# Patient Record
Sex: Male | Born: 1965 | Race: White | Hispanic: No | Marital: Married | Smoking: Never smoker
Health system: Southern US, Community
[De-identification: ages and names within clinical notes are randomized; demographics above are authoritative.]

## PROBLEM LIST (undated history)

## (undated) DIAGNOSIS — E669 Obesity, unspecified: Secondary | ICD-10-CM

## (undated) DIAGNOSIS — I10 Essential (primary) hypertension: Secondary | ICD-10-CM

## (undated) DIAGNOSIS — I2510 Atherosclerotic heart disease of native coronary artery without angina pectoris: Secondary | ICD-10-CM

## (undated) DIAGNOSIS — I219 Acute myocardial infarction, unspecified: Secondary | ICD-10-CM

## (undated) DIAGNOSIS — E785 Hyperlipidemia, unspecified: Secondary | ICD-10-CM

## (undated) DIAGNOSIS — I639 Cerebral infarction, unspecified: Secondary | ICD-10-CM

## (undated) DIAGNOSIS — M1990 Unspecified osteoarthritis, unspecified site: Secondary | ICD-10-CM

## (undated) DIAGNOSIS — E119 Type 2 diabetes mellitus without complications: Secondary | ICD-10-CM

## (undated) DIAGNOSIS — I209 Angina pectoris, unspecified: Secondary | ICD-10-CM

## (undated) HISTORY — PX: SHOULDER SURGERY: SHX246

## (undated) HISTORY — PX: TONSILLECTOMY: SUR1361

## (undated) HISTORY — PX: EYE SURGERY: SHX253

---

## 2019-07-29 ENCOUNTER — Other Ambulatory Visit: Payer: Self-pay

## 2019-07-29 DIAGNOSIS — Z20822 Contact with and (suspected) exposure to covid-19: Secondary | ICD-10-CM

## 2019-07-31 LAB — NOVEL CORONAVIRUS, NAA: SARS-CoV-2, NAA: NOT DETECTED

## 2020-12-16 ENCOUNTER — Other Ambulatory Visit: Payer: Self-pay

## 2020-12-16 ENCOUNTER — Emergency Department (HOSPITAL_BASED_OUTPATIENT_CLINIC_OR_DEPARTMENT_OTHER): Payer: 59

## 2020-12-16 ENCOUNTER — Inpatient Hospital Stay (HOSPITAL_BASED_OUTPATIENT_CLINIC_OR_DEPARTMENT_OTHER)
Admission: EM | Admit: 2020-12-16 | Discharge: 2020-12-25 | DRG: 234 | Disposition: A | Discharge status: Home / Self Care | Payer: 59 | Attending: Surgery | Admitting: Surgery

## 2020-12-16 ENCOUNTER — Encounter (HOSPITAL_COMMUNITY)
Admission: EM | Disposition: A | Discharge status: Home / Self Care | Payer: Self-pay | Source: Home / Self Care | Attending: Surgery

## 2020-12-16 ENCOUNTER — Encounter (HOSPITAL_BASED_OUTPATIENT_CLINIC_OR_DEPARTMENT_OTHER): Payer: Self-pay

## 2020-12-16 DIAGNOSIS — I214 Non-ST elevation (NSTEMI) myocardial infarction: Secondary | ICD-10-CM | POA: Diagnosis not present

## 2020-12-16 DIAGNOSIS — Z20822 Contact with and (suspected) exposure to covid-19: Secondary | ICD-10-CM | POA: Diagnosis not present

## 2020-12-16 DIAGNOSIS — R7989 Other specified abnormal findings of blood chemistry: Secondary | ICD-10-CM

## 2020-12-16 DIAGNOSIS — E8881 Metabolic syndrome: Secondary | ICD-10-CM | POA: Diagnosis not present

## 2020-12-16 DIAGNOSIS — Z833 Family history of diabetes mellitus: Secondary | ICD-10-CM | POA: Diagnosis not present

## 2020-12-16 DIAGNOSIS — E785 Hyperlipidemia, unspecified: Secondary | ICD-10-CM | POA: Diagnosis not present

## 2020-12-16 DIAGNOSIS — D62 Acute posthemorrhagic anemia: Secondary | ICD-10-CM | POA: Diagnosis not present

## 2020-12-16 DIAGNOSIS — R079 Chest pain, unspecified: Secondary | ICD-10-CM

## 2020-12-16 DIAGNOSIS — Z7982 Long term (current) use of aspirin: Secondary | ICD-10-CM

## 2020-12-16 DIAGNOSIS — I2510 Atherosclerotic heart disease of native coronary artery without angina pectoris: Secondary | ICD-10-CM | POA: Diagnosis not present

## 2020-12-16 DIAGNOSIS — E782 Mixed hyperlipidemia: Secondary | ICD-10-CM

## 2020-12-16 DIAGNOSIS — R001 Bradycardia, unspecified: Secondary | ICD-10-CM | POA: Diagnosis not present

## 2020-12-16 DIAGNOSIS — E1065 Type 1 diabetes mellitus with hyperglycemia: Secondary | ICD-10-CM | POA: Diagnosis present

## 2020-12-16 DIAGNOSIS — I10 Essential (primary) hypertension: Secondary | ICD-10-CM

## 2020-12-16 DIAGNOSIS — Z888 Allergy status to other drugs, medicaments and biological substances status: Secondary | ICD-10-CM | POA: Diagnosis not present

## 2020-12-16 DIAGNOSIS — M25512 Pain in left shoulder: Secondary | ICD-10-CM | POA: Diagnosis not present

## 2020-12-16 DIAGNOSIS — I77810 Thoracic aortic ectasia: Secondary | ICD-10-CM | POA: Diagnosis present

## 2020-12-16 DIAGNOSIS — Z91013 Allergy to seafood: Secondary | ICD-10-CM | POA: Diagnosis not present

## 2020-12-16 DIAGNOSIS — E119 Type 2 diabetes mellitus without complications: Secondary | ICD-10-CM

## 2020-12-16 DIAGNOSIS — Z6841 Body Mass Index (BMI) 40.0 and over, adult: Secondary | ICD-10-CM | POA: Diagnosis not present

## 2020-12-16 DIAGNOSIS — Z951 Presence of aortocoronary bypass graft: Secondary | ICD-10-CM

## 2020-12-16 DIAGNOSIS — Z9641 Presence of insulin pump (external) (internal): Secondary | ICD-10-CM | POA: Diagnosis present

## 2020-12-16 DIAGNOSIS — Z79899 Other long term (current) drug therapy: Secondary | ICD-10-CM | POA: Diagnosis not present

## 2020-12-16 DIAGNOSIS — Z8249 Family history of ischemic heart disease and other diseases of the circulatory system: Secondary | ICD-10-CM

## 2020-12-16 DIAGNOSIS — Z7984 Long term (current) use of oral hypoglycemic drugs: Secondary | ICD-10-CM | POA: Diagnosis not present

## 2020-12-16 DIAGNOSIS — Z794 Long term (current) use of insulin: Secondary | ICD-10-CM

## 2020-12-16 DIAGNOSIS — I6523 Occlusion and stenosis of bilateral carotid arteries: Secondary | ICD-10-CM | POA: Diagnosis not present

## 2020-12-16 DIAGNOSIS — E1051 Type 1 diabetes mellitus with diabetic peripheral angiopathy without gangrene: Secondary | ICD-10-CM

## 2020-12-16 DIAGNOSIS — E1069 Type 1 diabetes mellitus with other specified complication: Secondary | ICD-10-CM | POA: Diagnosis not present

## 2020-12-16 DIAGNOSIS — R778 Other specified abnormalities of plasma proteins: Secondary | ICD-10-CM

## 2020-12-16 HISTORY — DX: Hyperlipidemia, unspecified: E78.5

## 2020-12-16 HISTORY — DX: Cerebral infarction, unspecified: I63.9

## 2020-12-16 HISTORY — DX: Type 2 diabetes mellitus without complications: E11.9

## 2020-12-16 HISTORY — DX: Essential (primary) hypertension: I10

## 2020-12-16 HISTORY — PX: LEFT HEART CATH AND CORONARY ANGIOGRAPHY: CATH118249

## 2020-12-16 HISTORY — DX: Obesity, unspecified: E66.9

## 2020-12-16 LAB — CBC
HCT: 44.8 % (ref 39.0–52.0)
Hemoglobin: 14.5 g/dL (ref 13.0–17.0)
MCH: 27.8 pg (ref 26.0–34.0)
MCHC: 32.4 g/dL (ref 30.0–36.0)
MCV: 85.8 fL (ref 80.0–100.0)
Platelets: 299 10*3/uL (ref 150–400)
RBC: 5.22 MIL/uL (ref 4.22–5.81)
RDW: 13.6 % (ref 11.5–15.5)
WBC: 11.3 10*3/uL — ABNORMAL HIGH (ref 4.0–10.5)
nRBC: 0 % (ref 0.0–0.2)

## 2020-12-16 LAB — BASIC METABOLIC PANEL
Anion gap: 13 (ref 5–15)
BUN: 28 mg/dL — ABNORMAL HIGH (ref 6–20)
CO2: 21 mmol/L — ABNORMAL LOW (ref 22–32)
Calcium: 9.9 mg/dL (ref 8.9–10.3)
Chloride: 104 mmol/L (ref 98–111)
Creatinine, Ser: 0.87 mg/dL (ref 0.61–1.24)
GFR, Estimated: >60 mL/min (ref >60)
Glucose, Bld: 249 mg/dL — ABNORMAL HIGH (ref 70–99)
Potassium: 3.9 mmol/L (ref 3.5–5.1)
Sodium: 138 mmol/L (ref 135–145)

## 2020-12-16 LAB — RESP PANEL BY RT-PCR (FLU A&B, COVID) ARPGX2
Influenza A by PCR: NEGATIVE
Influenza B by PCR: NEGATIVE
SARS Coronavirus 2 by RT PCR: NEGATIVE

## 2020-12-16 LAB — HIV ANTIBODY (ROUTINE TESTING W REFLEX): HIV Screen 4th Generation wRfx: NONREACTIVE

## 2020-12-16 LAB — CBG MONITORING, ED: Glucose-Capillary: 278 mg/dL — ABNORMAL HIGH (ref 70–99)

## 2020-12-16 LAB — HEMOGLOBIN A1C
Hgb A1c MFr Bld: 8.3 % — ABNORMAL HIGH (ref 4.8–5.6)
Mean Plasma Glucose: 191.51 mg/dL

## 2020-12-16 LAB — GLUCOSE, CAPILLARY
Glucose-Capillary: 270 mg/dL — ABNORMAL HIGH (ref 70–99)
Glucose-Capillary: 325 mg/dL — ABNORMAL HIGH (ref 70–99)

## 2020-12-16 LAB — TROPONIN I (HIGH SENSITIVITY)
Troponin I (High Sensitivity): 32 ng/L — ABNORMAL HIGH (ref <18)
Troponin I (High Sensitivity): 893 ng/L (ref <18)

## 2020-12-16 SURGERY — LEFT HEART CATH AND CORONARY ANGIOGRAPHY
Anesthesia: LOCAL

## 2020-12-16 MED ORDER — HEPARIN (PORCINE) 25000 UT/250ML-% IV SOLN
1300.0000 [IU]/h | INTRAVENOUS | Status: DC
  Administered 2020-12-16: 1300 [IU]/h via INTRAVENOUS
  Filled 2020-12-16: qty 250

## 2020-12-16 MED ORDER — HEPARIN BOLUS VIA INFUSION
4000.0000 [IU] | Freq: Once | INTRAVENOUS | Status: AC
  Administered 2020-12-16: 4000 [IU] via INTRAVENOUS

## 2020-12-16 MED ORDER — SODIUM CHLORIDE 0.9% FLUSH: 3.0000 mL | INTRAVENOUS | Status: DC | PRN

## 2020-12-16 MED ORDER — ATORVASTATIN CALCIUM 40 MG PO TABS: 40.0000 mg | ORAL_TABLET | Freq: Every day | ORAL | Status: DC

## 2020-12-16 MED ORDER — MIDAZOLAM HCL 2 MG/2ML IJ SOLN
INTRAMUSCULAR | Status: DC | PRN
  Administered 2020-12-16: 2 mg via INTRAVENOUS

## 2020-12-16 MED ORDER — ASPIRIN EC 325 MG PO TBEC
325.0000 mg | DELAYED_RELEASE_TABLET | Freq: Once | ORAL | Status: DC
  Filled 2020-12-16: qty 1

## 2020-12-16 MED ORDER — SODIUM CHLORIDE 0.9% FLUSH: 3.0000 mL | Freq: Two times a day (BID) | INTRAVENOUS | Status: DC

## 2020-12-16 MED ORDER — ASPIRIN EC 81 MG PO TBEC: 81.0000 mg | DELAYED_RELEASE_TABLET | Freq: Every day | ORAL | Status: DC

## 2020-12-16 MED ORDER — SODIUM CHLORIDE 0.9 % IV SOLN: 250.0000 mL | INTRAVENOUS | Status: DC | PRN

## 2020-12-16 MED ORDER — SODIUM CHLORIDE 0.9 % WEIGHT BASED INFUSION: 3.0000 mL/kg/h | INTRAVENOUS | Status: DC

## 2020-12-16 MED ORDER — ONDANSETRON HCL 4 MG/2ML IJ SOLN: 4.0000 mg | Freq: Four times a day (QID) | INTRAMUSCULAR | Status: DC | PRN

## 2020-12-16 MED ORDER — IOHEXOL 350 MG/ML SOLN
INTRAVENOUS | Status: DC | PRN
  Administered 2020-12-16: 55 mL

## 2020-12-16 MED ORDER — ACETAMINOPHEN 325 MG PO TABS: 650.0000 mg | ORAL_TABLET | ORAL | Status: DC | PRN

## 2020-12-16 MED ORDER — HEPARIN (PORCINE) IN NACL 1000-0.9 UT/500ML-% IV SOLN
INTRAVENOUS | Status: DC | PRN
  Administered 2020-12-16 (×2): 500 mL

## 2020-12-16 MED ORDER — VERAPAMIL HCL 2.5 MG/ML IV SOLN
INTRAVENOUS | Status: DC | PRN
  Administered 2020-12-16: 10 mL via INTRA_ARTERIAL

## 2020-12-16 MED ORDER — HEPARIN (PORCINE) IN NACL 1000-0.9 UT/500ML-% IV SOLN
INTRAVENOUS | Status: AC
  Filled 2020-12-16: qty 1000

## 2020-12-16 MED ORDER — ASPIRIN 81 MG PO CHEW
CHEWABLE_TABLET | ORAL | Status: AC
  Administered 2020-12-16: 243 mg via ORAL
  Filled 2020-12-16: qty 3

## 2020-12-16 MED ORDER — FENTANYL CITRATE (PF) 100 MCG/2ML IJ SOLN
INTRAMUSCULAR | Status: AC
  Filled 2020-12-16: qty 2

## 2020-12-16 MED ORDER — ASPIRIN 81 MG PO CHEW: 243.0000 mg | CHEWABLE_TABLET | Freq: Once | ORAL | Status: AC

## 2020-12-16 MED ORDER — INSULIN ASPART 100 UNIT/ML IJ SOLN: 0.0000 [IU] | Freq: Three times a day (TID) | INTRAMUSCULAR | Status: DC

## 2020-12-16 MED ORDER — MIDAZOLAM HCL 2 MG/2ML IJ SOLN
INTRAMUSCULAR | Status: AC
  Filled 2020-12-16: qty 2

## 2020-12-16 MED ORDER — LIDOCAINE HCL (PF) 1 % IJ SOLN
INTRAMUSCULAR | Status: AC
  Filled 2020-12-16: qty 30

## 2020-12-16 MED ORDER — INSULIN ASPART 100 UNIT/ML IJ SOLN
0.0000 [IU] | Freq: Three times a day (TID) | INTRAMUSCULAR | Status: DC
  Administered 2020-12-16: 8 [IU] via SUBCUTANEOUS

## 2020-12-16 MED ORDER — LIDOCAINE HCL (PF) 1 % IJ SOLN
INTRAMUSCULAR | Status: DC | PRN
Start: 2020-12-16 — End: 2020-12-16
  Administered 2020-12-16: 3 mL

## 2020-12-16 MED ORDER — INSULIN PUMP
Freq: Three times a day (TID) | SUBCUTANEOUS | Status: DC
  Filled 2020-12-16: qty 1

## 2020-12-16 MED ORDER — FENTANYL CITRATE (PF) 100 MCG/2ML IJ SOLN
INTRAMUSCULAR | Status: DC | PRN
  Administered 2020-12-16: 50 ug via INTRAVENOUS

## 2020-12-16 MED ORDER — HEPARIN (PORCINE) 25000 UT/250ML-% IV SOLN
2200.0000 [IU]/h | INTRAVENOUS | Status: DC
  Administered 2020-12-17: 1600 [IU]/h via INTRAVENOUS
  Administered 2020-12-17: 1300 [IU]/h via INTRAVENOUS
  Administered 2020-12-18 – 2020-12-19 (×4): 2200 [IU]/h via INTRAVENOUS
  Filled 2020-12-16 (×6): qty 250

## 2020-12-16 MED ORDER — NITROGLYCERIN 2 % TD OINT
0.5000 [in_us] | TOPICAL_OINTMENT | Freq: Once | TRANSDERMAL | Status: AC
  Administered 2020-12-16: 0.5 [in_us] via TOPICAL
  Filled 2020-12-16: qty 1

## 2020-12-16 MED ORDER — ASPIRIN EC 81 MG PO TBEC
81.0000 mg | DELAYED_RELEASE_TABLET | Freq: Every day | ORAL | Status: DC
  Administered 2020-12-17 – 2020-12-19 (×3): 81 mg via ORAL
  Filled 2020-12-16 (×3): qty 1

## 2020-12-16 MED ORDER — SODIUM CHLORIDE 0.9 % IV SOLN
INTRAVENOUS | Status: DC | PRN
  Administered 2020-12-16: 1000 mL via INTRAVENOUS

## 2020-12-16 MED ORDER — HEPARIN SODIUM (PORCINE) 1000 UNIT/ML IJ SOLN
INTRAMUSCULAR | Status: DC | PRN
  Administered 2020-12-16: 7000 [IU] via INTRAVENOUS

## 2020-12-16 MED ORDER — HYDRALAZINE HCL 20 MG/ML IJ SOLN: 10.0000 mg | INTRAMUSCULAR | Status: AC | PRN

## 2020-12-16 MED ORDER — ACETAMINOPHEN 325 MG PO TABS
650.0000 mg | ORAL_TABLET | ORAL | Status: DC | PRN
  Filled 2020-12-16: qty 2

## 2020-12-16 MED ORDER — ASPIRIN 81 MG PO CHEW: 81.0000 mg | CHEWABLE_TABLET | Freq: Every day | ORAL | Status: DC

## 2020-12-16 MED ORDER — HEPARIN SODIUM (PORCINE) 1000 UNIT/ML IJ SOLN
INTRAMUSCULAR | Status: AC
  Filled 2020-12-16: qty 1

## 2020-12-16 MED ORDER — HEPARIN (PORCINE) 25000 UT/250ML-% IV SOLN: 12.0000 [IU]/kg/h | INTRAVENOUS | Status: DC

## 2020-12-16 MED ORDER — ASPIRIN 81 MG PO TBEC: 81.0000 mg | DELAYED_RELEASE_TABLET | Freq: Every day | ORAL | Status: DC

## 2020-12-16 MED ORDER — ASPIRIN 81 MG PO CHEW: 81.0000 mg | CHEWABLE_TABLET | ORAL | Status: DC

## 2020-12-16 MED ORDER — SODIUM CHLORIDE 0.9 % WEIGHT BASED INFUSION: 1.0000 mL/kg/h | INTRAVENOUS | Status: DC

## 2020-12-16 MED ORDER — VERAPAMIL HCL 2.5 MG/ML IV SOLN
INTRAVENOUS | Status: AC
  Filled 2020-12-16: qty 2

## 2020-12-16 MED ORDER — LABETALOL HCL 5 MG/ML IV SOLN: 10.0000 mg | INTRAVENOUS | Status: AC | PRN

## 2020-12-16 MED ORDER — INSULIN ASPART 100 UNIT/ML IJ SOLN
0.0000 [IU] | Freq: Every day | INTRAMUSCULAR | Status: DC
  Administered 2020-12-16: 5 [IU] via SUBCUTANEOUS
  Administered 2020-12-17: 3 [IU] via SUBCUTANEOUS

## 2020-12-16 MED ORDER — NITROGLYCERIN 0.4 MG SL SUBL
0.4000 mg | SUBLINGUAL_TABLET | SUBLINGUAL | Status: DC | PRN
  Administered 2020-12-18 – 2020-12-19 (×3): 0.4 mg via SUBLINGUAL
  Filled 2020-12-16: qty 1

## 2020-12-16 MED ORDER — ASPIRIN EC 81 MG PO TBEC: 243.0000 mg | DELAYED_RELEASE_TABLET | Freq: Once | ORAL | Status: DC

## 2020-12-16 SURGICAL SUPPLY — 12 items
CATH 5FR JL3.5 JR4 ANG PIG MP (CATHETERS) ×2 IMPLANT
DEVICE RAD COMP TR BAND LRG (VASCULAR PRODUCTS) ×2 IMPLANT
GLIDESHEATH SLEND SS 6F .021 (SHEATH) ×2 IMPLANT
GUIDEWIRE INQWIRE 1.5J.035X260 (WIRE) ×1 IMPLANT
INQWIRE 1.5J .035X260CM (WIRE) ×2
KIT HEART LEFT (KITS) ×2 IMPLANT
MAT PREVALON FULL STRYKER (MISCELLANEOUS) ×2 IMPLANT
PACK CARDIAC CATHETERIZATION (CUSTOM PROCEDURE TRAY) ×2 IMPLANT
SHEATH PROBE COVER 6X72 (BAG) ×2 IMPLANT
SYR MEDRAD MARK 7 150ML (SYRINGE) ×2 IMPLANT
TRANSDUCER W/STOPCOCK (MISCELLANEOUS) ×2 IMPLANT
TUBING CIL FLEX 10 FLL-RA (TUBING) ×2 IMPLANT

## 2020-12-16 NOTE — ED Notes (Signed)
ED Provider at bedside. 

## 2020-12-16 NOTE — Progress Notes (Signed)
Messaged Dr. Rennis Golden concerning orders. He is going to review orders.

## 2020-12-16 NOTE — H&P (Addendum)
History & Physical    Patient ID: Timothy Golden MRN: [id number], DOB/AGE: March 17, 1966   Admit date: 12/16/2020 Primary Physician: Timothy Golden Timothy Golden Primary Cardiologist: Timothy Golden   Patient Profile    Timothy Golden is a 55yo M with a hx of DM1, HTN, HLD, and morbid obesity with a BMI > 40 who presented to [facility] 12/16/20 with chest pain.  Past Medical History   Past Medical History:  Diagnosis Date  . Diabetes mellitus without complication (Timothy Golden)   . HLD (hyperlipidemia)   . Hypertension   . Obesity   . Stroke [facility])     The histories are not reviewed yet. Please review them in the "History" navigator section and refresh this SmartLink.   Allergies  Allergies  Allergen Reactions  . Ace Inhibitors     Other reaction(s): Cough (ALLERGY/intolerance)  . Shellfish Allergy Rash    hives   History of Present Illness    Initial onset of chest pain noted to be approximately 2 hours prior to Golden arrival described as a heaviness with pressure and tightness that began after getting up this AM. He has had no exertional qualities and no radiation or associated symptoms such as nausea, vomiting or SOB. He has no prior personal or family hx of CAD. He denies tobacco or illicit drug use. Given his symptoms, he presented for further evaluation.   On Golden arrival, EKG with SB with HR 54bpm and no acute changes, with IVCD. HsT initially found to only be mildly elevated at 32 however on second draw found to be 893. He continues to have mild chest discomfort on cath lab holding arrival. Glucose elevated at 249 and WBC mildly elevated at 11.3. He denies recent s/s of acute infection such as fever or chills. COVID test was negative.   Given abrupt HsT elevation, he was transferred to Timothy Golden for Timothy Golden today.   Home Medications    Prior to Admission medications   Medication Sig Start Date End Date Taking? Authorizing Provider  Continuous Blood Gluc Sensor (DEXCOM G6 SENSOR) MISC Apply 1 sensor to  the skin every 10 days for continuous glucose monitoring. 09/28/20  Yes [provider]  diltiazem (TIAZAC) 240 MG 24 hr capsule Take by mouth. 12/13/20  Yes [provider]  aspirin 81 MG EC tablet Take by mouth.    [provider]  atenolol (TENORMIN) 100 MG tablet Take 100 mg by mouth daily. 11/13/20   [provider]  atorvastatin (LIPITOR) 40 MG tablet Take 1 tablet by mouth daily. 12/13/20   [provider]  hydrochlorothiazide (HYDRODIURIL) 25 MG tablet Take 1 tablet by mouth daily. 10/01/20   [provider]  losartan (COZAAR) 100 MG tablet Take 1 tablet by mouth daily. 10/21/20   [provider]  metFORMIN (GLUCOPHAGE-XR) 500 MG 24 hr tablet Take 1,000 mg by mouth 2 (two) times daily. 12/13/20   [provider]  NOVOLOG 100 UNIT/ML injection SMARTSIG:72 Unit(s) SUB-Q 3 Times Daily 06/22/20   [provider]  TRESIBA FLEXTOUCH 200 UNIT/ML FlexTouch Pen SMARTSIG:65 Unit(s) SUB-Q Morning-Night 08/17/20   [provider]   Family History    Family History  Problem Relation Age of Onset  . Hypertension Father     Social History    Social History   Socioeconomic History  . Marital status: Married    Spouse name: Not on file  . Number of children: Not on file  . Years of education: Not on  file  . Highest education level: Not on file  Occupational History  . Not on file  Tobacco Use  . Smoking status: Never Smoker  . Smokeless tobacco: Never Used  Substance and Sexual Activity  . Alcohol use: Never  . Drug use: Never  . Sexual activity: Not on file  Other Topics Concern  . Not on file  Social History Narrative  . Not on file   Social Determinants of Health   Financial Resource Strain: Not on file  Food Insecurity: Not on file  Transportation Needs: Not on file  Physical Activity: Not on file  Stress: Not on file  Social Connections: Not on file  Intimate Partner Violence: Not on file     Review of Systems   See HPI. All other systems reviewed and are otherwise negative except as noted above.  Physical Exam    Blood pressure (!) 160/69, pulse (!) 58, temperature 98.1 F (36.7 C), temperature source Oral, resp. rate 18, height 6' (1.829 m), weight 136.1 kg, SpO2 98 %.   General: Obese, NAD Neck: Negative for carotid bruits. No JVD Lungs:Clear to ausculation bilaterally. Breathing is unlabored. Cardiovascular: RRR with S1 S2. No murmurs Abdomen: Soft, non-tender, distended. No obvious abdominal masses. Extremities: No edema. Radial pulses 2+ bilaterally Neuro: Alert and oriented. No focal deficits. No facial asymmetry. MAE spontaneously. Psych: Responds to questions appropriately with normal affect.    Labs    Troponin (Point of Care Test) No results for input(s): TROPIPOC in the last 72 hours. No results for input(s): CKTOTAL, CKMB, TROPONINI in the last 72 hours. Lab Results  Component Value Date   WBC 11.3 (H) 12/16/2020   HGB 14.5 12/16/2020   HCT 44.8 12/16/2020   MCV 85.8 12/16/2020   PLT 299 12/16/2020    Recent Labs  Lab 12/16/20 0833  NA 138  K 3.9  CL 104  CO2 21*  BUN 28*  CREATININE 0.87  CALCIUM 9.9  GLUCOSE 249*   No results found for: CHOL, HDL, LDLCALC, TRIG No results found for: [facility]   Radiology Studies    DG Chest Port 1 View  Result Date: 12/16/2020 CLINICAL DATA:  Chest pain and pressure beginning today. EXAM: PORTABLE CHEST 1 VIEW COMPARISON:  None. FINDINGS: Artifact overlies the chest. Heart size is normal. Mediastinal shadows are normal. The lungs are clear. No edema or effusion. No focal bone finding. IMPRESSION: No active disease. Electronically Signed   By: Paulina Fusi M.D.   On: 12/16/2020 08:49   ECG & Cardiac Imaging    12/16/20: SB with HR 54bpm with IVCD and no acute changes   Assessment & Plan    1. NSTEMI: -Presented to [facility] 12/16/20 with chest pain found to have an elevated HsT from 32>>893. EKG stable  with no acute changes. Plan was for transfer and admission to Timothy Golden for Timothy Golden for further coronary evaluation.  -Home medications include ASA, atorvastatin, atenolol -Hold atenolol due to bradycardia -Continue ASA, statin post cath   2. DM1: -Follows closely with OP endocrinology  -On home metformin, insulin pump  -Will order home insulin pump order set for patient to self dose  -CBG Q4H -Obtain HbA1c  3. HTN: -Stable, 156/66>>145/62>>142/60 -Restart home medications losartan 100mg  QD in the post cath setting  -Hold home atenolol 100, diltiazem 240  4. HLD: -Unable to find most recent Lipid panel in care everywhere -Obtain updated Lipid panel with LFTs -Restart home atorvastatin   5. Morbid obesity: -BMI >40kg/m2  Severity of Illness: The appropriate patient status for this patient is OBSERVATION. Observation status is judged to be reasonable and necessary in order to provide the required intensity of service to ensure the patient's safety. The patient's presenting symptoms, physical exam findings, and initial radiographic and laboratory data in the context of their medical condition is felt to place them at decreased risk for further clinical deterioration. Furthermore, it is anticipated that the patient will be medically stable for discharge from the Golden within 2 midnights of admission. The following factors support the patient status of observation.   " The patient's presenting symptoms include chest pain. " The physical exam findings include chest pain. " The initial radiographic and laboratory data are elevated troponin.    Signed, Georgie Chard NP-C [facility] Pager: [phone] 12/16/2020, @NOW    I have examined the patient and reviewed assessment and plan and discussed with patient.  Agree with above as stated.    Chest pain.  Multiple RF for CAD.  Plan for cath.  All questions answered.  Further plans based on cath results. Aggressive RF modification needed as  well. Weight loss, DM control.  He will need high potency statin.  Whole fod, plant based diet to be recommended as well.   

## 2020-12-16 NOTE — Progress Notes (Addendum)
Paged cardiology. Waiting for return call.

## 2020-12-16 NOTE — Progress Notes (Signed)
ANTICOAGULATION CONSULT NOTE - Follow-Up Consult  Pharmacy Consult for heparin Indication: chest pain/ACS  Patient Measurements: Height: 6' (182.9 cm) Weight: 136.1 kg (300 lb) IBW/kg (Calculated) : 77.6 Heparin Dosing Weight: 108.7kg  Vital Signs: Temp: 98.1 F (36.7 C) (04/28 1755) Temp Source: Oral (04/28 1755) BP: 166/55 (04/28 1755) Pulse Rate: 58 (04/28 1620)  Labs: Recent Labs    12/16/20 0833 12/16/20 1054  HGB 14.5  --   HCT 44.8  --   PLT 299  --   CREATININE 0.87  --   TROPONINIHS 32* 893*    Estimated Creatinine Clearance: 137.1 mL/min (by C-G formula based on SCr of 0.87 mg/dL).   Medications:  Infusions:  . sodium chloride 10 mL/hr at 12/16/20 1732    Assessment: 12 YOM who presented with CP/NSTEMI and is now s/p cath which showed complex distal main disease - consulting CVTS. Pharmacy consulted to resume Heparin 8 hours after the sheath removal.   The patient's sheath was removed at 1703.   Goal of Therapy:  Heparin level 0.3-0.7 units/ml Monitor platelets by anticoagulation protocol: Yes   Plan:  - Restart Heparin at a rate of 1300 units/hr at 0100 on 4/29 AM - Will continue to monitor for any signs/symptoms of bleeding and will follow up with heparin level in 6 hours after start  Thank you for allowing pharmacy to be a part of this patient's care.  Georgina Pillion, PharmD, BCPS Clinical Pharmacist Clinical phone for 12/16/2020: [phone] 12/16/2020 7:44 PM   **Pharmacist phone directory can now be found on [email or web address] ([email or web address]).  Listed under [facility].

## 2020-12-16 NOTE — ED Triage Notes (Signed)
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 Pt states pain is sharp and central with no radiation.

## 2020-12-16 NOTE — ED Notes (Signed)
Report given to [facility]. 

## 2020-12-16 NOTE — Progress Notes (Addendum)
Inpatient Diabetes Program Recommendations  AACE/ADA: New Consensus Statement on Inpatient Glycemic Control (2015)  Target Ranges:  Prepandial:   less than 140 mg/dL      Peak postprandial:   less than 180 mg/dL (1-2 hours)      Critically ill patients:  140 - 180 mg/dL   Lab Results  Component Value Date   GLUCAP 278 (H) 12/16/2020    Review of Glycemic Control  Diabetes history: type 1 Outpatient Diabetes medications: Tresiba 65 units at HS, Novolog 72 units TID, Metformin 1000 mg BID Current orders for Inpatient glycemic control: Novolog RESISTANT correction scale TID  Inpatient Diabetes Program Recommendations:   Received diabetes coordinator consult. Noted the home insulin dosages.  Recommend starting:  Lantus 50 units at HS (80% of home dose of Tresiba) Novolog RESISTANT correction scale every 4 hours while NPO and then TID when eating. Add 0-5 units HS scale when eating. Novolog 10 units TID with meals when eating.   Smith Mince RN BSN CDE Diabetes Coordinator Pager: [phone]  8am-5pm

## 2020-12-16 NOTE — Interval H&P Note (Signed)
Cath Lab Visit (complete for each Cath Lab visit)  Clinical Evaluation Leading to the Procedure:   ACS: Yes.    Non-ACS:    Anginal Classification: CCS IV  Anti-ischemic medical therapy: Minimal Therapy (1 class of medications)  Non-Invasive Test Results: No non-invasive testing performed  Prior CABG: No previous CABG      History and Physical Interval Note:  12/16/2020 4:42 PM  Timothy Golden  has presented today for surgery, with the diagnosis of NSTEMI.  The various methods of treatment have been discussed with the patient and family. After consideration of risks, benefits and other options for treatment, the patient has consented to  Procedure(s): LEFT HEART CATH AND CORONARY ANGIOGRAPHY (N/A) as a surgical intervention.  The patient's history has been reviewed, patient examined, no change in status, stable for surgery.  I have reviewed the patient's chart and labs.  Questions were answered to the patient's satisfaction.     Lance Muss

## 2020-12-16 NOTE — ED Provider Notes (Signed)
[facility] EMERGENCY DEPT Provider Note   CSN: [id number] Arrival date & time: 12/16/20  3818     History Chief Complaint  Patient presents with  . Chest Pain    Timothy Golden is a 55 y.o. male.   Chest Pain Associated symptoms: no abdominal pain, no back pain, no cough, no fever, no palpitations, no shortness of breath and no vomiting     HPI: A 55 year old patient with a history of CVA, treated diabetes, hypertension, hypercholesterolemia and obesity presents for evaluation of chest pain. Initial onset of pain was approximately 1-3 hours ago. The patient's chest pain is described as heaviness/pressure/tightness and is not worse with exertion. The patient's chest pain is middle- or left-sided, is not well-localized, is not sharp and does not radiate to the arms/jaw/neck. The patient does not complain of nausea and denies diaphoresis. The patient has no history of peripheral artery disease, has not smoked in the past 90 days and has no relevant family history of coronary artery disease (first degree relative at less than age 64).   Past Medical History:  Diagnosis Date  . Diabetes mellitus without complication (HCC)   . Hypertension   . Stroke [facility])     There are no problems to display for this patient.   No family history on file.  Social History   Tobacco Use  . Smoking status: Never Smoker  . Smokeless tobacco: Never Used  Substance Use Topics  . Alcohol use: Never  . Drug use: Never    Home Medications Prior to Admission medications   Medication Sig Start Date End Date Taking? Authorizing Provider  Continuous Blood Gluc Sensor (DEXCOM G6 SENSOR) MISC Apply 1 sensor to the skin every 10 days for continuous glucose monitoring. 09/28/20  Yes [provider]  diltiazem (TIAZAC) 240 MG 24 hr capsule Take by mouth. 12/13/20  Yes [provider]  aspirin 81 MG EC tablet Take by mouth.    [provider]  atenolol (TENORMIN) 100 MG  tablet Take 100 mg by mouth daily. 11/13/20   [provider]  atorvastatin (LIPITOR) 40 MG tablet Take 1 tablet by mouth daily. 12/13/20   [provider]  hydrochlorothiazide (HYDRODIURIL) 25 MG tablet Take 1 tablet by mouth daily. 10/01/20   [provider]  losartan (COZAAR) 100 MG tablet Take 1 tablet by mouth daily. 10/21/20   [provider]  metFORMIN (GLUCOPHAGE-XR) 500 MG 24 hr tablet Take 1,000 mg by mouth 2 (two) times daily. 12/13/20   [provider]  NOVOLOG 100 UNIT/ML injection SMARTSIG:72 Unit(s) SUB-Q 3 Times Daily 06/22/20   [provider]  TRESIBA FLEXTOUCH 200 UNIT/ML FlexTouch Pen SMARTSIG:65 Unit(s) SUB-Q Morning-Night 08/17/20   [provider]    Allergies    Ace inhibitors and Shellfish allergy  Review of Systems   Review of Systems  Constitutional: Negative for chills and fever.  HENT: Negative for ear pain and sore throat.   Eyes: Negative for pain and visual disturbance.  Respiratory: Negative for cough and shortness of breath.   Cardiovascular: Positive for chest pain. Negative for palpitations.  Gastrointestinal: Negative for abdominal pain and vomiting.  Genitourinary: Negative for dysuria and hematuria.  Musculoskeletal: Negative for arthralgias and back pain.  Skin: Negative for color change and rash.  Neurological: Negative for seizures and syncope.  All other systems reviewed and are negative.   Physical Exam Updated Vital Signs BP (!) 173/67 (BP Location: Right Arm)   Pulse 63   Temp  98.1 F (36.7 C) (Oral)   Resp 16   Ht 6' (1.829 m)   Wt 136.1 kg   SpO2 95%   BMI 40.69 kg/m   Physical Exam Vitals and nursing note reviewed.  Constitutional:      Appearance: He is well-developed.  HENT:     Head: Normocephalic and atraumatic.  Eyes:     Conjunctiva/sclera: Conjunctivae normal.  Cardiovascular:     Rate and Rhythm: Normal rate and regular rhythm.     Heart sounds: No murmur  heard.   Pulmonary:     Effort: Pulmonary effort is normal. No respiratory distress.     Breath sounds: Normal breath sounds.  Abdominal:     Palpations: Abdomen is soft.     Tenderness: There is no abdominal tenderness.  Musculoskeletal:     Cervical back: Neck supple.  Skin:    General: Skin is warm and dry.  Neurological:     General: No focal deficit present.     Mental Status: He is alert.  Psychiatric:        Mood and Affect: Mood normal.     ED Results / Procedures / Treatments   Labs (all labs ordered are listed, but only abnormal results are displayed) Labs Reviewed  BASIC METABOLIC PANEL - Abnormal; Notable for the following components:      Result Value   CO2 21 (*)    Glucose, Bld 249 (*)    BUN 28 (*)    All other components within normal limits  CBC - Abnormal; Notable for the following components:   WBC 11.3 (*)    All other components within normal limits  CBG MONITORING, ED - Abnormal; Notable for the following components:   Glucose-Capillary 278 (*)    All other components within normal limits  TROPONIN I (HIGH SENSITIVITY) - Abnormal; Notable for the following components:   Troponin I (High Sensitivity) 32 (*)    All other components within normal limits  TROPONIN I (HIGH SENSITIVITY) - Abnormal; Notable for the following components:   Troponin I (High Sensitivity) 893 (*)    All other components within normal limits  RESP PANEL BY RT-PCR (FLU A&B, COVID) [id number]  HEPARIN LEVEL (UNFRACTIONATED)    EKG EKG Interpretation  Date/Time:  Thursday December 16 2020 08:17:45 EDT Ventricular Rate:  54 PR Interval:  217 QRS Duration: 116 QT Interval:  453 QTC Calculation: 430 R Axis:   -3 Text Interpretation: Sinus rhythm Prolonged PR interval Nonspecific intraventricular conduction delay axis normal no acute ischemia Confirmed by Pieter Partridge ([id number]) on 12/16/2020 8:35:53 AM   Radiology DG Chest Port 1 View  Result Date: 12/16/2020 CLINICAL DATA:   Chest pain and pressure beginning today. EXAM: PORTABLE CHEST 1 VIEW COMPARISON:  None. FINDINGS: Artifact overlies the chest. Heart size is normal. Mediastinal shadows are normal. The lungs are clear. No edema or effusion. No focal bone finding. IMPRESSION: No active disease. Electronically Signed   By: Paulina Fusi M.D.   On: 12/16/2020 08:49    Procedures .Critical Care Performed by: Koleen Distance, MD Authorized by: Koleen Distance, MD   Critical care provider statement:    Critical care time (minutes):  45   Critical care time was exclusive of:  Separately billable procedures and treating other patients and teaching time   Critical care was necessary to treat or prevent imminent or life-threatening deterioration of the following conditions:  Cardiac failure   Critical care was time spent personally by me  on the following activities:  Discussions with consultants, evaluation of patient's response to treatment, examination of patient, ordering and performing treatments and interventions, ordering and review of laboratory studies, ordering and review of radiographic studies, pulse oximetry, re-evaluation of patient's condition, obtaining history from patient or surrogate, review of old charts and development of treatment plan with patient or surrogate   I assumed direction of critical care for this patient from another provider in my specialty: no     Care discussed with: admitting provider       Medications Ordered in ED Medications  heparin ADULT infusion 100 units/mL (25000 units/255mL) (1,300 Units/hr Intravenous New Bag/Given 12/16/20 1238)  0.9 %  sodium chloride infusion (1,000 mLs Intravenous New Bag/Given 12/16/20 1234)  insulin pump (has no administration in time range)  aspirin chewable tablet 243 mg (243 mg Oral Given 12/16/20 0845)  nitroGLYCERIN (NITROGLYN) 2 % ointment 0.5 inch (0.5 inches Topical Given 12/16/20 1240)  heparin bolus via infusion 4,000 Units (4,000 Units  Intravenous Bolus from Bag 12/16/20 1238)    ED Course  I have reviewed the triage vital signs and the nursing notes.  Pertinent labs & imaging results that were available during my care of the patient were reviewed by me and considered in my medical decision making (see chart for details).  Clinical Course as of 12/16/20 1218  Thu Dec 16, 2020  1216 I spoke with cardiology. Recommend NPO for possible cath after transfer [AW]    Clinical Course User Index [AW] Koleen Distance, MD   MDM Rules/Calculators/A&P HEAR Score: 4                        Timothy Golden resented to the ED with chest pain.  He has multiple cardiac risk factors.  He was evaluated for possible ACS.  Initial troponin was very mildly elevated, and repeat troponin was certainly diagnostic for an NSTEMI.  Nitropaste was applied, and he was given heparin. Final Clinical Impression(s) / ED Diagnoses Final diagnoses:  NSTEMI (non-ST elevated myocardial infarction) (HCC)  Type 2 diabetes mellitus without complication, with long-term current use of insulin (HCC)  Class 3 severe obesity without serious comorbidity with body mass index (BMI) of 40.0 to 44.9 in adult, unspecified obesity type (HCC)  Primary hypertension  Hyperlipidemia, unspecified hyperlipidemia type    Rx / DC Orders ED Discharge Orders    None       Koleen Distance, MD 12/16/20 1541

## 2020-12-16 NOTE — Progress Notes (Signed)
ANTICOAGULATION CONSULT NOTE - Initial Consult  Pharmacy Consult for heparin Indication: chest pain/ACS  Allergies  Allergen Reactions  . Ace Inhibitors     Other reaction(s): Cough (ALLERGY/intolerance)  . Shellfish Allergy Rash    hives    Patient Measurements: Height: 6' (182.9 cm) Weight: 136.1 kg (300 lb) IBW/kg (Calculated) : 77.6 Heparin Dosing Weight: 108.7kg  Vital Signs: Temp: 98.1 F (36.7 C) (04/28 0820) Temp Source: Oral (04/28 0820) BP: 133/55 (04/28 1145) Pulse Rate: 48 (04/28 1145)  Labs: Recent Labs    12/16/20 0833 12/16/20 1054  HGB 14.5  --   HCT 44.8  --   PLT 299  --   CREATININE 0.87  --   TROPONINIHS 32* 893*    Estimated Creatinine Clearance: 137.1 mL/min (by C-G formula based on SCr of 0.87 mg/dL).   Medical History: Past Medical History:  Diagnosis Date  . Diabetes mellitus without complication (HCC)   . Hypertension   . Stroke [facility])     Medications:  Infusions:  . sodium chloride 1,000 mL (12/16/20 1234)  . heparin 1,300 Units/hr (12/16/20 1238)    Assessment: 55 yom presented to the ED with CP. Troponin elevated and now starting IV heparin for NSTEMI. Baseline CBC is WNL and he not on anticoagulation PTA.   Goal of Therapy:  Heparin level 0.3-0.7 units/ml Monitor platelets by anticoagulation protocol: Yes   Plan:  Heparin bolus 4000 units IV x 1 Heparin gtt 1300 units/hr Check a 6 hr heparin level Daily heparin level and CBC  Timothy Golden, Drake Leach 12/16/2020,12:12 PM

## 2020-12-16 NOTE — Progress Notes (Signed)
Received from [facility] to cath lab holding area. Alert and oriented, 1/10 dull chest pain. To cath lab

## 2020-12-16 NOTE — ED Notes (Signed)
XR at bedside

## 2020-12-17 ENCOUNTER — Observation Stay (HOSPITAL_BASED_OUTPATIENT_CLINIC_OR_DEPARTMENT_OTHER): Payer: 59

## 2020-12-17 ENCOUNTER — Encounter (HOSPITAL_COMMUNITY): Payer: Self-pay | Admitting: Internal Medicine

## 2020-12-17 DIAGNOSIS — I2510 Atherosclerotic heart disease of native coronary artery without angina pectoris: Secondary | ICD-10-CM | POA: Diagnosis present

## 2020-12-17 DIAGNOSIS — Z79899 Other long term (current) drug therapy: Secondary | ICD-10-CM | POA: Diagnosis not present

## 2020-12-17 DIAGNOSIS — Z7982 Long term (current) use of aspirin: Secondary | ICD-10-CM | POA: Diagnosis not present

## 2020-12-17 DIAGNOSIS — Z9641 Presence of insulin pump (external) (internal): Secondary | ICD-10-CM | POA: Diagnosis present

## 2020-12-17 DIAGNOSIS — I214 Non-ST elevation (NSTEMI) myocardial infarction: Secondary | ICD-10-CM

## 2020-12-17 DIAGNOSIS — I10 Essential (primary) hypertension: Secondary | ICD-10-CM | POA: Diagnosis present

## 2020-12-17 DIAGNOSIS — Z7984 Long term (current) use of oral hypoglycemic drugs: Secondary | ICD-10-CM | POA: Diagnosis not present

## 2020-12-17 DIAGNOSIS — E8881 Metabolic syndrome: Secondary | ICD-10-CM | POA: Diagnosis present

## 2020-12-17 DIAGNOSIS — Z6841 Body Mass Index (BMI) 40.0 and over, adult: Secondary | ICD-10-CM | POA: Diagnosis not present

## 2020-12-17 DIAGNOSIS — Z888 Allergy status to other drugs, medicaments and biological substances status: Secondary | ICD-10-CM | POA: Diagnosis not present

## 2020-12-17 DIAGNOSIS — M25512 Pain in left shoulder: Secondary | ICD-10-CM | POA: Diagnosis not present

## 2020-12-17 DIAGNOSIS — I77810 Thoracic aortic ectasia: Secondary | ICD-10-CM | POA: Diagnosis present

## 2020-12-17 DIAGNOSIS — Z951 Presence of aortocoronary bypass graft: Secondary | ICD-10-CM | POA: Diagnosis not present

## 2020-12-17 DIAGNOSIS — Z833 Family history of diabetes mellitus: Secondary | ICD-10-CM | POA: Diagnosis not present

## 2020-12-17 DIAGNOSIS — E785 Hyperlipidemia, unspecified: Secondary | ICD-10-CM | POA: Diagnosis present

## 2020-12-17 DIAGNOSIS — R001 Bradycardia, unspecified: Secondary | ICD-10-CM | POA: Diagnosis present

## 2020-12-17 DIAGNOSIS — D62 Acute posthemorrhagic anemia: Secondary | ICD-10-CM | POA: Diagnosis not present

## 2020-12-17 DIAGNOSIS — R778 Other specified abnormalities of plasma proteins: Secondary | ICD-10-CM | POA: Diagnosis not present

## 2020-12-17 DIAGNOSIS — Z8249 Family history of ischemic heart disease and other diseases of the circulatory system: Secondary | ICD-10-CM | POA: Diagnosis not present

## 2020-12-17 DIAGNOSIS — I25110 Atherosclerotic heart disease of native coronary artery with unstable angina pectoris: Secondary | ICD-10-CM

## 2020-12-17 DIAGNOSIS — Z91013 Allergy to seafood: Secondary | ICD-10-CM | POA: Diagnosis not present

## 2020-12-17 DIAGNOSIS — E1065 Type 1 diabetes mellitus with hyperglycemia: Secondary | ICD-10-CM | POA: Diagnosis present

## 2020-12-17 DIAGNOSIS — Z01810 Encounter for preprocedural cardiovascular examination: Secondary | ICD-10-CM | POA: Diagnosis not present

## 2020-12-17 DIAGNOSIS — E1069 Type 1 diabetes mellitus with other specified complication: Secondary | ICD-10-CM | POA: Diagnosis present

## 2020-12-17 DIAGNOSIS — Z20822 Contact with and (suspected) exposure to covid-19: Secondary | ICD-10-CM | POA: Diagnosis present

## 2020-12-17 DIAGNOSIS — I6523 Occlusion and stenosis of bilateral carotid arteries: Secondary | ICD-10-CM | POA: Diagnosis present

## 2020-12-17 LAB — GLUCOSE, CAPILLARY
Glucose-Capillary: 214 mg/dL — ABNORMAL HIGH (ref 70–99)
Glucose-Capillary: 248 mg/dL — ABNORMAL HIGH (ref 70–99)
Glucose-Capillary: 257 mg/dL — ABNORMAL HIGH (ref 70–99)
Glucose-Capillary: 311 mg/dL — ABNORMAL HIGH (ref 70–99)
Glucose-Capillary: 340 mg/dL — ABNORMAL HIGH (ref 70–99)

## 2020-12-17 LAB — BASIC METABOLIC PANEL
Anion gap: 9 (ref 5–15)
BUN: 17 mg/dL (ref 6–20)
CO2: 25 mmol/L (ref 22–32)
Calcium: 9.1 mg/dL (ref 8.9–10.3)
Chloride: 102 mmol/L (ref 98–111)
Creatinine, Ser: 0.89 mg/dL (ref 0.61–1.24)
GFR, Estimated: >60 mL/min (ref >60)
Glucose, Bld: 267 mg/dL — ABNORMAL HIGH (ref 70–99)
Potassium: 4 mmol/L (ref 3.5–5.1)
Sodium: 136 mmol/L (ref 135–145)

## 2020-12-17 LAB — ECHOCARDIOGRAM COMPLETE
Area-P 1/2: 2.08 cm2
Height: 72 in
S' Lateral: 3.2 cm
Weight: 4796.8 oz

## 2020-12-17 LAB — CBC
HCT: 44 % (ref 39.0–52.0)
Hemoglobin: 14.5 g/dL (ref 13.0–17.0)
MCH: 28 pg (ref 26.0–34.0)
MCHC: 33 g/dL (ref 30.0–36.0)
MCV: 85.1 fL (ref 80.0–100.0)
Platelets: 281 10*3/uL (ref 150–400)
RBC: 5.17 MIL/uL (ref 4.22–5.81)
RDW: 13.5 % (ref 11.5–15.5)
WBC: 9.5 10*3/uL (ref 4.0–10.5)
nRBC: 0 % (ref 0.0–0.2)

## 2020-12-17 LAB — HEMOGLOBIN A1C
Hgb A1c MFr Bld: 8.2 % — ABNORMAL HIGH (ref 4.8–5.6)
Mean Plasma Glucose: 188.64 mg/dL

## 2020-12-17 LAB — HEPARIN LEVEL (UNFRACTIONATED)
Heparin Unfractionated: <0.1 IU/mL — ABNORMAL LOW (ref 0.30–0.70)
Heparin Unfractionated: 0.14 IU/mL — ABNORMAL LOW (ref 0.30–0.70)

## 2020-12-17 LAB — LIPID PANEL
Cholesterol: 156 mg/dL (ref 0–200)
HDL: 34 mg/dL — ABNORMAL LOW (ref >40)
LDL Cholesterol: 103 mg/dL — ABNORMAL HIGH (ref 0–99)
Total CHOL/HDL Ratio: 4.6 RATIO
Triglycerides: 94 mg/dL (ref <150)
VLDL: 19 mg/dL (ref 0–40)

## 2020-12-17 MED ORDER — MAGNESIUM SULFATE 50 % IJ SOLN
40.0000 meq | INTRAMUSCULAR | Status: DC
  Filled 2020-12-17: qty 9.85

## 2020-12-17 MED ORDER — ATORVASTATIN CALCIUM 80 MG PO TABS
80.0000 mg | ORAL_TABLET | Freq: Every day | ORAL | Status: DC
  Administered 2020-12-17 – 2020-12-25 (×8): 80 mg via ORAL
  Filled 2020-12-17 (×8): qty 1

## 2020-12-17 MED ORDER — SODIUM CHLORIDE 0.9 % IV SOLN
INTRAVENOUS | Status: DC
  Filled 2020-12-17: qty 30

## 2020-12-17 MED ORDER — NITROGLYCERIN IN D5W 200-5 MCG/ML-% IV SOLN
2.0000 ug/min | INTRAVENOUS | Status: DC
  Filled 2020-12-17: qty 250

## 2020-12-17 MED ORDER — NOREPINEPHRINE 4 MG/250ML-% IV SOLN
0.0000 ug/min | INTRAVENOUS | Status: DC
  Filled 2020-12-17: qty 250

## 2020-12-17 MED ORDER — INSULIN ASPART 100 UNIT/ML IJ SOLN: 25.0000 [IU] | Freq: Once | INTRAMUSCULAR | Status: DC

## 2020-12-17 MED ORDER — PLASMA-LYTE 148 IV SOLN
INTRAVENOUS | Status: DC
  Filled 2020-12-17: qty 2.5

## 2020-12-17 MED ORDER — DEXMEDETOMIDINE HCL IN NACL 400 MCG/100ML IV SOLN
0.1000 ug/kg/h | INTRAVENOUS | Status: AC
  Administered 2020-12-20: .7 ug/kg/h via INTRAVENOUS
  Administered 2020-12-20: .5 ug/kg/h via INTRAVENOUS
  Filled 2020-12-17: qty 100

## 2020-12-17 MED ORDER — SODIUM CHLORIDE 0.9 % IV SOLN
750.0000 mg | INTRAVENOUS | Status: AC
  Administered 2020-12-20: 750 mg via INTRAVENOUS
  Filled 2020-12-17: qty 750

## 2020-12-17 MED ORDER — INSULIN GLARGINE 100 UNIT/ML ~~LOC~~ SOLN
50.0000 [IU] | Freq: Every day | SUBCUTANEOUS | Status: DC
  Administered 2020-12-17: 50 [IU] via SUBCUTANEOUS
  Filled 2020-12-17 (×2): qty 0.5

## 2020-12-17 MED ORDER — INSULIN ASPART 100 UNIT/ML IJ SOLN: 0.0000 [IU] | Freq: Every day | INTRAMUSCULAR | Status: DC

## 2020-12-17 MED ORDER — LOSARTAN POTASSIUM 50 MG PO TABS: 100.0000 mg | ORAL_TABLET | Freq: Every day | ORAL | Status: DC

## 2020-12-17 MED ORDER — INSULIN ASPART 100 UNIT/ML IJ SOLN
0.0000 [IU] | Freq: Three times a day (TID) | INTRAMUSCULAR | Status: AC
  Administered 2020-12-18: 11 [IU] via SUBCUTANEOUS
  Administered 2020-12-18 – 2020-12-19 (×2): 3 [IU] via SUBCUTANEOUS
  Administered 2020-12-19: 7 [IU] via SUBCUTANEOUS
  Administered 2020-12-19: 4 [IU] via SUBCUTANEOUS

## 2020-12-17 MED ORDER — INSULIN ASPART 100 UNIT/ML IJ SOLN
10.0000 [IU] | Freq: Three times a day (TID) | INTRAMUSCULAR | Status: DC
  Administered 2020-12-17 (×2): 10 [IU] via SUBCUTANEOUS

## 2020-12-17 MED ORDER — MILRINONE LACTATE IN DEXTROSE 20-5 MG/100ML-% IV SOLN
0.3000 ug/kg/min | INTRAVENOUS | Status: DC
  Filled 2020-12-17: qty 100

## 2020-12-17 MED ORDER — INSULIN ASPART 100 UNIT/ML IJ SOLN: 20.0000 [IU] | Freq: Three times a day (TID) | INTRAMUSCULAR | Status: DC

## 2020-12-17 MED ORDER — PHENYLEPHRINE HCL-NACL 20-0.9 MG/250ML-% IV SOLN
30.0000 ug/min | INTRAVENOUS | Status: AC
  Administered 2020-12-20: 30 ug/min via INTRAVENOUS
  Filled 2020-12-17: qty 250

## 2020-12-17 MED ORDER — LOSARTAN POTASSIUM 50 MG PO TABS
50.0000 mg | ORAL_TABLET | Freq: Every day | ORAL | Status: DC
  Administered 2020-12-17 – 2020-12-19 (×3): 50 mg via ORAL
  Filled 2020-12-17 (×3): qty 1

## 2020-12-17 MED ORDER — EPINEPHRINE HCL 5 MG/250ML IV SOLN IN NS
0.0000 ug/min | INTRAVENOUS | Status: DC
  Filled 2020-12-17: qty 250

## 2020-12-17 MED ORDER — INSULIN REGULAR(HUMAN) IN NACL 100-0.9 UT/100ML-% IV SOLN
INTRAVENOUS | Status: DC
  Filled 2020-12-17: qty 100

## 2020-12-17 MED ORDER — SODIUM CHLORIDE 0.9 % IV SOLN
1.5000 g | INTRAVENOUS | Status: AC
  Administered 2020-12-20: 1.5 g via INTRAVENOUS
  Filled 2020-12-17: qty 1.5

## 2020-12-17 MED ORDER — TRANEXAMIC ACID (OHS) BOLUS VIA INFUSION
15.0000 mg/kg | INTRAVENOUS | Status: AC
  Administered 2020-12-20: 2040 mg via INTRAVENOUS
  Filled 2020-12-17: qty 2040

## 2020-12-17 MED ORDER — TRANEXAMIC ACID (OHS) PUMP PRIME SOLUTION
2.0000 mg/kg | INTRAVENOUS | Status: DC
  Filled 2020-12-17: qty 2.72

## 2020-12-17 MED ORDER — TRANEXAMIC ACID 1000 MG/10ML IV SOLN
1.5000 mg/kg/h | INTRAVENOUS | Status: AC
  Administered 2020-12-20: 1.5 mg/kg/h via INTRAVENOUS
  Filled 2020-12-17: qty 25

## 2020-12-17 MED ORDER — VANCOMYCIN HCL 1500 MG/300ML IV SOLN
1500.0000 mg | INTRAVENOUS | Status: AC
  Administered 2020-12-20: 1500 mg via INTRAVENOUS
  Filled 2020-12-17: qty 300

## 2020-12-17 MED ORDER — INSULIN ASPART 100 UNIT/ML IJ SOLN
25.0000 [IU] | Freq: Once | INTRAMUSCULAR | Status: AC
  Administered 2020-12-17: 25 [IU] via SUBCUTANEOUS

## 2020-12-17 MED ORDER — INSULIN ASPART 100 UNIT/ML IJ SOLN
0.0000 [IU] | Freq: Three times a day (TID) | INTRAMUSCULAR | Status: DC
  Administered 2020-12-17: 15 [IU] via SUBCUTANEOUS
  Administered 2020-12-17: 7 [IU] via SUBCUTANEOUS

## 2020-12-17 MED ORDER — TRANEXAMIC ACID 1000 MG/10ML IV SOLN
1.5000 mg/kg/h | INTRAVENOUS | Status: DC
  Filled 2020-12-17: qty 25

## 2020-12-17 MED ORDER — INSULIN ASPART 100 UNIT/ML IJ SOLN
0.0000 [IU] | Freq: Every day | INTRAMUSCULAR | Status: DC
  Administered 2020-12-17: 7 [IU] via SUBCUTANEOUS
  Administered 2020-12-18: 15 [IU] via SUBCUTANEOUS

## 2020-12-17 MED ORDER — POTASSIUM CHLORIDE 2 MEQ/ML IV SOLN
80.0000 meq | INTRAVENOUS | Status: DC
  Filled 2020-12-17: qty 40

## 2020-12-17 NOTE — Progress Notes (Signed)
Patient verbalized concern regarding Blood sugars and amount of insulin being given during the shift. Spoke to Diabetes Coordinator and was told to give patient Lantus 50 units early tonight and then start Lantus 50 in am. Patient was given resistant s/s insulin and 10 units with meals today. Would not eat tonight as he was concerned about his sugar levels. Spoke to [employer] PA regarding situation. Patient given 25 units at around 1630 for CBG 340. At 1930 CBG was 272 patient given Basal insulin 50 units of lantus insulin. Given 25 units of novolog for CBG 272 and patient agreed to eat after given insulin. Contacted by Dr. Lyda Perone and reviewed above information. Dr. Julian Reil into see patient at 2040. Discussed with pharmacist as well and she was notified that Dr. Julian Reil is monitoring patients sugars and ordering insulin for tonight.

## 2020-12-17 NOTE — Progress Notes (Signed)
ANTICOAGULATION CONSULT NOTE - Follow-Up Consult  Pharmacy Consult for heparin Indication: chest pain/ACS  Patient Measurements: Height: 6' (182.9 cm) Weight: 136 kg (299 lb 12.8 oz) IBW/kg (Calculated) : 77.6 Heparin Dosing Weight: 108.7kg  Vital Signs: Temp: 97.8 F (36.6 C) (04/29 0737) Temp Source: Oral (04/29 0737) BP: 134/52 (04/29 0500) Pulse Rate: 52 (04/29 0500)  Labs: Recent Labs    12/16/20 0833 12/16/20 1054 12/17/20 0739  HGB 14.5  --  14.5  HCT 44.8  --  44.0  PLT 299  --  281  HEPARINUNFRC  --   --  <0.10*  CREATININE 0.87  --  0.89  TROPONINIHS 32* 893*  --     Estimated Creatinine Clearance: 134 mL/min (by C-G formula based on SCr of 0.89 mg/dL).   Medications:  Infusions:  . sodium chloride 10 mL/hr at 12/17/20 0525  . heparin 1,300 Units/hr (12/17/20 0525)    Assessment: 76 YOM who presented with CP/NSTEMI and is now s/p cath which showed complex distal main disease - consulting CVTS.   Heparin level came back subtherapeutic (<0.1), on heparin 1300 units/hr. Hgb 14.5, plt 218. No s/sx of bleeding or infusion issues.    Goal of Therapy:  Heparin level 0.3-0.7 units/ml Monitor platelets by anticoagulation protocol: Yes   Plan:  -Increase heparin infusion to 1600 units/hr -Order heparin 6 hr HL  -Monitor daily HL, CBC, and for s/sx of bleeding   Thank you for allowing pharmacy to be a part of this patient's care.  Sherron Monday, PharmD, BCCCP Clinical Pharmacist  Phone: [phone] 12/17/2020 9:12 AM  Please check AMION for all [facility] Pharmacy phone numbers After 10:00 PM, call Main Pharmacy [phone]

## 2020-12-17 NOTE — Progress Notes (Signed)
Discussed IS (2500 mL), sternal precautions, mobility post op and d/c planning. Pt receptive. He has family who will be with him at d/c. Did not ambulate due to extent of disease but encouraged being up in room. Has Transport planner. [phone] Ethelda Chick CES, ACSM 2:55 PM] 12/17/2020

## 2020-12-17 NOTE — Progress Notes (Signed)
  Echocardiogram 2D Echocardiogram has been performed.  Timothy Golden 12/17/2020, 9:25 AM

## 2020-12-17 NOTE — H&P (View-Only) (Signed)
    [address]       [place] [address]             [phone]        Timothy Golden [facility] Medical Record #[id number] Date of Birth: 02/28/1966  Referring: No ref. provider found Primary Care: [facility] Primary Cardiologist:No primary care provider on file.  Chief Complaint:    Chief Complaint  Patient presents with  . Chest Pain    History of Present Illness:    The patient is a 55-year-old male we are asked to see in cardiac surgery consultation for consideration of CABG.  He has known significant comorbidities and cardiac risk factors including diabetes mellitus type 1, hypertension, hyperlipidemia, CVA  and morbid obesity with a BMI greater than 40.  He presented to the emergency department on 12/16/2020 with chest pain.  Approximately 2 hours prior to arrival in the emergency department he had an episode of heaviness in his chest after getting up.  It was not felt to be exertional in nature and there was no radiation or associated symptoms.  He denied shortness of breath.  He denied nausea or vomiting.  He has no history of tobacco use.  He has no significant family history of coronary artery disease.  Initial EKG showed sinus bradycardia with a heart rate of 54 with no acute ST, T wave abnormalities but there was a noted intraventricular conduction delay.  Initial high sensitive troponin was mildly elevated at 32 but significantly increased on second measurement to 893.  He was felt to require urgent cardiac catheterization and was taken to the Cath Lab where he was found to have significant coronary artery disease.  Please see the full report listed below.  He has significant left main, LAD, ramus intermedius and circumflex disease.  LV function by visual estimate was 35 to 45%.  Echocardiogram has been done and results are currently pending.  His wife is here with him today.  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  He has a 9-year-old  son.  Past Medical History:  Diagnosis Date  . Diabetes mellitus without complication (HCC)   . HLD (hyperlipidemia)   . Hypertension   . Obesity   . Stroke (HCC)     Past Surgical History:  Procedure Laterality Date  . EYE SURGERY    . LEFT HEART CATH AND CORONARY ANGIOGRAPHY N/A 12/16/2020   Procedure: LEFT HEART CATH AND CORONARY ANGIOGRAPHY;  Surgeon: Varanasi, Jayadeep S, MD;  Location: [facility] INVASIVE CV LAB;  Service: Cardiovascular;  Laterality: N/A;  . SHOULDER SURGERY      Social History   Tobacco Use  Smoking Status Never Smoker  Smokeless Tobacco Never Used    Social History   Substance and Sexual Activity  Alcohol Use Never     Allergies  Allergen Reactions  . Ace Inhibitors     Other reaction(s): Cough (ALLERGY/intolerance)  . Shellfish Allergy Rash    hives    Current Facility-Administered Medications  Medication Dose Route Frequency Provider Last Rate Last Admin  . 0.9 %  sodium chloride infusion   Intravenous PRN Varanasi, Jayadeep S, MD 10 mL/hr at 12/17/20 0525 Infusion Verify at 12/17/20 0525  . acetaminophen (TYLENOL) tablet 650 mg  650 mg Oral Q4H PRN Varanasi, Jayadeep S, MD      . aspirin EC tablet 81 mg  81 mg Oral Daily Duke, Angela Nicole, PA      .   atorvastatin (LIPITOR) tablet 80 mg  80 mg Oral Daily Georgie Chard D, NP      . heparin ADULT infusion 100 units/mL (25000 units/238mL)  1,300 Units/hr Intravenous Continuous Ann Held, [facility] 13 mL/hr at 12/17/20 0525 1,300 Units/hr at 12/17/20 0525  . insulin aspart (novoLOG) injection 0-20 Units  0-20 Units Subcutaneous TID WC Georgie Chard D, NP   7 Units at 12/17/20 [phone]  . insulin aspart (novoLOG) injection 0-5 Units  0-5 Units Subcutaneous QHS Georgie Chard D, NP      . insulin aspart (novoLOG) injection 10 Units  10 Units Subcutaneous TID WC Georgie Chard D, NP   10 Units at 12/17/20 [phone]  . insulin glargine (LANTUS) injection 50 Units  50 Units Subcutaneous QHS Georgie Chard D,  NP      . losartan (COZAAR) tablet 50 mg  50 mg Oral Daily Georgie Chard D, NP      . nitroGLYCERIN (NITROSTAT) SL tablet 0.4 mg  0.4 mg Sublingual Q5 Min x 3 PRN Lance Muss S, MD      . ondansetron [facility]) injection 4 mg  4 mg Intravenous Q6H PRN Corky Crafts, MD        Medications Prior to Admission  Medication Sig Dispense Refill Last Dose  . aspirin 81 MG EC tablet Take 81 mg by mouth daily.   12/16/2020 at Unknown time  . atenolol (TENORMIN) 100 MG tablet Take 100 mg by mouth daily.   12/16/2020 at 0630  . atorvastatin (LIPITOR) 40 MG tablet Take 1 tablet by mouth daily.   12/16/2020 at Unknown time  . Continuous Blood Gluc Sensor (DEXCOM G6 SENSOR) MISC Apply 1 sensor to the skin every 10 days for continuous glucose monitoring.     . diltiazem (CARDIZEM CD) 240 MG 24 hr capsule Take 480 mg by mouth daily.   12/16/2020 at Unknown time  . hydrochlorothiazide (HYDRODIURIL) 25 MG tablet Take 1 tablet by mouth daily.   12/16/2020 at Unknown time  . losartan (COZAAR) 100 MG tablet Take 1 tablet by mouth daily.   12/16/2020 at Unknown time  . metFORMIN (GLUCOPHAGE-XR) 500 MG 24 hr tablet Take 1,000 mg by mouth 2 (two) times daily.   12/16/2020 at Unknown time  . NOVOLOG 100 UNIT/ML injection Inject 10-30 Units into the skin 3 (three) times daily with meals. Sliding Scale   12/15/2020 at Unknown time  . TRESIBA FLEXTOUCH 200 UNIT/ML FlexTouch Pen Inject 50 Units into the skin in the morning.   12/16/2020 at Unknown time  . HUMALOG 100 UNIT/ML injection Inject into the skin 3 (three) times daily with meals. Sliding Scale     . TOUJEO MAX SOLOSTAR 300 UNIT/ML Solostar Pen Inject into the skin.       Family History  Problem Relation Age of Onset  . Hypertension Father      Review of Systems:   Review of Systems  Constitutional: Positive for malaise/fatigue. Negative for chills and fever.  HENT: Negative.   Eyes: Negative.   Respiratory: Negative for shortness of breath.    Cardiovascular: Positive for chest pain. Negative for orthopnea and leg swelling.  Gastrointestinal: Negative.   Genitourinary: Negative.   Musculoskeletal: Negative.   Skin: Negative.   Neurological: Negative for dizziness, focal weakness and seizures.  Endo/Heme/Allergies: Negative.   Psychiatric/Behavioral: Negative.       Physical Exam: BP (!) 134/52 (BP Location: Left Arm)   Pulse (!) 52   Temp 97.8 F (36.6 C) (Oral)  Resp 18   Ht 6' (1.829 m)   Wt 136 kg   SpO2 96%   BMI 40.66 kg/m    Physical Exam  Constitutional: No distress.  HENT:  Mouth/Throat: Oropharynx is clear.  Eyes: Pupils are equal, round, and reactive to light. Conjunctivae are normal.  Neck: No JVD present. No neck adenopathy.  Cardiovascular: Normal rate, regular rhythm, normal heart sounds, intact distal pulses and normal pulses.  No murmur heard. Pulmonary/Chest: Effort normal and breath sounds normal.  Abdominal: Soft. He exhibits no distension. There is no abdominal tenderness.  obese  Musculoskeletal:        General: No edema. Normal range of motion.     Cervical back: Normal range of motion and neck supple.  Neurological: He is alert and oriented to person, place, and time. He has normal motor skills and intact cranial nerves.  Skin: Skin is warm and dry.   Diagnostic Studies & Laboratory data:     Recent Radiology Findings:    LEFT HEART CATH AND CORONARY ANGIOGRAPHY    Conclusion    Ost LAD to Prox LAD lesion is 100% stenosed. Right to left collaterals are present.  Dist LM lesion is 60% stenosed.  Ramus lesion is 75% stenosed.  Ost Cx lesion is 60% stenosed.  The left ventricular ejection fraction is 35-45% by visual estimate.  There is mild to moderate left ventricular systolic dysfunction.  LV end diastolic pressure is mildly elevated.  There is no aortic valve stenosis.   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  Patient is hemodynamically  stable without chest pain.  Restart IV heparin in 8 hours.    Can add IV NTG if he has any discomfort or hypertension.     I discussed the findings with the patient and his wife.    Recommendations  Discharge Date Anticipated discharge date to be determined.   Indications  Non-ST elevation (NSTEMI) myocardial infarction (HCC) [I21.4 (ICD-10-CM)]   Procedural Details  Technical Details The risks, benefits, and details of the procedure were explained to the patient.  The patient verbalized understanding and wanted to proceed.  Informed written consent was obtained.  PROCEDURE TECHNIQUE:  After Xylocaine anesthesia a 42F slender sheath was placed in the right radial artery with a single anterior needle wall stick using ultrasound guidance.   IV Heparin was given.  Left ventriculography was done using a JR4 catheter.  Right coronary angiography was done using a Judkins R4 guide catheter.  Left coronary angiography was done using a Judkins L3.5 guide catheter.    A TR band was used for hemostasis.  Contrast: 55 cc  Estimated blood loss <50 mL.   During this procedure medications were administered to achieve and maintain moderate conscious sedation while the patient's heart rate, blood pressure, and oxygen saturation were continuously monitored and I was present face-to-face 100% of this time.   Medications (Filter: Administrations occurring from 1622 to 1710 on 12/16/20) (important) Continuous medications are totaled by the amount administered until 12/16/20 1710.    midazolam (VERSED) injection (mg) Total dose:  2 mg  Date/Time Rate/Dose/Volume Action   12/16/20 1640 2 mg Given    fentaNYL (SUBLIMAZE) injection (mcg) Total dose:  50 mcg  Date/Time Rate/Dose/Volume Action   12/16/20 1640 50 mcg Given    lidocaine (PF) (XYLOCAINE) 1 % injection (mL) Total volume:  3 mL  Date/Time Rate/Dose/Volume Action   12/16/20 1646 3 mL Given    Heparin (Porcine) in NaCl  1000-0.9 UT/500ML-% SOLN (mL) Total volume:  1,000 mL  Date/Time Rate/Dose/Volume Action   12/16/20 1646 500 mL Given   1646 500 mL Given    Radial Cocktail/Verapamil only (mL) Total volume:  10 mL  Date/Time Rate/Dose/Volume Action   12/16/20 1648 10 mL Given    heparin sodium (porcine) injection (Units) Total dose:  7,000 Units  Date/Time Rate/Dose/Volume Action   12/16/20 1649 7,000 Units Given    iohexol (OMNIPAQUE) 350 MG/ML injection (mL) Total volume:  55 mL  Date/Time Rate/Dose/Volume Action   12/16/20 1704 55 mL Given    insulin pump Total dose:  Cannot be calculated* Dosing weight:  136.1  *Administration dose not documented Date/Time Rate/Dose/Volume Action   12/16/20 1624 *Not included in total MAR Unhold    Sedation Time  Sedation Time Physician-1: 18 minutes 8 seconds   Contrast  Medication Name Total Dose  iohexol (OMNIPAQUE) 350 MG/ML injection 55 mL    Radiation/Fluoro  Fluoro time: 2.4 (min) DAP: 31.6 (Gycm2) Cumulative Air Kerma: 741.6 (mGy)   Complications   Complications documented before study signed (12/16/2020 5:39 PM)    No complications were associated with this study.  Documented by Corky Crafts, MD - 12/16/2020 5:34 PM     Coronary Findings   Diagnostic Dominance: Co-dominant  Left Main  Dist LM lesion is 60% stenosed.  Left Anterior Descending  Collaterals  Dist LAD filled by collaterals from RPDA.    Ost LAD to Prox LAD lesion is 100% stenosed.  Ramus Intermedius  Ramus lesion is 75% stenosed.  Left Circumflex  Ost Cx lesion is 60% stenosed.  Right Coronary Artery  The vessel exhibits minimal luminal irregularities.   Intervention   No interventions have been documented.  Wall Motion  Resting                Left Heart  Left Ventricle The left ventricular size is in the upper limits of normal. There is mild to moderate left ventricular systolic dysfunction. LV end diastolic pressure is  mildly elevated. The left ventricular ejection fraction is 35-45% by visual estimate.  Aortic Valve There is no aortic valve stenosis.   Coronary Diagrams   Diagnostic Dominance: Co-dominant          I have independently reviewed the above radiologic studies and discussed with the patient   Recent Lab Findings: Lab Results  Component Value Date   WBC 9.5 12/17/2020   HGB 14.5 12/17/2020   HCT 44.0 12/17/2020   PLT 281 12/17/2020   GLUCOSE 267 (H) 12/17/2020   CHOL 156 12/17/2020   TRIG 94 12/17/2020   HDL 34 (L) 12/17/2020   LDLCALC 103 (H) 12/17/2020   NA 136 12/17/2020   K 4.0 12/17/2020   CL 102 12/17/2020   CREATININE 0.89 12/17/2020   BUN 17 12/17/2020   CO2 25 12/17/2020   HGBA1C 8.2 (H) 12/17/2020   CARDIAC CATHETERIZATION  Result Date: 12/16/2020  Ost LAD to Prox LAD lesion is 100% stenosed. Right to left collaterals are present.  Dist LM lesion is 60% stenosed.  Ramus lesion is 75% stenosed.  Ost Cx lesion is 60% stenosed.  The left ventricular ejection fraction is 35-45% by visual estimate.  There is mild to moderate left ventricular systolic dysfunction.  LV end diastolic pressure is mildly elevated.  There is no aortic valve stenosis.  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  Patient is hemodynamically stable without chest pain. Restart IV heparin in 8 hours.  Can add IV NTG if he has any discomfort or hypertension.   I discussed the findings with the patient and his wife.   DG Chest Port 1 View  Result Date: 12/16/2020 CLINICAL DATA:  Chest pain and pressure beginning today. EXAM: PORTABLE CHEST 1 VIEW COMPARISON:  None. FINDINGS: Artifact overlies the chest. Heart size is normal. Mediastinal shadows are normal. The lungs are clear. No edema or effusion. No focal bone finding. IMPRESSION: No active disease. Electronically Signed   By: Paulina Fusi M.D.   On: 12/16/2020 08:49   Echocardiogram - results pending  Assessment /  Plan:      This 55 year old gentleman with type 1 diabetes and dyslipidemia presents with a non-ST segment elevation MI.  Cardiac catheterization shows the LAD to be occluded at the ostium with faint right to left collaterals.  The distal left main has about 60% stenosis and divides into a large ramus branch that has 75% ostial stenosis and a left circumflex that has 60% ostial stenosis before a large marginal branch.  Left ventricular ejection fraction is visually 35 to 45% with mildly elevated LVEDP of 18.  I agree that coronary artery bypass graft surgery is the best treatment for this patient. I discussed the operative procedure with the patient and his wife including alternatives, benefits and risks; including but not limited to bleeding, blood transfusion, infection, stroke, myocardial infarction, graft failure, heart block requiring a permanent pacemaker, organ dysfunction, and death.  Timothy Golden understands and agrees to proceed.  We will schedule surgery for Monday am.   I spent 60 minutes performing this consultation and > 50% of this time was spent face to face counseling and coordinating the care of this patient's severe multivessel coronary disease.   Alleen Borne, MD 12/17/2020

## 2020-12-17 NOTE — Consult Note (Signed)
Medical Consultation   Timothy Golden  ZOX:[id number]  DOB: 1966/06/12  DOA: 12/16/2020  PCP: Raynelle Bring [facility]   Requesting physician: Marjie Skiff PA-C  Reason for consultation: DM management   History of Present Illness: Timothy Golden is an 55 y.o. male with H/O DM1 since age 23.  He is admitted to hospital currently for NSTEMI and planned to undergo CABG on Monday.  His hospital stay has been mildly complicated by difficult to control BGLs despite diabetes coordinator consult.  This evening: BGL was 340 -> 25u novolog -> improved to 272 x3h later -> given 50u lantus + 25u novolog and ate dinner -> 295 as of 830.  Hospitalist has been consulted.  After starting metformin his insulin requirements as outpt were recently halved.  As of Feb pt normally takes [place] 50u QHS (actually pt says 65).  Novolog 20-30u TID with correction of 25u for 100 mg/dl > 811.  QHS correction of ~ 20u per /dl > 914   Review of Systems:  ROS As per HPI otherwise 10 point review of systems negative.     Past Medical History: Past Medical History:  Diagnosis Date  . Diabetes mellitus without complication (HCC)   . HLD (hyperlipidemia)   . Hypertension   . Obesity   . Stroke [facility])     Past Surgical History: Past Surgical History:  Procedure Laterality Date  . EYE SURGERY    . LEFT HEART CATH AND CORONARY ANGIOGRAPHY N/A 12/16/2020   Procedure: LEFT HEART CATH AND CORONARY ANGIOGRAPHY;  Surgeon: Corky Crafts, MD;  Location: [facility] INVASIVE CV LAB;  Service: Cardiovascular;  Laterality: N/A;  . SHOULDER SURGERY       Allergies:   Allergies  Allergen Reactions  . Ace Inhibitors     Other reaction(s): Cough (ALLERGY/intolerance)  . Shellfish Allergy Rash    hives     Social History:  reports that he has never smoked. He has never used smokeless tobacco. He reports that he does not drink alcohol and does not use drugs.   Family  History: Family History  Problem Relation Age of Onset  . Hypertension Father   . Diabetes Mellitus I Father     Physical Exam: Vitals:   12/17/20 0500 12/17/20 0737 12/17/20 1206 12/17/20 1628  BP: (!) 134/52  (!) 140/53 (!) 143/54  Pulse: (!) 52  (!) 57 62  Resp: Temp: 98.2 F (36.8 C) 97.8 F (36.6 C) 98.1 F (36.7 C) 97.9 F (36.6 C)  TempSrc: Oral Oral Oral Oral  SpO2: 96%  94% 93%  Weight: 136 kg     Height:        Constitutional: Alert and awake, oriented x3, not in any acute distress. Eyes: PERLA, EOMI, irises appear normal, anicteric sclera,  ENMT: external ears and nose appear normal            Lips appears normal, oropharynx mucosa, tongue, posterior pharynx appear normal  Neck: neck appears normal, no masses, normal ROM, no thyromegaly, no JVD  CVS: S1-S2 clear, no murmur rubs or gallops, no LE edema, normal pedal pulses  Respiratory:  clear to auscultation bilaterally, no wheezing, rales or rhonchi. Respiratory effort normal. No accessory muscle use.  Abdomen: soft nontender, nondistended, normal bowel sounds, no hepatosplenomegaly, no hernias  Musculoskeletal: : no cyanosis, clubbing or edema noted bilaterally  Neuro: Cranial nerves II-XII intact, strength,  sensation, reflexes Psych: judgement and insight appear normal, stable mood and affect, mental status Skin: no rashes or lesions or ulcers, no induration or nodules    Data reviewed:  I have personally reviewed following labs and imaging studies Labs:  CBC: Recent Labs  Lab 12/16/20 0833 12/17/20 0739  WBC 11.3* 9.5  HGB 14.5 14.5  HCT 44.8 44.0  MCV 85.8 85.1  PLT 299 281    Basic Metabolic Panel: Recent Labs  Lab 12/16/20 0833 12/17/20 0739  NA 138 136  K 3.9 4.0  CL 104 102  CO2 21* 25  GLUCOSE 249* 267*  BUN 28* 17  CREATININE 0.87 0.89  CALCIUM 9.9 9.1   GFR Estimated Creatinine Clearance: 134 mL/min (by C-G formula based on SCr of 0.89 mg/dL). Liver Function  Tests: No results for input(s): AST, ALT, ALKPHOS, BILITOT, PROT, ALBUMIN in the last 168 hours. No results for input(s): LIPASE, AMYLASE in the last 168 hours. No results for input(s): AMMONIA in the last 168 hours. Coagulation profile No results for input(s): INR, PROTIME in the last 168 hours.  Cardiac Enzymes: No results for input(s): CKTOTAL, CKMB, CKMBINDEX, TROPONINI in the last 168 hours. BNP: Invalid input(s): POCBNP CBG: Recent Labs  Lab 12/16/20 2134 12/17/20 0024 12/17/20 0759 12/17/20 1204 12/17/20 1625  GLUCAP 325* 257* 248* 311* 340*   D-Dimer No results for input(s): DDIMER in the last 72 hours. Hgb A1c Recent Labs    12/16/20 1807 12/17/20 0739  HGBA1C 8.3* 8.2*   Lipid Profile Recent Labs    12/17/20 0739  CHOL 156  HDL 34*  LDLCALC 103*  TRIG 94  CHOLHDL 4.6   Thyroid function studies No results for input(s): TSH, T4TOTAL, T3FREE, THYROIDAB in the last 72 hours.  Invalid input(s): FREET3 Anemia work up No results for input(s): VITAMINB12, FOLATE, FERRITIN, TIBC, IRON, RETICCTPCT in the last 72 hours. Urinalysis No results found for: COLORURINE, APPEARANCEUR, LABSPEC, PHURINE, GLUCOSEU, HGBUR, BILIRUBINUR, KETONESUR, PROTEINUR, UROBILINOGEN, NITRITE, LEUKOCYTESUR   Sepsis Labs Invalid input(s): PROCALCITONIN,  WBC,  LACTICIDVEN Microbiology Recent Results (from the past 240 hour(s))  Resp Panel by RT-PCR (Flu A&B, Covid) Nasopharyngeal Swab     Status: None   Collection Time: 12/16/20 12:44 PM   Specimen: Nasopharyngeal Swab; Nasopharyngeal(NP) swabs in vial transport medium  Result Value Ref Range Status   SARS Coronavirus 2 by RT PCR NEGATIVE NEGATIVE Final    Comment: (NOTE) SARS-CoV-2 target nucleic acids are NOT DETECTED.  The SARS-CoV-2 RNA is generally detectable in upper respiratory specimens during the acute phase of infection. The lowest concentration of SARS-CoV-2 viral copies this assay can detect is 138 copies/mL. A  negative result does not preclude SARS-Cov-2 infection and should not be used as the sole basis for treatment or other patient management decisions. A negative result may occur with  improper specimen collection/handling, submission of specimen other than nasopharyngeal swab, presence of viral mutation(s) within the areas targeted by this assay, and inadequate number of viral copies(<138 copies/mL). A negative result must be combined with clinical observations, patient history, and epidemiological information. The expected result is Negative.  Fact Sheet for Patients:  BloggerCourse.com  Fact Sheet for Healthcare Providers:  SeriousBroker.it  This test is no t yet approved or cleared by the [place] FDA and  has been authorized for detection and/or diagnosis of SARS-CoV-2 by FDA under an Emergency Use Authorization (EUA). This EUA will remain  in effect (meaning this test can be used) for the duration of the COVID-19  declaration under Section 564(b)(1) of the Act, 21 U.S.C.section 360bbb-3(b)(1), unless the authorization is terminated  or revoked sooner.       Influenza A by PCR NEGATIVE NEGATIVE Final   Influenza B by PCR NEGATIVE NEGATIVE Final    Comment: (NOTE) The Xpert Xpress SARS-CoV-2/FLU/RSV plus assay is intended as an aid in the diagnosis of influenza from Nasopharyngeal swab specimens and should not be used as a sole basis for treatment. Nasal washings and aspirates are unacceptable for Xpert Xpress SARS-CoV-2/FLU/RSV testing.  Fact Sheet for Patients: BloggerCourse.com  Fact Sheet for Healthcare Providers: SeriousBroker.it  This test is not yet approved or cleared by the [place] FDA and has been authorized for detection and/or diagnosis of SARS-CoV-2 by FDA under an Emergency Use Authorization (EUA). This EUA will remain in effect (meaning this test can  be used) for the duration of the COVID-19 declaration under Section 564(b)(1) of the Act, 21 U.S.C. section 360bbb-3(b)(1), unless the authorization is terminated or revoked.  Performed at [facility], [address], [place], [place] [address]        Inpatient Medications:   Scheduled Meds: . aspirin EC  81 mg Oral Daily  . atorvastatin  80 mg Oral Daily  . [START ON 12/20/2020] epinephrine  0-10 mcg/min Intravenous To OR  . [START ON 12/20/2020] heparin-papaverine-plasmalyte irrigation   Irrigation To OR  . insulin aspart  0-20 Units Subcutaneous TID WC  . insulin aspart  0-5 Units Subcutaneous QHS  . insulin aspart  10 Units Subcutaneous TID WC  . insulin glargine  50 Units Subcutaneous QHS  . [START ON 12/20/2020] insulin   Intravenous To OR  . losartan  50 mg Oral Daily  . [START ON 12/20/2020] magnesium sulfate  40 mEq Other To OR  . [START ON 12/20/2020] phenylephrine  30-200 mcg/min Intravenous To OR  . [START ON 12/20/2020] potassium chloride  80 mEq Other To OR  . [START ON 12/20/2020] tranexamic acid  15 mg/kg Intravenous To OR  . [START ON 12/20/2020] tranexamic acid  2 mg/kg Intracatheter To OR   Continuous Infusions: . sodium chloride 10 mL/hr at 12/17/20 0525  . [START ON 12/20/2020] cefUROXime (ZINACEF)  IV    . [START ON 12/20/2020] cefUROXime (ZINACEF)  IV    . [START ON 12/20/2020] dexmedetomidine    . [START ON 12/20/2020] heparin 30,000 units/NS 1000 mL solution for CELLSAVER    . heparin 1,950 Units/hr (12/17/20 2006)  . [START ON 12/20/2020] milrinone    . [START ON 12/20/2020] nitroGLYCERIN    . [START ON 12/20/2020] norepinephrine    . [START ON 12/20/2020] tranexamic acid (CYKLOKAPRON) infusion ([facility])    . [START ON 12/20/2020] tranexamic acid (CYKLOKAPRON) infusion ([facility])    . [START ON 12/20/2020] vancomycin       Radiological Exams on Admission: CARDIAC CATHETERIZATION  Result Date: 12/16/2020  Ost LAD to Prox LAD lesion is 100% stenosed. Right to left  collaterals are present.  Dist LM lesion is 60% stenosed.  Ramus lesion is 75% stenosed.  Ost Cx lesion is 60% stenosed.  The left ventricular ejection fraction is 35-45% by visual estimate.  There is mild to moderate left ventricular systolic dysfunction.  LV end diastolic pressure is mildly elevated.  There is no aortic valve stenosis.  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  Patient is hemodynamically stable without chest pain. Restart IV heparin in 8 hours.  Can add IV NTG if he has any discomfort or hypertension.  I discussed the findings with the patient and his wife.   DG Chest Port 1 View  Result Date: 12/16/2020 CLINICAL DATA:  Chest pain and pressure beginning today. EXAM: PORTABLE CHEST 1 VIEW COMPARISON:  None. FINDINGS: Artifact overlies the chest. Heart size is normal. Mediastinal shadows are normal. The lungs are clear. No edema or effusion. No focal bone finding. IMPRESSION: No active disease. Electronically Signed   By: Paulina FusiMark  Shogry M.D.   On: 12/16/2020 08:49   ECHOCARDIOGRAM COMPLETE  Result Date: 12/17/2020    ECHOCARDIOGRAM REPORT   Patient Name:   Rudi RummageREVOR Golden Date of Exam: 12/17/2020 Medical Rec #:  [id number]    Height:       72.0 in Accession #:    [id number]   Weight:       299.8 lb Date of Birth:  [birth year]    BSA:          2.531 m Patient Age:    55 years     BP:           134/52 mmHg Patient Gender: M            HR:           49 bpm. Exam Location:  Inpatient Procedure: 2D Echo, Color Doppler and Cardiac Doppler Indications:    NSTEMI I21.4  History:        Patient has no prior history of Echocardiogram examinations.                 Stroke; Risk Factors:Hypertension, Diabetes and Dyslipidemia.  Sonographer:    Eulah PontSarah Pirrotta RDCS Referring Phys: [phone] JILL D MCDANIEL IMPRESSIONS  1. Left ventricular ejection fraction, by estimation, is 60 to 65%. The left ventricle has normal function. The left ventricle has no regional wall motion abnormalities.  Left ventricular diastolic parameters are consistent with Grade II diastolic dysfunction (pseudonormalization). Elevated left ventricular end-diastolic pressure.  2. Right ventricular systolic function is normal. The right ventricular size is normal. There is normal pulmonary artery systolic pressure.  3. Left atrial size was moderately dilated.  4. The mitral valve is normal in structure. Trivial mitral valve regurgitation. No evidence of mitral stenosis.  5. The aortic valve is tricuspid. Aortic valve regurgitation is not visualized. No aortic stenosis is present.  6. Aortic dilatation noted. There is mild dilatation of the ascending aorta, measuring 37 mm.  7. The inferior vena cava is normal in size with greater than 50% respiratory variability, suggesting right atrial pressure of 3 mmHg. FINDINGS  Left Ventricle: Left ventricular ejection fraction, by estimation, is 60 to 65%. The left ventricle has normal function. The left ventricle has no regional wall motion abnormalities. The left ventricular internal cavity size was normal in size. There is  no left ventricular hypertrophy. Left ventricular diastolic parameters are consistent with Grade II diastolic dysfunction (pseudonormalization). Elevated left ventricular end-diastolic pressure. Right Ventricle: The right ventricular size is normal. No increase in right ventricular wall thickness. Right ventricular systolic function is normal. There is normal pulmonary artery systolic pressure. The tricuspid regurgitant velocity is 1.42 m/s, and  with an assumed right atrial pressure of 3 mmHg, the estimated right ventricular systolic pressure is 11.1 mmHg. Left Atrium: Left atrial size was moderately dilated. Right Atrium: Right atrial size was normal in size. Pericardium: There is no evidence of pericardial effusion. Mitral Valve: The mitral valve is normal in structure. Trivial mitral valve regurgitation. No evidence of mitral valve stenosis. Tricuspid Valve: The  tricuspid valve  is normal in structure. Tricuspid valve regurgitation is trivial. No evidence of tricuspid stenosis. Aortic Valve: The aortic valve is tricuspid. Aortic valve regurgitation is not visualized. No aortic stenosis is present. Pulmonic Valve: The pulmonic valve was normal in structure. Pulmonic valve regurgitation is not visualized. No evidence of pulmonic stenosis. Aorta: Aortic dilatation noted. There is mild dilatation of the ascending aorta, measuring 37 mm. Venous: The inferior vena cava is normal in size with greater than 50% respiratory variability, suggesting right atrial pressure of 3 mmHg. IAS/Shunts: No atrial level shunt detected by color flow Doppler.  LEFT VENTRICLE PLAX 2D LVIDd:         5.20 cm  Diastology LVIDs:         3.20 cm  LV e' medial:    4.87 cm/s LV PW:         0.90 cm  LV E/e' medial:  16.9 LV IVS:        0.80 cm  LV e' lateral:   11.90 cm/s LVOT diam:     2.10 cm  LV E/e' lateral: 6.9 LV SV:         81 LV SV Index:   32 LVOT Area:     3.46 cm  RIGHT VENTRICLE RV S prime:     10.60 cm/s TAPSE (M-mode): 2.0 cm LEFT ATRIUM             Index       RIGHT ATRIUM           Index LA diam:        3.60 cm 1.42 cm/m  RA Area:     12.20 cm LA Vol (A2C):   45.2 ml 17.86 ml/m RA Volume:   19.90 ml  7.86 ml/m LA Vol (A4C):   88.8 ml 35.09 ml/m LA Biplane Vol: 70.7 ml 27.94 ml/m  AORTIC VALVE LVOT Vmax:   90.60 cm/s LVOT Vmean:  59.800 cm/s LVOT VTI:    0.235 m  AORTA Ao Root diam: 3.40 cm Ao Asc diam:  3.70 cm MITRAL VALVE               TRICUSPID VALVE MV Area (PHT): 2.08 cm    TR Peak grad:   8.1 mmHg MV Decel Time: 364 msec    TR Vmax:        142.00 cm/s MV E velocity: 82.50 cm/s MV A velocity: 34.90 cm/s  SHUNTS MV E/A ratio:  2.36        Systemic VTI:  0.24 m                            Systemic Diam: 2.10 cm Chilton Si MD Electronically signed by Chilton Si MD Signature Date/Time: 12/17/2020/11:46:42 AM    Final     Impression/Recommendations Principal Problem:    NSTEMI (non-ST elevated myocardial infarction) [facility]) Active Problems:   Type 1 diabetes mellitus with other specified complication (HCC)  1. IDDM - (MODY, Type 1 DM with severe insulin resistance) 1. Cont lantus 50u QHS for now (just got dose about an hour ago or so) 2. Increasing mealtime coverage to 20u TID AC 3. Will have them give me a call after checking BGL between 9:30 and 10pm to give evening dose of novolog correction 4. BGL currently 295. 5. Probably needs a custom SSI entered into computer tomorrow for mealtime coverage -> 25u per 100 BGL > 140. 1. Want to make sure that tonights plan  works well first though before we put such high doses of insulin into computer system. 2. And we can avoid lows. 6. In the meantime will put in a custom SSI for HS coverage based on what patient tells me he would take QHS: 1. 0u for BGL of 200 2. 15u for BGL of 250 3. 20u for BGL of 300 4. Etc. 7. Recheck CBG at 0300 8. In general - trust pt to try and manage own diabetes which he has been managing (fairly successfully) for past 36 years.   Thank you for this consultation.  Our [facility] hospitalist team will follow the patient with you.    Nanami Whitelaw M. D.O. [facility] 12/17/2020, 9:16 PM

## 2020-12-17 NOTE — Progress Notes (Addendum)
   RN called me with concerns about patient's blood sugar. Patient is a type 1 diabetic. He takes [place] 65 units at night, Novolog 72 units TID, and Metformin 1,000mg  twice daily. Patient reports that his outpatient Insulin requirement was reduced by half when his Endocrinologist added the Metformin. He has been off Metformin this admission due to heart cath and his blood sugar has been hard to control.  He was started on Lantus 50 units at night today. Also on Novolog RESISTANT correction scale TID with additional 0-5 units at night as well as 10 units for meal coverage. He has not received Lantus dose yet. Blood sugars in the 270's to 320's range this evening. Diabetes coordinator was contacted earlier today and recommended going ahead and giving Lantus dose now. Patient is very concerned about his blood sugar and reports that he is just not going to eat tonight because he is worried his blood sugar will never come down if he does.   I had RN ask patient while I was on the phone with her how much insulin he would dose himself with tonight if he was at home. He reported he would take 50 units of Lantus as well as 25 units of Novolog now and then an additional 25 units in about 2 hours if he eats. I discussed with Dr. Bjorn Pippin and we are both comfortable with patient doing this tonight. He has an continuous blood glucose sensor so advised patient to watch blood sugars closely and notify us if he has any signs/symptoms of hypoglycemia.   Given we are going into the weekend, I will also go ahead and consult [facility] to help [place] with diabetes management. I have called them and am waiting for return call.  Corrin Parker, PA-C 12/17/2020 7:32 PM   Update:  Spoke with [facility] and they will see patient tonight.   Corrin Parker, PA-C 12/17/2020 7:45 PM

## 2020-12-17 NOTE — Consult Note (Signed)
[address]       [place] [address]             [phone]        [facility] Medical Record #[id number] Date of Birth: 08/12/1966  Referring: No ref. provider found Primary Care: [facility] Primary Cardiologist:No primary care provider on file.  Chief Complaint:    Chief Complaint  Patient presents with  . Chest Pain    History of Present Illness:    The patient is a 55 year old male we are asked to see in cardiac surgery consultation for consideration of CABG.  He has known significant comorbidities and cardiac risk factors including diabetes mellitus type 1, hypertension, hyperlipidemia, CVA  and morbid obesity with a BMI greater than 40.  He presented to the emergency department on 12/16/2020 with chest pain.  Approximately 2 hours prior to arrival in the emergency department he had an episode of heaviness in his chest after getting up.  It was not felt to be exertional in nature and there was no radiation or associated symptoms.  He denied shortness of breath.  He denied nausea or vomiting.  He has no history of tobacco use.  He has no significant family history of coronary artery disease.  Initial EKG showed sinus bradycardia with a heart rate of 54 with no acute ST, T wave abnormalities but there was a noted intraventricular conduction delay.  Initial high sensitive troponin was mildly elevated at 32 but significantly increased on second measurement to 893.  He was felt to require urgent cardiac catheterization and was taken to the Cath Lab where he was found to have significant coronary artery disease.  Please see the full report listed below.  He has significant left main, LAD, ramus intermedius and circumflex disease.  LV function by visual estimate was 35 to 45%.  Echocardiogram has been done and results are currently pending.  His wife is here with him today.  He works full-time as a Production designer, theatre/television/film for [employer].  He has a 42-year-old  son.  Past Medical History:  Diagnosis Date  . Diabetes mellitus without complication (HCC)   . HLD (hyperlipidemia)   . Hypertension   . Obesity   . Stroke [facility])     Past Surgical History:  Procedure Laterality Date  . EYE SURGERY    . LEFT HEART CATH AND CORONARY ANGIOGRAPHY N/A 12/16/2020   Procedure: LEFT HEART CATH AND CORONARY ANGIOGRAPHY;  Surgeon: Corky Crafts, MD;  Location: [facility] INVASIVE CV LAB;  Service: Cardiovascular;  Laterality: N/A;  . SHOULDER SURGERY      Social History   Tobacco Use  Smoking Status Never Smoker  Smokeless Tobacco Never Used    Social History   Substance and Sexual Activity  Alcohol Use Never     Allergies  Allergen Reactions  . Ace Inhibitors     Other reaction(s): Cough (ALLERGY/intolerance)  . Shellfish Allergy Rash    hives    Current Facility-Administered Medications  Medication Dose Route Frequency Provider Last Rate Last Admin  . 0.9 %  sodium chloride infusion   Intravenous PRN Corky Crafts, MD 10 mL/hr at 12/17/20 0525 Infusion Verify at 12/17/20 0525  . acetaminophen (TYLENOL) tablet 650 mg  650 mg Oral Q4H PRN Corky Crafts, MD      . aspirin EC tablet 81 mg  81 mg Oral Daily [facility], [place], [place]      .  atorvastatin (LIPITOR) tablet 80 mg  80 mg Oral Daily Georgie Chard D, NP      . heparin ADULT infusion 100 units/mL (25000 units/238mL)  1,300 Units/hr Intravenous Continuous Ann Held, [facility] 13 mL/hr at 12/17/20 0525 1,300 Units/hr at 12/17/20 0525  . insulin aspart (novoLOG) injection 0-20 Units  0-20 Units Subcutaneous TID WC Georgie Chard D, NP   7 Units at 12/17/20 [phone]  . insulin aspart (novoLOG) injection 0-5 Units  0-5 Units Subcutaneous QHS Georgie Chard D, NP      . insulin aspart (novoLOG) injection 10 Units  10 Units Subcutaneous TID WC Georgie Chard D, NP   10 Units at 12/17/20 [phone]  . insulin glargine (LANTUS) injection 50 Units  50 Units Subcutaneous QHS Georgie Chard D,  NP      . losartan (COZAAR) tablet 50 mg  50 mg Oral Daily Georgie Chard D, NP      . nitroGLYCERIN (NITROSTAT) SL tablet 0.4 mg  0.4 mg Sublingual Q5 Min x 3 PRN Lance Muss S, MD      . ondansetron [facility]) injection 4 mg  4 mg Intravenous Q6H PRN Corky Crafts, MD        Medications Prior to Admission  Medication Sig Dispense Refill Last Dose  . aspirin 81 MG EC tablet Take 81 mg by mouth daily.   12/16/2020 at Unknown time  . atenolol (TENORMIN) 100 MG tablet Take 100 mg by mouth daily.   12/16/2020 at 0630  . atorvastatin (LIPITOR) 40 MG tablet Take 1 tablet by mouth daily.   12/16/2020 at Unknown time  . Continuous Blood Gluc Sensor (DEXCOM G6 SENSOR) MISC Apply 1 sensor to the skin every 10 days for continuous glucose monitoring.     . diltiazem (CARDIZEM CD) 240 MG 24 hr capsule Take 480 mg by mouth daily.   12/16/2020 at Unknown time  . hydrochlorothiazide (HYDRODIURIL) 25 MG tablet Take 1 tablet by mouth daily.   12/16/2020 at Unknown time  . losartan (COZAAR) 100 MG tablet Take 1 tablet by mouth daily.   12/16/2020 at Unknown time  . metFORMIN (GLUCOPHAGE-XR) 500 MG 24 hr tablet Take 1,000 mg by mouth 2 (two) times daily.   12/16/2020 at Unknown time  . NOVOLOG 100 UNIT/ML injection Inject 10-30 Units into the skin 3 (three) times daily with meals. Sliding Scale   12/15/2020 at Unknown time  . TRESIBA FLEXTOUCH 200 UNIT/ML FlexTouch Pen Inject 50 Units into the skin in the morning.   12/16/2020 at Unknown time  . HUMALOG 100 UNIT/ML injection Inject into the skin 3 (three) times daily with meals. Sliding Scale     . TOUJEO MAX SOLOSTAR 300 UNIT/ML Solostar Pen Inject into the skin.       Family History  Problem Relation Age of Onset  . Hypertension Father      Review of Systems:   Review of Systems  Constitutional: Positive for malaise/fatigue. Negative for chills and fever.  HENT: Negative.   Eyes: Negative.   Respiratory: Negative for shortness of breath.    Cardiovascular: Positive for chest pain. Negative for orthopnea and leg swelling.  Gastrointestinal: Negative.   Genitourinary: Negative.   Musculoskeletal: Negative.   Skin: Negative.   Neurological: Negative for dizziness, focal weakness and seizures.  Endo/Heme/Allergies: Negative.   Psychiatric/Behavioral: Negative.       Physical Exam: BP (!) 134/52 (BP Location: Left Arm)   Pulse (!) 52   Temp 97.8 F (36.6 C) (Oral)  Resp 18   Ht 6' (1.829 m)   Wt 136 kg   SpO2 96%   BMI 40.66 kg/m    Physical Exam  Constitutional: No distress.  HENT:  Mouth/Throat: Oropharynx is clear.  Eyes: Pupils are equal, round, and reactive to light. Conjunctivae are normal.  Neck: No JVD present. No neck adenopathy.  Cardiovascular: Normal rate, regular rhythm, normal heart sounds, intact distal pulses and normal pulses.  No murmur heard. Pulmonary/Chest: Effort normal and breath sounds normal.  Abdominal: Soft. He exhibits no distension. There is no abdominal tenderness.  obese  Musculoskeletal:        General: No edema. Normal range of motion.     Cervical back: Normal range of motion and neck supple.  Neurological: He is alert and oriented to person, place, and time. He has normal motor skills and intact cranial nerves.  Skin: Skin is warm and dry.   Diagnostic Studies & Laboratory data:     Recent Radiology Findings:    LEFT HEART CATH AND CORONARY ANGIOGRAPHY    Conclusion    Ost LAD to Prox LAD lesion is 100% stenosed. Right to left collaterals are present.  Dist LM lesion is 60% stenosed.  Ramus lesion is 75% stenosed.  Ost Cx lesion is 60% stenosed.  The left ventricular ejection fraction is 35-45% by visual estimate.  There is mild to moderate left ventricular systolic dysfunction.  LV end diastolic pressure is mildly elevated.  There is no aortic valve stenosis.   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  Patient is hemodynamically  stable without chest pain.  Restart IV heparin in 8 hours.    Can add IV NTG if he has any discomfort or hypertension.     I discussed the findings with the patient and his wife.    Recommendations  Discharge Date Anticipated discharge date to be determined.   Indications  Non-ST elevation (NSTEMI) myocardial infarction (HCC) [I21.4 (ICD-10-CM)]   Procedural Details  Technical Details The risks, benefits, and details of the procedure were explained to the patient.  The patient verbalized understanding and wanted to proceed.  Informed written consent was obtained.  PROCEDURE TECHNIQUE:  After Xylocaine anesthesia a 42F slender sheath was placed in the right radial artery with a single anterior needle wall stick using ultrasound guidance.   IV Heparin was given.  Left ventriculography was done using a JR4 catheter.  Right coronary angiography was done using a Judkins R4 guide catheter.  Left coronary angiography was done using a Judkins L3.5 guide catheter.    A TR band was used for hemostasis.  Contrast: 55 cc  Estimated blood loss <50 mL.   During this procedure medications were administered to achieve and maintain moderate conscious sedation while the patient's heart rate, blood pressure, and oxygen saturation were continuously monitored and I was present face-to-face 100% of this time.   Medications (Filter: Administrations occurring from 1622 to 1710 on 12/16/20) (important) Continuous medications are totaled by the amount administered until 12/16/20 1710.    midazolam (VERSED) injection (mg) Total dose:  2 mg  Date/Time Rate/Dose/Volume Action   12/16/20 1640 2 mg Given    fentaNYL (SUBLIMAZE) injection (mcg) Total dose:  50 mcg  Date/Time Rate/Dose/Volume Action   12/16/20 1640 50 mcg Given    lidocaine (PF) (XYLOCAINE) 1 % injection (mL) Total volume:  3 mL  Date/Time Rate/Dose/Volume Action   12/16/20 1646 3 mL Given    Heparin (Porcine) in NaCl  1000-0.9 UT/500ML-% SOLN (mL) Total volume:  1,000 mL  Date/Time Rate/Dose/Volume Action   12/16/20 1646 500 mL Given   1646 500 mL Given    Radial Cocktail/Verapamil only (mL) Total volume:  10 mL  Date/Time Rate/Dose/Volume Action   12/16/20 1648 10 mL Given    heparin sodium (porcine) injection (Units) Total dose:  7,000 Units  Date/Time Rate/Dose/Volume Action   12/16/20 1649 7,000 Units Given    iohexol (OMNIPAQUE) 350 MG/ML injection (mL) Total volume:  55 mL  Date/Time Rate/Dose/Volume Action   12/16/20 1704 55 mL Given    insulin pump Total dose:  Cannot be calculated* Dosing weight:  136.1  *Administration dose not documented Date/Time Rate/Dose/Volume Action   12/16/20 1624 *Not included in total MAR Unhold    Sedation Time  Sedation Time Physician-1: 18 minutes 8 seconds   Contrast  Medication Name Total Dose  iohexol (OMNIPAQUE) 350 MG/ML injection 55 mL    Radiation/Fluoro  Fluoro time: 2.4 (min) DAP: 31.6 (Gycm2) Cumulative Air Kerma: 741.6 (mGy)   Complications   Complications documented before study signed (12/16/2020 5:39 PM)    No complications were associated with this study.  Documented by Corky Crafts, MD - 12/16/2020 5:34 PM     Coronary Findings   Diagnostic Dominance: Co-dominant  Left Main  Dist LM lesion is 60% stenosed.  Left Anterior Descending  Collaterals  Dist LAD filled by collaterals from RPDA.    Ost LAD to Prox LAD lesion is 100% stenosed.  Ramus Intermedius  Ramus lesion is 75% stenosed.  Left Circumflex  Ost Cx lesion is 60% stenosed.  Right Coronary Artery  The vessel exhibits minimal luminal irregularities.   Intervention   No interventions have been documented.  Wall Motion  Resting                Left Heart  Left Ventricle The left ventricular size is in the upper limits of normal. There is mild to moderate left ventricular systolic dysfunction. LV end diastolic pressure is  mildly elevated. The left ventricular ejection fraction is 35-45% by visual estimate.  Aortic Valve There is no aortic valve stenosis.   Coronary Diagrams   Diagnostic Dominance: Co-dominant          I have independently reviewed the above radiologic studies and discussed with the patient   Recent Lab Findings: Lab Results  Component Value Date   WBC 9.5 12/17/2020   HGB 14.5 12/17/2020   HCT 44.0 12/17/2020   PLT 281 12/17/2020   GLUCOSE 267 (H) 12/17/2020   CHOL 156 12/17/2020   TRIG 94 12/17/2020   HDL 34 (L) 12/17/2020   LDLCALC 103 (H) 12/17/2020   NA 136 12/17/2020   K 4.0 12/17/2020   CL 102 12/17/2020   CREATININE 0.89 12/17/2020   BUN 17 12/17/2020   CO2 25 12/17/2020   HGBA1C 8.2 (H) 12/17/2020   CARDIAC CATHETERIZATION  Result Date: 12/16/2020  Ost LAD to Prox LAD lesion is 100% stenosed. Right to left collaterals are present.  Dist LM lesion is 60% stenosed.  Ramus lesion is 75% stenosed.  Ost Cx lesion is 60% stenosed.  The left ventricular ejection fraction is 35-45% by visual estimate.  There is mild to moderate left ventricular systolic dysfunction.  LV end diastolic pressure is mildly elevated.  There is no aortic valve stenosis.  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  Patient is hemodynamically stable without chest pain. Restart IV heparin in 8 hours.  Can add IV NTG if he has any discomfort or hypertension.   I discussed the findings with the patient and his wife.   DG Chest Port 1 View  Result Date: 12/16/2020 CLINICAL DATA:  Chest pain and pressure beginning today. EXAM: PORTABLE CHEST 1 VIEW COMPARISON:  None. FINDINGS: Artifact overlies the chest. Heart size is normal. Mediastinal shadows are normal. The lungs are clear. No edema or effusion. No focal bone finding. IMPRESSION: No active disease. Electronically Signed   By: Paulina Fusi M.D.   On: 12/16/2020 08:49   Echocardiogram - results pending  Assessment /  Plan:      This 55 year old gentleman with type 1 diabetes and dyslipidemia presents with a non-ST segment elevation MI.  Cardiac catheterization shows the LAD to be occluded at the ostium with faint right to left collaterals.  The distal left main has about 60% stenosis and divides into a large ramus branch that has 75% ostial stenosis and a left circumflex that has 60% ostial stenosis before a large marginal branch.  Left ventricular ejection fraction is visually 35 to 45% with mildly elevated LVEDP of 18.  I agree that coronary artery bypass graft surgery is the best treatment for this patient. I discussed the operative procedure with the patient and his wife including alternatives, benefits and risks; including but not limited to bleeding, blood transfusion, infection, stroke, myocardial infarction, graft failure, heart block requiring a permanent pacemaker, organ dysfunction, and death.  Timothy Golden understands and agrees to proceed.  We will schedule surgery for Monday am.   I spent 60 minutes performing this consultation and > 50% of this time was spent face to face counseling and coordinating the care of this patient's severe multivessel coronary disease.   Alleen Borne, MD 12/17/2020

## 2020-12-17 NOTE — Progress Notes (Signed)
Removed TR band from right radial. Site is a level 0. Applied gauze and Tegaderm to site. Educated pt to leave in place for 24 hrs. Pt verbalized understanding. °

## 2020-12-17 NOTE — Progress Notes (Addendum)
Progress Note  Patient Name: Timothy Golden Date of Encounter: 12/17/2020  Primary Cardiologist: Dr. Eldridge Dace, MD   Subjective   Doing well today no chest pain, SOB. Wants to take a shower. Awaiting TCTS given LM disease   Inpatient Medications    Scheduled Meds: . aspirin EC  81 mg Oral Daily  . atorvastatin  40 mg Oral Daily  . insulin aspart  0-15 Units Subcutaneous TID WC  . insulin aspart  0-5 Units Subcutaneous QHS   Continuous Infusions: . sodium chloride 10 mL/hr at 12/17/20 0525  . heparin 1,300 Units/hr (12/17/20 0525)   PRN Meds: sodium chloride, acetaminophen, nitroGLYCERIN, ondansetron (ZOFRAN) IV   Vital Signs    Vitals:   12/16/20 1840 12/16/20 1855 12/17/20 0015 12/17/20 0500  BP: (!) 163/62 (!) 161/55 (!) 163/59 (!) 134/52  Pulse: (!) 48 (!) 49 (!) 51 (!) 52  Resp:   18 18  Temp:   98.9 F (37.2 C) 98.2 F (36.8 C)  TempSrc:   Oral Oral  SpO2: 96% 95%  96%  Weight:    136 kg  Height:        Intake/Output Summary (Last 24 hours) at 12/17/2020 0647 Last data filed at 12/17/2020 0525 Gross per 24 hour  Intake 243.97 ml  Output 700 ml  Net -456.03 ml   Filed Weights   12/16/20 0814 12/17/20 0500  Weight: 136.1 kg 136 kg    Physical Exam   General: Obese, NAD Neck: Negative for carotid bruits. No JVD Lungs:Clear to ausculation bilaterally. No wheezes, rales, or rhonchi. Breathing is unlabored. Cardiovascular: RRR with S1 S2. No murmurs Abdomen: Soft, non-tender, non-distended. No obvious abdominal masses. Extremities: No edema. Radial pulses 2+ bilaterally Neuro: Alert and oriented. No focal deficits. No facial asymmetry. MAE spontaneously. Psych: Responds to questions appropriately with normal affect.    Labs    Chemistry Recent Labs  Lab 12/16/20 0833  NA 138  K 3.9  CL 104  CO2 21*  GLUCOSE 249*  BUN 28*  CREATININE 0.87  CALCIUM 9.9  GFRNONAA >60  ANIONGAP 13     Hematology Recent Labs  Lab 12/16/20 0833  WBC  11.3*  RBC 5.22  HGB 14.5  HCT 44.8  MCV 85.8  MCH 27.8  MCHC 32.4  RDW 13.6  PLT 299    Cardiac EnzymesNo results for input(s): TROPONINI in the last 168 hours. No results for input(s): TROPIPOC in the last 168 hours.   BNPNo results for input(s): BNP, PROBNP in the last 168 hours.   DDimer No results for input(s): DDIMER in the last 168 hours.   Radiology    CARDIAC CATHETERIZATION  Result Date: 12/16/2020  Ost LAD to Prox LAD lesion is 100% stenosed. Right to left collaterals are present.  Dist LM lesion is 60% stenosed.  Ramus lesion is 75% stenosed.  Ost Cx lesion is 60% stenosed.  The left ventricular ejection fraction is 35-45% by visual estimate.  There is mild to moderate left ventricular systolic dysfunction.  LV end diastolic pressure is mildly elevated.  There is no aortic valve stenosis.  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  Patient is hemodynamically stable without chest pain. Restart IV heparin in 8 hours.  Can add IV NTG if he has any discomfort or hypertension.   I discussed the findings with the patient and his wife.   DG Chest Port 1 View  Result Date: 12/16/2020 CLINICAL DATA:  Chest pain and pressure  beginning today. EXAM: PORTABLE CHEST 1 VIEW COMPARISON:  None. FINDINGS: Artifact overlies the chest. Heart size is normal. Mediastinal shadows are normal. The lungs are clear. No edema or effusion. No focal bone finding. IMPRESSION: No active disease. Electronically Signed   By: Paulina Fusi M.D.   On: 12/16/2020 08:49   Telemetry    12/17/20 SB with HR in the 50's - Personally Reviewed  ECG    No new tracing as of 12/17/20- Personally Reviewed  Cardiac Studies   LHC 12/16/20:   Ost LAD to Prox LAD lesion is 100% stenosed. Right to left collaterals are present.  Dist LM lesion is 60% stenosed.  Ramus lesion is 75% stenosed.  Ost Cx lesion is 60% stenosed.  The left ventricular ejection fraction is 35-45% by visual  estimate.  There is mild to moderate left ventricular systolic dysfunction.  LV end diastolic pressure is mildly elevated.  There is no aortic valve stenosis.   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  Patient is hemodynamically stable without chest pain.  Restart IV heparin in 8 hours.    Can add IV NTG if he has any discomfort or hypertension.    Diagnostic Dominance: Co-dominant     Patient Profile     55 y.o. male with a hx of DM1, HTN, HLD, and morbid obesity with a BMI > 40 who presented to [facility] ED 12/16/20 with chest pain.  Assessment & Plan    1. NSTEMI: -Presented to [facility] ED 12/16/20 with chest pain found to have an elevated HsT from 32>>893. EKG stable with no acute changes. Plan was for transfer and admission to [facility] for LHC for further coronary evaluation.  -Cath showed complex distal left main disease with recommendations for TCTS consultation for possible revascularization  -Remains chest pain free at this time -Continue ASA, statin -Up-titrate atorvastatin to 80mg  QD>>lipid panel pending   -No beta blocker in the setting of bradycardia  -Continue Heparin post cath given LM disease -Check echo today  -Denies chest pain   2. DM1: -Follows closely with OP endocrinology  -Seen by DM coordinator>>appreciate their recommendations -Will start Lantus 50u QHS and resistant SSI with meal coverage at 0-5u with an additional 10u TID with meals.  -HbA1c pending   3. HTN: -Stable, 134/52>>163/59>>161/55 -Restart home medications losartan>>will start at 50mg  QD -Hold home atenolol 100, diltiazem 240 due to bradycardia  -May need additional antihypertensives>>follow   4. HLD: -Unable to find most recent Lipid panel in care everywhere -Obtain updated Lipid panel with LFTs -Restart home atorvastatin>>up titrate given significant CAD  5. Morbid obesity: -BMI >40kg/m2  Signed, NP-C [facility] Pager: [phone] 12/17/2020, 6:47  AM     For questions or updates, please contact   Please consult [email or web address] for contact info under Cardiology/STEMI.  Patient seen, examined. Available data reviewed. Agree with findings, assessment, and plan as outlined by [phone], NP-C.  The patient is independently interviewed and examined.  He is alert, oriented, in no distress.  Lungs are clear, heart is regular rate and rhythm with no murmur gallop, abdomen is soft, obese, nontender, extremities have no edema, skin is warm and dry with no rash.  Cardiac catheterization films are reviewed and demonstrate severe distal left main disease and total occlusion of the proximal LAD, collateralized by the distal branches of the RCA.  The patient has undergone cardiac surgical evaluation with plans for coronary bypass surgery by Dr. 12/19/2020 on Monday.  His consultation note is  reviewed.  We will continue the patient's current medical program as outlined above.  Discussed management in depth with the patient and his wife who is at the bedside today.  Tonny Bollman, M.D. 12/17/2020 1:28 PM

## 2020-12-17 NOTE — Progress Notes (Signed)
ANTICOAGULATION CONSULT NOTE - Follow-Up Consult  Pharmacy Consult for heparin Indication: chest pain/ACS  Patient Measurements: Height: 6' (182.9 cm) Weight: 136 kg (299 lb 12.8 oz) IBW/kg (Calculated) : 77.6 Heparin Dosing Weight: 108.7kg  Vital Signs: Temp: 97.9 F (36.6 C) (04/29 1628) Temp Source: Oral (04/29 1628) BP: 143/54 (04/29 1628) Pulse Rate: 62 (04/29 1628)  Labs: Recent Labs    12/16/20 0833 12/16/20 1054 12/17/20 0739 12/17/20 1610  HGB 14.5  --  14.5  --   HCT 44.8  --  44.0  --   PLT 299  --  281  --   HEPARINUNFRC  --   --  <0.10* 0.14*  CREATININE 0.87  --  0.89  --   TROPONINIHS 32* 893*  --   --     Estimated Creatinine Clearance: 134 mL/min (by C-G formula based on SCr of 0.89 mg/dL).   Medications:  Infusions:  . sodium chloride 10 mL/hr at 12/17/20 0525  . [START ON 12/20/2020] cefUROXime (ZINACEF)  IV    . [START ON 12/20/2020] cefUROXime (ZINACEF)  IV    . [START ON 12/20/2020] dexmedetomidine    . [START ON 12/20/2020] heparin 30,000 units/NS 1000 mL solution for CELLSAVER    . heparin 1,600 Units/hr (12/17/20 1213)  . [START ON 12/20/2020] milrinone    . [START ON 12/20/2020] nitroGLYCERIN    . [START ON 12/20/2020] norepinephrine    . [START ON 12/20/2020] tranexamic acid (CYKLOKAPRON) infusion ([facility])    . [START ON 12/20/2020] tranexamic acid (CYKLOKAPRON) infusion ([facility])    . [START ON 12/20/2020] vancomycin      Assessment: 15 YOM who presented with CP/NSTEMI and is now s/p cath which showed complex distal main disease - consulting CVTS. Now planned for surgery on Mon, 5/2.  Heparin level this evening remains SUBtherapeutic though trending up after a rate increase earlier today (HL 0.14, goal of 0.3-0.7). No bleeding or issues noted per discussion with RN.   Goal of Therapy:  Heparin level 0.3-0.7 units/ml Monitor platelets by anticoagulation protocol: Yes   Plan:  - Increase heparin infusion to 1950 units/hr - Will continue to monitor for any  signs/symptoms of bleeding and will follow up with heparin level in 6 hours   Thank you for allowing pharmacy to be a part of this patient's care.  Georgina Pillion, PharmD, BCPS Clinical Pharmacist Clinical phone for 12/17/2020: [phone] 12/17/2020 5:09 PM   **Pharmacist phone directory can now be found on [email or web address] ([email or web address]).  Listed under [facility].

## 2020-12-18 ENCOUNTER — Inpatient Hospital Stay (HOSPITAL_COMMUNITY): Payer: 59

## 2020-12-18 DIAGNOSIS — I214 Non-ST elevation (NSTEMI) myocardial infarction: Secondary | ICD-10-CM | POA: Diagnosis not present

## 2020-12-18 DIAGNOSIS — Z01810 Encounter for preprocedural cardiovascular examination: Secondary | ICD-10-CM | POA: Diagnosis not present

## 2020-12-18 DIAGNOSIS — I6523 Occlusion and stenosis of bilateral carotid arteries: Secondary | ICD-10-CM

## 2020-12-18 LAB — HEPARIN LEVEL (UNFRACTIONATED)
Heparin Unfractionated: 0.14 IU/mL — ABNORMAL LOW (ref 0.30–0.70)
Heparin Unfractionated: 0.39 IU/mL (ref 0.30–0.70)

## 2020-12-18 LAB — CBC
HCT: 41.4 % (ref 39.0–52.0)
Hemoglobin: 13.5 g/dL (ref 13.0–17.0)
MCH: 27.9 pg (ref 26.0–34.0)
MCHC: 32.6 g/dL (ref 30.0–36.0)
MCV: 85.5 fL (ref 80.0–100.0)
Platelets: 244 10*3/uL (ref 150–400)
RBC: 4.84 MIL/uL (ref 4.22–5.81)
RDW: 13.6 % (ref 11.5–15.5)
WBC: 9 10*3/uL (ref 4.0–10.5)
nRBC: 0 % (ref 0.0–0.2)

## 2020-12-18 LAB — GLUCOSE, CAPILLARY
Glucose-Capillary: 235 mg/dL — ABNORMAL HIGH (ref 70–99)
Glucose-Capillary: 107 mg/dL — ABNORMAL HIGH (ref 70–99)
Glucose-Capillary: 149 mg/dL — ABNORMAL HIGH (ref 70–99)
Glucose-Capillary: 261 mg/dL — ABNORMAL HIGH (ref 70–99)

## 2020-12-18 MED ORDER — INSULIN ASPART 100 UNIT/ML IJ SOLN: 10.0000 [IU] | Freq: Three times a day (TID) | INTRAMUSCULAR | Status: DC

## 2020-12-18 MED ORDER — INSULIN ASPART 100 UNIT/ML IJ SOLN
20.0000 [IU] | Freq: Three times a day (TID) | INTRAMUSCULAR | Status: DC
  Administered 2020-12-18: 20 [IU] via SUBCUTANEOUS

## 2020-12-18 MED ORDER — IOHEXOL 350 MG/ML SOLN
75.0000 mL | Freq: Once | INTRAVENOUS | Status: AC | PRN
  Administered 2020-12-18: 75 mL via INTRAVENOUS

## 2020-12-18 MED ORDER — INSULIN GLARGINE 100 UNIT/ML ~~LOC~~ SOLN
25.0000 [IU] | Freq: Two times a day (BID) | SUBCUTANEOUS | Status: DC
  Administered 2020-12-18 (×2): 25 [IU] via SUBCUTANEOUS
  Filled 2020-12-18 (×4): qty 0.25

## 2020-12-18 NOTE — Progress Notes (Signed)
PROGRESS NOTE                                                                                                                                                                                                             Patient Demographics:    Timothy Golden, is a 55 y.o. male, DOB - 10/13/1965, JKK:[id number]  Outpatient Primary MD for the patient is [facility], [facility]    LOS - 1  Admit date - 12/16/2020    Chief Complaint  Patient presents with  . Chest Pain       Brief Narrative (HPI from H&P) Timothy Golden is an 55 y.o. male with H/O DM1 since age 61.  He is admitted to hospital currently by Cardiology for NSTEMI and planned to undergo CABG on Monday, hospitalist team was consulted to manage diabetes.   Subjective:    Timothy Golden today has, No headache, No chest pain, No abdominal pain - No Nausea, No new weakness tingling or numbness, no SOB   Assessment  & Plan :     1. DM2  - now insulin-dependent, poor outpatient control A1c was 8.2, question dietary compliance as well.  Placed on Lantus twice daily along with sliding scale, will monitor closely, will decrease the dose prior to surgery as he will be n.p.o.  Lab Results  Component Value Date   HGBA1C 8.2 (H) 12/17/2020   CBG (last 3)  Recent Labs    12/17/20 2145 12/18/20 0805 12/18/20 1108  GLUCAP 214* 235* 107*      2. NSTEMI - per primary Cards team         Condition - Extremely Guarded  Family Communication  : per Primary team Cards  Code Status :  Full  Consults  : [facility] consulting    Procedures  :             DVT Prophylaxis  :  Per primary team    Lab Results  Component Value Date   PLT 244 12/18/2020    Diet :  Diet Order            Diet Carb Modified Fluid consistency: Thin; Room service appropriate? Yes  Diet effective now                  Inpatient Medications  Scheduled Meds: .  aspirin EC  81  mg Oral Daily  . atorvastatin  80 mg Oral Daily  . [START ON 12/20/2020] epinephrine  0-10 mcg/min Intravenous To OR  . [START ON 12/20/2020] heparin-papaverine-plasmalyte irrigation   Irrigation To OR  . insulin aspart  0-20 Units Subcutaneous TID WC  . insulin aspart  0-30 Units Subcutaneous QHS  . insulin glargine  25 Units Subcutaneous BID  . [START ON 12/20/2020] insulin   Intravenous To OR  . losartan  50 mg Oral Daily  . [START ON 12/20/2020] magnesium sulfate  40 mEq Other To OR  . [START ON 12/20/2020] phenylephrine  30-200 mcg/min Intravenous To OR  . [START ON 12/20/2020] potassium chloride  80 mEq Other To OR  . [START ON 12/20/2020] tranexamic acid  15 mg/kg Intravenous To OR  . [START ON 12/20/2020] tranexamic acid  2 mg/kg Intracatheter To OR   Continuous Infusions: . sodium chloride 10 mL/hr at 12/17/20 2224  . [START ON 12/20/2020] cefUROXime (ZINACEF)  IV    . [START ON 12/20/2020] cefUROXime (ZINACEF)  IV    . [START ON 12/20/2020] dexmedetomidine    . [START ON 12/20/2020] heparin 30,000 units/NS 1000 mL solution for CELLSAVER    . heparin 2,200 Units/hr (12/18/20 0340)  . [START ON 12/20/2020] milrinone    . [START ON 12/20/2020] nitroGLYCERIN    . [START ON 12/20/2020] norepinephrine    . [START ON 12/20/2020] tranexamic acid (CYKLOKAPRON) infusion ([facility])    . [START ON 12/20/2020] tranexamic acid (CYKLOKAPRON) infusion ([facility])    . [START ON 12/20/2020] vancomycin     PRN Meds:.sodium chloride, acetaminophen, nitroGLYCERIN, ondansetron (ZOFRAN) IV  Antibiotics  :    Anti-infectives (From admission, onward)   Start     Dose/Rate Route Frequency Ordered Stop   12/20/20 0400  vancomycin (VANCOREADY) IVPB 1500 mg/300 mL        1,500 mg 150 mL/hr over 120 Minutes Intravenous To Surgery 12/17/20 1248 12/21/20 0400   12/20/20 0400  cefUROXime (ZINACEF) 1.5 g in sodium chloride 0.9 % 100 mL IVPB        1.5 g 200 mL/hr over 30 Minutes Intravenous To Surgery 12/17/20 1248 12/21/20 0400   12/20/20 0400   cefUROXime (ZINACEF) 750 mg in sodium chloride 0.9 % 100 mL IVPB        750 mg 200 mL/hr over 30 Minutes Intravenous To Surgery 12/17/20 1248 12/21/20 0400       Time Spent in minutes  30   Susa RaringPrashant Ether Goebel M.D on 12/18/2020 at 11:20 AM  To page go to [email or web address]   [facility] -  Office  [phone]     See all Orders from today for further details    Objective:   Vitals:   12/17/20 1628 12/17/20 2100 12/18/20 0632 12/18/20 0724  BP: (!) 143/54 (!) 144/59 (!) 144/60 (!) 142/37  Pulse: 62 64 61 61  Resp: 18 18 18 18   Temp: 97.9 F (36.6 C) 98.8 F (37.1 C) 98.6 F (37 C) 97.6 F (36.4 C)  TempSrc: Oral Oral Oral Oral  SpO2: 93% 95%  94%  Weight:   135.3 kg   Height:        Wt Readings from Last 3 Encounters:  12/18/20 135.3 kg     Intake/Output Summary (Last 24 hours) at 12/18/2020 1120 Last data filed at 12/18/2020 0835 Gross per 24 hour  Intake 499.32 ml  Output --  Net 499.32 ml     Physical Exam  Awake Alert, No  new F.N deficits, Normal affect [place].AT,PERRAL Supple Neck,No JVD, No cervical lymphadenopathy appriciated.  Symmetrical Chest wall movement, Good air movement bilaterally, CTAB RRR,No Gallops,Rubs or new Murmurs, No Parasternal Heave +ve B.Sounds, Abd Soft, No tenderness, No organomegaly appriciated, No rebound - guarding or rigidity. No Cyanosis, Clubbing or edema, No new Rash or bruise      Data Review:    CBC Recent Labs  Lab 12/16/20 0833 12/17/20 0739 12/18/20 0024  WBC 11.3* 9.5 9.0  HGB 14.5 14.5 13.5  HCT 44.8 44.0 41.4  PLT 299 281 244  MCV 85.8 85.1 85.5  MCH 27.8 28.0 27.9  MCHC 32.4 33.0 32.6  RDW 13.6 13.5 13.6    Recent Labs  Lab 12/16/20 0833 12/16/20 1807 12/17/20 0739  NA 138  --  136  K 3.9  --  4.0  CL 104  --  102  CO2 21*  --  25  GLUCOSE 249*  --  267*  BUN 28*  --  17  CREATININE 0.87  --  0.89  CALCIUM 9.9  --  9.1  HGBA1C  --    < > 8.2*   < > = values in this interval not  displayed.    ------------------------------------------------------------------------------------------------------------------ Recent Labs    12/17/20 0739  CHOL 156  HDL 34*  LDLCALC 103*  TRIG 94  CHOLHDL 4.6    Lab Results  Component Value Date   HGBA1C 8.2 (H) 12/17/2020   ------------------------------------------------------------------------------------------------------------------ No results for input(s): TSH, T4TOTAL, T3FREE, THYROIDAB in the last 72 hours.  Invalid input(s): FREET3  Cardiac Enzymes No results for input(s): CKMB, TROPONINI, MYOGLOBIN in the last 168 hours.  Invalid input(s): CK ------------------------------------------------------------------------------------------------------------------ No results found for: BNP  Micro Results Recent Results (from the past 240 hour(s))  Resp Panel by RT-PCR (Flu A&B, Covid) Nasopharyngeal Swab     Status: None   Collection Time: 12/16/20 12:44 PM   Specimen: Nasopharyngeal Swab; Nasopharyngeal(NP) swabs in vial transport medium  Result Value Ref Range Status   SARS Coronavirus 2 by RT PCR NEGATIVE NEGATIVE Final    Comment: (NOTE) SARS-CoV-2 target nucleic acids are NOT DETECTED.  The SARS-CoV-2 RNA is generally detectable in upper respiratory specimens during the acute phase of infection. The lowest concentration of SARS-CoV-2 viral copies this assay can detect is 138 copies/mL. A negative result does not preclude SARS-Cov-2 infection and should not be used as the sole basis for treatment or other patient management decisions. A negative result may occur with  improper specimen collection/handling, submission of specimen other than nasopharyngeal swab, presence of viral mutation(s) within the areas targeted by this assay, and inadequate number of viral copies(<138 copies/mL). A negative result must be combined with clinical observations, patient history, and epidemiological information. The expected  result is Negative.  Fact Sheet for Patients:  BloggerCourse.com  Fact Sheet for Healthcare Providers:  SeriousBroker.it  This test is no t yet approved or cleared by the [place] FDA and  has been authorized for detection and/or diagnosis of SARS-CoV-2 by FDA under an Emergency Use Authorization (EUA). This EUA will remain  in effect (meaning this test can be used) for the duration of the COVID-19 declaration under Section 564(b)(1) of the Act, 21 U.S.C.section 360bbb-3(b)(1), unless the authorization is terminated  or revoked sooner.       Influenza A by PCR NEGATIVE NEGATIVE Final   Influenza B by PCR NEGATIVE NEGATIVE Final    Comment: (NOTE) The Xpert Xpress SARS-CoV-2/FLU/RSV plus assay is intended as  an aid in the diagnosis of influenza from Nasopharyngeal swab specimens and should not be used as a sole basis for treatment. Nasal washings and aspirates are unacceptable for Xpert Xpress SARS-CoV-2/FLU/RSV testing.  Fact Sheet for Patients: BloggerCourse.com  Fact Sheet for Healthcare Providers: SeriousBroker.it  This test is not yet approved or cleared by the [place] FDA and has been authorized for detection and/or diagnosis of SARS-CoV-2 by FDA under an Emergency Use Authorization (EUA). This EUA will remain in effect (meaning this test can be used) for the duration of the COVID-19 declaration under Section 564(b)(1) of the Act, 21 U.S.C. section 360bbb-3(b)(1), unless the authorization is terminated or revoked.  Performed at [facility], [address], [place], [place] [address]     Radiology Reports CARDIAC CATHETERIZATION  Result Date: 12/16/2020  Ost LAD to Prox LAD lesion is 100% stenosed. Right to left collaterals are present.  Dist LM lesion is 60% stenosed.  Ramus lesion is 75% stenosed.  Ost Cx lesion is 60% stenosed.   The left ventricular ejection fraction is 35-45% by visual estimate.  There is mild to moderate left ventricular systolic dysfunction.  LV end diastolic pressure is mildly elevated.  There is no aortic valve stenosis.  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  Patient is hemodynamically stable without chest pain. Restart IV heparin in 8 hours.  Can add IV NTG if he has any discomfort or hypertension.   I discussed the findings with the patient and his wife.   DG Chest Port 1 View  Result Date: 12/16/2020 CLINICAL DATA:  Chest pain and pressure beginning today. EXAM: PORTABLE CHEST 1 VIEW COMPARISON:  None. FINDINGS: Artifact overlies the chest. Heart size is normal. Mediastinal shadows are normal. The lungs are clear. No edema or effusion. No focal bone finding. IMPRESSION: No active disease. Electronically Signed   By: Paulina Fusi M.D.   On: 12/16/2020 08:49   ECHOCARDIOGRAM COMPLETE  Result Date: 12/17/2020    ECHOCARDIOGRAM REPORT   Patient Name:   Timothy Golden Date of Exam: 12/17/2020 Medical Rec #:  [id number]    Height:       72.0 in Accession #:    [id number]   Weight:       299.8 lb Date of Birth:  April 07, 1966    BSA:          2.531 m Patient Age:    55 years     BP:           134/52 mmHg Patient Gender: M            HR:           49 bpm. Exam Location:  Inpatient Procedure: 2D Echo, Color Doppler and Cardiac Doppler Indications:    NSTEMI I21.4  History:        Patient has no prior history of Echocardiogram examinations.                 Stroke; Risk Factors:Hypertension, Diabetes and Dyslipidemia.  Sonographer:    Eulah Pont RDCS Referring Phys: [phone] JILL D MCDANIEL IMPRESSIONS  1. Left ventricular ejection fraction, by estimation, is 60 to 65%. The left ventricle has normal function. The left ventricle has no regional wall motion abnormalities. Left ventricular diastolic parameters are consistent with Grade II diastolic dysfunction (pseudonormalization). Elevated left  ventricular end-diastolic pressure.  2. Right ventricular systolic function is normal. The right ventricular size is normal. There is normal pulmonary artery systolic pressure.  3. Left atrial size was moderately dilated.  4. The mitral valve is normal in structure. Trivial mitral valve regurgitation. No evidence of mitral stenosis.  5. The aortic valve is tricuspid. Aortic valve regurgitation is not visualized. No aortic stenosis is present.  6. Aortic dilatation noted. There is mild dilatation of the ascending aorta, measuring 37 mm.  7. The inferior vena cava is normal in size with greater than 50% respiratory variability, suggesting right atrial pressure of 3 mmHg. FINDINGS  Left Ventricle: Left ventricular ejection fraction, by estimation, is 60 to 65%. The left ventricle has normal function. The left ventricle has no regional wall motion abnormalities. The left ventricular internal cavity size was normal in size. There is  no left ventricular hypertrophy. Left ventricular diastolic parameters are consistent with Grade II diastolic dysfunction (pseudonormalization). Elevated left ventricular end-diastolic pressure. Right Ventricle: The right ventricular size is normal. No increase in right ventricular wall thickness. Right ventricular systolic function is normal. There is normal pulmonary artery systolic pressure. The tricuspid regurgitant velocity is 1.42 m/s, and  with an assumed right atrial pressure of 3 mmHg, the estimated right ventricular systolic pressure is 11.1 mmHg. Left Atrium: Left atrial size was moderately dilated. Right Atrium: Right atrial size was normal in size. Pericardium: There is no evidence of pericardial effusion. Mitral Valve: The mitral valve is normal in structure. Trivial mitral valve regurgitation. No evidence of mitral valve stenosis. Tricuspid Valve: The tricuspid valve is normal in structure. Tricuspid valve regurgitation is trivial. No evidence of tricuspid stenosis. Aortic  Valve: The aortic valve is tricuspid. Aortic valve regurgitation is not visualized. No aortic stenosis is present. Pulmonic Valve: The pulmonic valve was normal in structure. Pulmonic valve regurgitation is not visualized. No evidence of pulmonic stenosis. Aorta: Aortic dilatation noted. There is mild dilatation of the ascending aorta, measuring 37 mm. Venous: The inferior vena cava is normal in size with greater than 50% respiratory variability, suggesting right atrial pressure of 3 mmHg. IAS/Shunts: No atrial level shunt detected by color flow Doppler.  LEFT VENTRICLE PLAX 2D LVIDd:         5.20 cm  Diastology LVIDs:         3.20 cm  LV e' medial:    4.87 cm/s LV PW:         0.90 cm  LV E/e' medial:  16.9 LV IVS:        0.80 cm  LV e' lateral:   11.90 cm/s LVOT diam:     2.10 cm  LV E/e' lateral: 6.9 LV SV:         81 LV SV Index:   32 LVOT Area:     3.46 cm  RIGHT VENTRICLE RV S prime:     10.60 cm/s TAPSE (M-mode): 2.0 cm LEFT ATRIUM             Index       RIGHT ATRIUM           Index LA diam:        3.60 cm 1.42 cm/m  RA Area:     12.20 cm LA Vol (A2C):   45.2 ml 17.86 ml/m RA Volume:   19.90 ml  7.86 ml/m LA Vol (A4C):   88.8 ml 35.09 ml/m LA Biplane Vol: 70.7 ml 27.94 ml/m  AORTIC VALVE LVOT Vmax:   90.60 cm/s LVOT Vmean:  59.800 cm/s LVOT VTI:    0.235 m  AORTA Ao Root diam: 3.40 cm Ao Asc diam:  3.70 cm MITRAL VALVE               TRICUSPID VALVE MV Area (PHT): 2.08 cm    TR Peak grad:   8.1 mmHg MV Decel Time: 364 msec    TR Vmax:        142.00 cm/s MV E velocity: 82.50 cm/s MV A velocity: 34.90 cm/s  SHUNTS MV E/A ratio:  2.36        Systemic VTI:  0.24 m                            Systemic Diam: 2.10 cm Chilton Si MD Electronically signed by Chilton Si MD Signature Date/Time: 12/17/2020/11:46:42 AM    Final    VAS US DOPPLER PRE CABG  Result Date: 12/18/2020 PREOPERATIVE VASCULAR EVALUATION Patient Name:  Timothy Golden  Date of Exam:   12/18/2020 Medical Rec #: [id number]      Accession #:    [id number] Date of Birth: [birth year]     Patient Gender: M Patient Age:   23Y Exam Location:  [facility] Procedure:      VAS US DOPPLER PRE CABG Referring Phys: [id number] BRYAN K BARTLE --------------------------------------------------------------------------------  Indications:      Pre-CABG. Risk Factors:     Hypertension, hyperlipidemia, Diabetes, coronary artery                   disease, prior CVA. Comparison Study: No prior study on file Performing Technologist: Sherren Kerns RVS  Examination Guidelines: A complete evaluation includes B-mode imaging, spectral Doppler, color Doppler, and power Doppler as needed of all accessible portions of each vessel. Bilateral testing is considered an integral part of a complete examination. Limited examinations for reoccurring indications may be performed as noted.  Right Carotid Findings: +----------+--------+--------+--------+------------+--------+           PSV cm/sEDV cm/sStenosisDescribe    Comments +----------+--------+--------+--------+------------+--------+ CCA Prox  111     27              heterogenous         +----------+--------+--------+--------+------------+--------+ CCA Distal67      20              heterogenous         +----------+--------+--------+--------+------------+--------+ ICA Prox  240     64      60-79%  calcific             +----------+--------+--------+--------+------------+--------+ ICA Mid   158     42                                   +----------+--------+--------+--------+------------+--------+ ICA Distal163     54                                   +----------+--------+--------+--------+------------+--------+ ECA       220     56                                   +----------+--------+--------+--------+------------+--------+ Portions of this table do not appear on this page. +----------+--------+-------+--------+------------+           PSV cm/sEDV cmsDescribeArm  Pressure +----------+--------+-------+--------+------------+ Subclavian109                                 +----------+--------+-------+--------+------------+ +---------+--------+---+--------+--+  VertebralPSV cm/s149EDV cm/s38 +---------+--------+---+--------+--+ Left Carotid Findings: +----------+--------+--------+--------+--------+------------------+           PSV cm/sEDV cm/sStenosisDescribeComments           +----------+--------+--------+--------+--------+------------------+ CCA Prox  113     16                      intimal thickening +----------+--------+--------+--------+--------+------------------+ CCA Distal55      16                      intimal thickening +----------+--------+--------+--------+--------+------------------+ ICA Prox  480     151     80-99%  calcificShadowing          +----------+--------+--------+--------+--------+------------------+ ICA Mid   402     119                                        +----------+--------+--------+--------+--------+------------------+ ICA Distal122     27                                         +----------+--------+--------+--------+--------+------------------+ ECA       191     27                                         +----------+--------+--------+--------+--------+------------------+  +----------+--------+--------+--------+------------+ SubclavianPSV cm/sEDV cm/sDescribeArm Pressure +----------+--------+--------+--------+------------+           141                                  +----------+--------+--------+--------+------------+ +---------+--------+---+--------+--+ VertebralPSV cm/s150EDV cm/s15 +---------+--------+---+--------+--+  ABI Findings: +--------+------------------+-----+-----------+---------------+ Right   Rt Pressure (mmHg)IndexWaveform   Comment         +--------+------------------+-----+-----------+---------------+ Brachial                        multiphasicGlucose monitor +--------+------------------+-----+-----------+---------------+ PTA     158               0.94 multiphasic                +--------+------------------+-----+-----------+---------------+ DP      164               0.98 multiphasic                +--------+------------------+-----+-----------+---------------+ +--------+------------------+-----+-----------+-------+ Left    Lt Pressure (mmHg)IndexWaveform   Comment +--------+------------------+-----+-----------+-------+ [id number]                    multiphasic        +--------+------------------+-----+-----------+-------+ PTA     170               1.01 multiphasic        +--------+------------------+-----+-----------+-------+ DP      167               0.99 multiphasic        +--------+------------------+-----+-----------+-------+ +-------+---------------+----------------+ ABI/TBIToday's ABI/TBIPrevious ABI/TBI +-------+---------------+----------------+ Right  0.98                            +-------+---------------+----------------+ Left   1.01                            +-------+---------------+----------------+  Right Doppler Findings: +--------+--------+-----+-----------+------------------------------------------+ Site    PressureIndexDoppler    Comments                                   +--------+--------+-----+-----------+------------------------------------------+ Brachial             multiphasicGlucose monitor                            +--------+--------+-----+-----------+------------------------------------------+ Radial               multiphasic                                           +--------+--------+-----+-----------+------------------------------------------+ Ulnar                multiphasic                                           +--------+--------+-----+-----------+------------------------------------------+ Digit                            Doppler signal remains normal with radial                                  compression and reverses with ulnar                                        compression                                +--------+--------+-----+-----------+------------------------------------------+  Left Doppler Findings: +-----------+--------+-----+-----------+---------------------------------------+ Site       PressureIndexDoppler    Comments                                +-----------+--------+-----+-----------+---------------------------------------+ Brachial   168          multiphasic                                        +-----------+--------+-----+-----------+---------------------------------------+ Radial                  multiphasic                                        +-----------+--------+-----+-----------+---------------------------------------+ Ulnar                   multiphasic                                        +-----------+--------+-----+-----------+---------------------------------------+ Palmar Arch  Doppler signal remains normal with                                         radial compression and reverses with                                       ulnar compression                       +-----------+--------+-----+-----------+---------------------------------------+  Summary: Right Carotid: Velocities in the right ICA are consistent with a 60-79%                stenosis. Left Carotid: Velocities in the left ICA are consistent with a 80-99% stenosis. Vertebrals:  Bilateral vertebral arteries demonstrate antegrade flow. Subclavians: Normal flow hemodynamics were seen in bilateral subclavian              arteries. Right ABI: Resting right ankle-brachial index is within normal range. No evidence of significant right lower extremity arterial disease. Left ABI: Resting left ankle-brachial index is within normal range. No evidence of significant  left lower extremity arterial disease. Right Upper Extremity: Doppler waveforms remain within normal limits with right radial compression. Doppler waveforms remain within normal limits with right ulnar compression. Left Upper Extremity: Doppler waveforms remain within normal limits with left radial compression. Doppler waveforms remain within normal limits with left ulnar compression.     Preliminary

## 2020-12-18 NOTE — Progress Notes (Signed)
ANTICOAGULATION CONSULT NOTE - Follow-Up Consult  Pharmacy Consult for heparin Indication: chest pain/ACS  Patient Measurements: Height: 6' (182.9 cm) Weight: 136 kg (299 lb 12.8 oz) IBW/kg (Calculated) : 77.6 Heparin Dosing Weight: 108.7kg  Vital Signs: Temp: 98.8 F (37.1 C) (04/29 2100) Temp Source: Oral (04/29 2100) BP: 144/59 (04/29 2100) Pulse Rate: 64 (04/29 2100)  Labs: Recent Labs    12/16/20 0833 12/16/20 1054 12/17/20 0739 12/17/20 1610 12/18/20 0024  HGB 14.5  --  14.5  --  13.5  HCT 44.8  --  44.0  --  41.4  PLT 299  --  281  --  244  HEPARINUNFRC  --   --  <0.10* 0.14* 0.14*  CREATININE 0.87  --  0.89  --   --   TROPONINIHS 32* 893*  --   --   --     Estimated Creatinine Clearance: 134 mL/min (by C-G formula based on SCr of 0.89 mg/dL).   Assessment: 77 YOM who presented with CP/NSTEMI and is now s/p cath which showed complex distal main disease - consulting CVTS. Now planned for surgery on Mon, 5/2.  Heparin level this evening remains SUBtherapeutic (0.14) on gtt at 1950 units/hr - noted this hour is only ~4 hours post gtt rate change. No issues with line or bleeding reported per RN.  Goal of Therapy:  Heparin level 0.3-0.7 units/ml Monitor platelets by anticoagulation protocol: Yes   Plan:  Increase heparin infusion to 2200 units/hr Will f/u 6 hr heparin level  Christoper Fabian, PharmD, BCPS Please see amion for complete clinical pharmacist phone list 12/18/2020 1:08 AM

## 2020-12-18 NOTE — Progress Notes (Signed)
ANTICOAGULATION CONSULT NOTE - Follow-Up Consult  Pharmacy Consult for heparin Indication: chest pain/ACS  Patient Measurements: Height: 6' (182.9 cm) Weight: 135.3 kg (298 lb 3.2 oz) IBW/kg (Calculated) : 77.6 Heparin Dosing Weight: 108.7kg  Vital Signs: Temp: 97.6 F (36.4 C) (04/30 0724) Temp Source: Oral (04/30 0724) BP: 142/37 (04/30 0724) Pulse Rate: 61 (04/30 0724)  Labs: Recent Labs    12/16/20 0833 12/16/20 0833 12/16/20 1054 12/17/20 0739 12/17/20 1610 12/18/20 0024 12/18/20 0749  HGB 14.5  --   --  14.5  --  13.5  --   HCT 44.8  --   --  44.0  --  41.4  --   PLT 299  --   --  281  --  244  --   HEPARINUNFRC  --    < >  --  <0.10* 0.14* 0.14* 0.39  CREATININE 0.87  --   --  0.89  --   --   --   TROPONINIHS 32*  --  893*  --   --   --   --    < > = values in this interval not displayed.    Estimated Creatinine Clearance: 133.6 mL/min (by C-G formula based on SCr of 0.89 mg/dL).   Assessment: 23 YOM who presented with CP/NSTEMI and is now s/p cath which showed complex distal main disease - consulting CVTS. Now planned for surgery on Mon, 5/2.  Heparin level now therapeutic at 0.39 after rate increase to 2200 units/hr. CBC stable. No issues with line or bleeding per RN.  Goal of Therapy:  Heparin level 0.3-0.7 units/ml Monitor platelets by anticoagulation protocol: Yes   Plan:  Continue IV heparin 2200 units/hr Monitor daily heparin level, CBC, s/s bleeding Plans for CABG Monday 5/2  Pervis Hocking, PharmD PGY1 Pharmacy Resident 12/18/2020 8:46 AM  Please check [email or web address] for unit-specific pharmacy phone numbers.

## 2020-12-18 NOTE — Progress Notes (Signed)
VASCULAR LAB    Pre CABG Dopplers have been performed.  See CV proc for preliminary results.  Called report to Gershon Crane, PA-C  Neeka Urista, [facility], RVT 12/18/2020, 9:41 AM
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
## 2020-12-18 NOTE — Progress Notes (Signed)
Pt advised his Freestyle Libre sensor showed a CBG of 45. Pt given graham crackers and juice

## 2020-12-18 NOTE — Progress Notes (Signed)
Progress Note  Patient Name: Timothy Golden Date of Encounter: 12/18/2020  Primary Cardiologist: Dr. Eldridge Dace, MD   Subjective   Currently feeling well.  No chest pain or shortness of breath.  Anxious for surgery on Monday.  Inpatient Medications    Scheduled Meds: . aspirin EC  81 mg Oral Daily  . atorvastatin  80 mg Oral Daily  . [START ON 12/20/2020] epinephrine  0-10 mcg/min Intravenous To OR  . [START ON 12/20/2020] heparin-papaverine-plasmalyte irrigation   Irrigation To OR  . insulin aspart  0-20 Units Subcutaneous TID WC  . insulin aspart  0-30 Units Subcutaneous QHS  . insulin aspart  20 Units Subcutaneous TID WC  . insulin glargine  25 Units Subcutaneous BID  . [START ON 12/20/2020] insulin   Intravenous To OR  . losartan  50 mg Oral Daily  . [START ON 12/20/2020] magnesium sulfate  40 mEq Other To OR  . [START ON 12/20/2020] phenylephrine  30-200 mcg/min Intravenous To OR  . [START ON 12/20/2020] potassium chloride  80 mEq Other To OR  . [START ON 12/20/2020] tranexamic acid  15 mg/kg Intravenous To OR  . [START ON 12/20/2020] tranexamic acid  2 mg/kg Intracatheter To OR   Continuous Infusions: . sodium chloride 10 mL/hr at 12/17/20 2224  . [START ON 12/20/2020] cefUROXime (ZINACEF)  IV    . [START ON 12/20/2020] cefUROXime (ZINACEF)  IV    . [START ON 12/20/2020] dexmedetomidine    . [START ON 12/20/2020] heparin 30,000 units/NS 1000 mL solution for CELLSAVER    . heparin 2,200 Units/hr (12/18/20 0340)  . [START ON 12/20/2020] milrinone    . [START ON 12/20/2020] nitroGLYCERIN    . [START ON 12/20/2020] norepinephrine    . [START ON 12/20/2020] tranexamic acid (CYKLOKAPRON) infusion ([facility])    . [START ON 12/20/2020] tranexamic acid (CYKLOKAPRON) infusion ([facility])    . [START ON 12/20/2020] vancomycin     PRN Meds: sodium chloride, acetaminophen, nitroGLYCERIN, ondansetron (ZOFRAN) IV   Vital Signs    Vitals:   12/17/20 1628 12/17/20 2100 12/18/20 0632 12/18/20 0724  BP: (!) 143/54 (!)  144/59 (!) 144/60 (!) 142/37  Pulse: 62 64 61 61  Resp: 18 18 18 18   Temp: 97.9 F (36.6 C) 98.8 F (37.1 C) 98.6 F (37 C) 97.6 F (36.4 C)  TempSrc: Oral Oral Oral Oral  SpO2: 93% 95%  94%  Weight:   135.3 kg   Height:        Intake/Output Summary (Last 24 hours) at 12/18/2020 1010 Last data filed at 12/18/2020 0835 Gross per 24 hour  Intake 499.32 ml  Output --  Net 499.32 ml   Filed Weights   12/16/20 0814 12/17/20 0500 12/18/20 12/20/20  Weight: 136.1 kg 136 kg 135.3 kg    Physical Exam   GEN: Well nourished, well developed, in no acute distress  HEENT: normal  Neck: no JVD, carotid bruits, or masses Cardiac: RRR; no murmurs, rubs, or gallops,no edema  Respiratory:  clear to auscultation bilaterally, normal work of breathing GI: soft, nontender, nondistended, + BS MS: no deformity or atrophy  Skin: warm and dry Neuro:  Strength and sensation are intact Psych: euthymic mood, full affect   Labs    Chemistry Recent Labs  Lab 12/16/20 0833 12/17/20 0739  NA 138 136  K 3.9 4.0  CL 104 102  CO2 21* 25  GLUCOSE 249* 267*  BUN 28* 17  CREATININE 0.87 0.89  CALCIUM 9.9 9.1  GFRNONAA >60 >60  ANIONGAP 13 9     Hematology Recent Labs  Lab 12/16/20 0833 12/17/20 0739 12/18/20 0024  WBC 11.3* 9.5 9.0  RBC 5.22 5.17 4.84  HGB 14.5 14.5 13.5  HCT 44.8 44.0 41.4  MCV 85.8 85.1 85.5  MCH 27.8 28.0 27.9  MCHC 32.4 33.0 32.6  RDW 13.6 13.5 13.6  PLT 299 281 244    Cardiac EnzymesNo results for input(s): TROPONINI in the last 168 hours. No results for input(s): TROPIPOC in the last 168 hours.   BNPNo results for input(s): BNP, PROBNP in the last 168 hours.   DDimer No results for input(s): DDIMER in the last 168 hours.   Radiology    CARDIAC CATHETERIZATION  Result Date: 12/16/2020  Ost LAD to Prox LAD lesion is 100% stenosed. Right to left collaterals are present.  Dist LM lesion is 60% stenosed.  Ramus lesion is 75% stenosed.  Ost Cx lesion is  60% stenosed.  The left ventricular ejection fraction is 35-45% by visual estimate.  There is mild to moderate left ventricular systolic dysfunction.  LV end diastolic pressure is mildly elevated.  There is no aortic valve stenosis.  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  Patient is hemodynamically stable without chest pain. Restart IV heparin in 8 hours.  Can add IV NTG if he has any discomfort or hypertension.   I discussed the findings with the patient and his wife.   ECHOCARDIOGRAM COMPLETE  Result Date: 12/17/2020    ECHOCARDIOGRAM REPORT   Patient Name:   Timothy Golden Date of Exam: 12/17/2020 Medical Rec #:  [id number]    Height:       72.0 in Accession #:    [id number]   Weight:       299.8 lb Date of Birth:  [birth year]    BSA:          2.531 m Patient Age:    55 years     BP:           134/52 mmHg Patient Gender: M            HR:           49 bpm. Exam Location:  Inpatient Procedure: 2D Echo, Color Doppler and Cardiac Doppler Indications:    NSTEMI I21.4  History:        Patient has no prior history of Echocardiogram examinations.                 Stroke; Risk Factors:Hypertension, Diabetes and Dyslipidemia.  Sonographer:    Eulah Pont RDCS Referring Phys: [id number] JILL D MCDANIEL IMPRESSIONS  1. Left ventricular ejection fraction, by estimation, is 60 to 65%. The left ventricle has normal function. The left ventricle has no regional wall motion abnormalities. Left ventricular diastolic parameters are consistent with Grade II diastolic dysfunction (pseudonormalization). Elevated left ventricular end-diastolic pressure.  2. Right ventricular systolic function is normal. The right ventricular size is normal. There is normal pulmonary artery systolic pressure.  3. Left atrial size was moderately dilated.  4. The mitral valve is normal in structure. Trivial mitral valve regurgitation. No evidence of mitral stenosis.  5. The aortic valve is tricuspid. Aortic valve regurgitation is  not visualized. No aortic stenosis is present.  6. Aortic dilatation noted. There is mild dilatation of the ascending aorta, measuring 37 mm.  7. The inferior vena cava is normal in size with greater than 50% respiratory variability, suggesting right atrial pressure of 3  mmHg. FINDINGS  Left Ventricle: Left ventricular ejection fraction, by estimation, is 60 to 65%. The left ventricle has normal function. The left ventricle has no regional wall motion abnormalities. The left ventricular internal cavity size was normal in size. There is  no left ventricular hypertrophy. Left ventricular diastolic parameters are consistent with Grade II diastolic dysfunction (pseudonormalization). Elevated left ventricular end-diastolic pressure. Right Ventricle: The right ventricular size is normal. No increase in right ventricular wall thickness. Right ventricular systolic function is normal. There is normal pulmonary artery systolic pressure. The tricuspid regurgitant velocity is 1.42 m/s, and  with an assumed right atrial pressure of 3 mmHg, the estimated right ventricular systolic pressure is 11.1 mmHg. Left Atrium: Left atrial size was moderately dilated. Right Atrium: Right atrial size was normal in size. Pericardium: There is no evidence of pericardial effusion. Mitral Valve: The mitral valve is normal in structure. Trivial mitral valve regurgitation. No evidence of mitral valve stenosis. Tricuspid Valve: The tricuspid valve is normal in structure. Tricuspid valve regurgitation is trivial. No evidence of tricuspid stenosis. Aortic Valve: The aortic valve is tricuspid. Aortic valve regurgitation is not visualized. No aortic stenosis is present. Pulmonic Valve: The pulmonic valve was normal in structure. Pulmonic valve regurgitation is not visualized. No evidence of pulmonic stenosis. Aorta: Aortic dilatation noted. There is mild dilatation of the ascending aorta, measuring 37 mm. Venous: The inferior vena cava is normal in size  with greater than 50% respiratory variability, suggesting right atrial pressure of 3 mmHg. IAS/Shunts: No atrial level shunt detected by color flow Doppler.  LEFT VENTRICLE PLAX 2D LVIDd:         5.20 cm  Diastology LVIDs:         3.20 cm  LV e' medial:    4.87 cm/s LV PW:         0.90 cm  LV E/e' medial:  16.9 LV IVS:        0.80 cm  LV e' lateral:   11.90 cm/s LVOT diam:     2.10 cm  LV E/e' lateral: 6.9 LV SV:         81 LV SV Index:   32 LVOT Area:     3.46 cm  RIGHT VENTRICLE RV S prime:     10.60 cm/s TAPSE (M-mode): 2.0 cm LEFT ATRIUM             Index       RIGHT ATRIUM           Index LA diam:        3.60 cm 1.42 cm/m  RA Area:     12.20 cm LA Vol (A2C):   45.2 ml 17.86 ml/m RA Volume:   19.90 ml  7.86 ml/m LA Vol (A4C):   88.8 ml 35.09 ml/m LA Biplane Vol: 70.7 ml 27.94 ml/m  AORTIC VALVE LVOT Vmax:   90.60 cm/s LVOT Vmean:  59.800 cm/s LVOT VTI:    0.235 m  AORTA Ao Root diam: 3.40 cm Ao Asc diam:  3.70 cm MITRAL VALVE               TRICUSPID VALVE MV Area (PHT): 2.08 cm    TR Peak grad:   8.1 mmHg MV Decel Time: 364 msec    TR Vmax:        142.00 cm/s MV E velocity: 82.50 cm/s MV A velocity: 34.90 cm/s  SHUNTS MV E/A ratio:  2.36        Systemic VTI:  0.24 m                            Systemic Diam: 2.10 cm Chilton Si MD Electronically signed by Chilton Si MD Signature Date/Time: 12/17/2020/11:46:42 AM    Final    VAS US DOPPLER PRE CABG  Result Date: 12/18/2020 PREOPERATIVE VASCULAR EVALUATION Patient Name:  HUNTLEY KNOOP  Date of Exam:   12/18/2020 Medical Rec #: [id number]     Accession #:    [id number] Date of Birth: May 16, 1966     Patient Gender: M Patient Age:   83Y Exam Location:  [facility] Procedure:      VAS US DOPPLER PRE CABG Referring Phys: [id number] BRYAN K BARTLE --------------------------------------------------------------------------------  Indications:      Pre-CABG. Risk Factors:     Hypertension, hyperlipidemia, Diabetes, coronary artery                    disease, prior CVA. Comparison Study: No prior study on file Performing Technologist: Sherren Kerns RVS  Examination Guidelines: A complete evaluation includes B-mode imaging, spectral Doppler, color Doppler, and power Doppler as needed of all accessible portions of each vessel. Bilateral testing is considered an integral part of a complete examination. Limited examinations for reoccurring indications may be performed as noted.  Right Carotid Findings: +----------+--------+--------+--------+------------+--------+           PSV cm/sEDV cm/sStenosisDescribe    Comments +----------+--------+--------+--------+------------+--------+ CCA Prox  111     27              heterogenous         +----------+--------+--------+--------+------------+--------+ CCA Distal67      20              heterogenous         +----------+--------+--------+--------+------------+--------+ ICA Prox  240     64      60-79%  calcific             +----------+--------+--------+--------+------------+--------+ ICA Mid   158     42                                   +----------+--------+--------+--------+------------+--------+ ICA Distal163     54                                   +----------+--------+--------+--------+------------+--------+ ECA       220     56                                   +----------+--------+--------+--------+------------+--------+ Portions of this table do not appear on this page. +----------+--------+-------+--------+------------+           PSV cm/sEDV cmsDescribeArm Pressure +----------+--------+-------+--------+------------+ Subclavian109                                 +----------+--------+-------+--------+------------+ +---------+--------+---+--------+--+ VertebralPSV cm/s149EDV cm/s38 +---------+--------+---+--------+--+ Left Carotid Findings: +----------+--------+--------+--------+--------+------------------+           PSV cm/sEDV  cm/sStenosisDescribeComments           +----------+--------+--------+--------+--------+------------------+ CCA Prox  113     16  intimal thickening +----------+--------+--------+--------+--------+------------------+ CCA Distal55      16                      intimal thickening +----------+--------+--------+--------+--------+------------------+ ICA Prox  480     151     80-99%  calcificShadowing          +----------+--------+--------+--------+--------+------------------+ ICA Mid   402     119                                        +----------+--------+--------+--------+--------+------------------+ ICA Distal122     27                                         +----------+--------+--------+--------+--------+------------------+ ECA       191     27                                         +----------+--------+--------+--------+--------+------------------+  +----------+--------+--------+--------+------------+ SubclavianPSV cm/sEDV cm/sDescribeArm Pressure +----------+--------+--------+--------+------------+           141                                  +----------+--------+--------+--------+------------+ +---------+--------+---+--------+--+ VertebralPSV cm/s150EDV cm/s15 +---------+--------+---+--------+--+  ABI Findings: +--------+------------------+-----+-----------+---------------+ Right   Rt Pressure (mmHg)IndexWaveform   Comment         +--------+------------------+-----+-----------+---------------+ Brachial                       multiphasicGlucose monitor +--------+------------------+-----+-----------+---------------+ PTA     158               0.94 multiphasic                +--------+------------------+-----+-----------+---------------+ DP      164               0.98 multiphasic                +--------+------------------+-----+-----------+---------------+  +--------+------------------+-----+-----------+-------+ Left    Lt Pressure (mmHg)IndexWaveform   Comment +--------+------------------+-----+-----------+-------+ [id number]                    multiphasic        +--------+------------------+-----+-----------+-------+ PTA     170               1.01 multiphasic        +--------+------------------+-----+-----------+-------+ DP      167               0.99 multiphasic        +--------+------------------+-----+-----------+-------+ +-------+---------------+----------------+ ABI/TBIToday's ABI/TBIPrevious ABI/TBI +-------+---------------+----------------+ Right  0.98                            +-------+---------------+----------------+ Left   1.01                            +-------+---------------+----------------+  Right Doppler Findings: +--------+--------+-----+-----------+------------------------------------------+ Site    PressureIndexDoppler    Comments                                   +--------+--------+-----+-----------+------------------------------------------+  Brachial             multiphasicGlucose monitor                            +--------+--------+-----+-----------+------------------------------------------+ Radial               multiphasic                                           +--------+--------+-----+-----------+------------------------------------------+ Ulnar                multiphasic                                           +--------+--------+-----+-----------+------------------------------------------+ Digit                           Doppler signal remains normal with radial                                  compression and reverses with ulnar                                        compression                                +--------+--------+-----+-----------+------------------------------------------+  Left Doppler Findings:  +-----------+--------+-----+-----------+---------------------------------------+ Site       PressureIndexDoppler    Comments                                +-----------+--------+-----+-----------+---------------------------------------+ Brachial   168          multiphasic                                        +-----------+--------+-----+-----------+---------------------------------------+ Radial                  multiphasic                                        +-----------+--------+-----+-----------+---------------------------------------+ Ulnar                   multiphasic                                        +-----------+--------+-----+-----------+---------------------------------------+ Palmar Arch                        Doppler signal remains normal with                                         radial compression  and reverses with                                       ulnar compression                       +-----------+--------+-----+-----------+---------------------------------------+  Summary: Right Carotid: Velocities in the right ICA are consistent with a 60-79%                stenosis. Left Carotid: Velocities in the left ICA are consistent with a 80-99% stenosis. Vertebrals:  Bilateral vertebral arteries demonstrate antegrade flow. Subclavians: Normal flow hemodynamics were seen in bilateral subclavian              arteries. Right ABI: Resting right ankle-brachial index is within normal range. No evidence of significant right lower extremity arterial disease. Left ABI: Resting left ankle-brachial index is within normal range. No evidence of significant left lower extremity arterial disease. Right Upper Extremity: Doppler waveforms remain within normal limits with right radial compression. Doppler waveforms remain within normal limits with right ulnar compression. Left Upper Extremity: Doppler waveforms remain within normal limits with left radial compression.  Doppler waveforms remain within normal limits with left ulnar compression.     Preliminary    Telemetry    Sinus rhythm-personally reviewed  ECG    None new  Cardiac Studies   LHC 12/16/20:   Ost LAD to Prox LAD lesion is 100% stenosed. Right to left collaterals are present.  Dist LM lesion is 60% stenosed.  Ramus lesion is 75% stenosed.  Ost Cx lesion is 60% stenosed.  The left ventricular ejection fraction is 35-45% by visual estimate.  There is mild to moderate left ventricular systolic dysfunction.  LV end diastolic pressure is mildly elevated.  There is no aortic valve stenosis.   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  Patient is hemodynamically stable without chest pain.  Restart IV heparin in 8 hours.    Can add IV NTG if he has any discomfort or hypertension.    Diagnostic Dominance: Co-dominant     Patient Profile     55 y.o. male with a hx of DM1, HTN, HLD, and morbid obesity with a BMI > 40 who presented to [facility] 12/16/20 with chest pain.  Assessment & Plan    1.  Non-STEMI: Presented to the hospital with chest pain, found to have troponin elevation to 893.  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  Plan for CABG on Monday.  Remains chest pain-free.  -Denies chest pain   2.  Type 1 diabetes: Follows closely with outpatient endocrinology.  Have consulted internal medicine for assistance with blood sugars.  3.  Hypertension:: Currently stable.  Have restarted home medications.  4.  Hyperlipidemia:: Continue atorvastatin.  Goal LDL less than 70.  5.  Morbid obesity: BMI greater than 40.  Garnette Greb need diet and exercise as an outpatient.  Signed, Loman BrooklynWill Jayro Mcmath, MD [facility] 12/18/2020, 10:10 AM     For questions or updates, please contact   Please consult [email or web address] for contact info under Cardiology/STEMI.

## 2020-12-18 NOTE — Progress Notes (Addendum)
Patient complained of MSCP 1/10 for 5 minutes has since resolved BP at 1240 157/60. Described as pressure "weight on my chest", does not radiate. Pressure returned at 1250. Judy Pimple PA notified of chest pain and instructed to give sl nitro. No need for ECG at this time. Given Nitro 0.4 mg SL at 1251. Pressure resolved at 1254. Patient resting quietly 1300 136/57. Resting quietly and instructed to call nurse for any recurring pressure, pain or shortness of breath.

## 2020-12-19 DIAGNOSIS — I214 Non-ST elevation (NSTEMI) myocardial infarction: Secondary | ICD-10-CM | POA: Diagnosis not present

## 2020-12-19 LAB — GLUCOSE, CAPILLARY
Glucose-Capillary: 146 mg/dL — ABNORMAL HIGH (ref 70–99)
Glucose-Capillary: 135 mg/dL — ABNORMAL HIGH (ref 70–99)
Glucose-Capillary: 180 mg/dL — ABNORMAL HIGH (ref 70–99)
Glucose-Capillary: 207 mg/dL — ABNORMAL HIGH (ref 70–99)
Glucose-Capillary: 282 mg/dL — ABNORMAL HIGH (ref 70–99)

## 2020-12-19 LAB — URINALYSIS, ROUTINE W REFLEX MICROSCOPIC
Bilirubin Urine: NEGATIVE
Glucose, UA: 50 mg/dL — AB
Hgb urine dipstick: NEGATIVE
Ketones, ur: 5 mg/dL — AB
Leukocytes,Ua: NEGATIVE
Nitrite: NEGATIVE
Protein, ur: NEGATIVE mg/dL
Specific Gravity, Urine: 1.019 (ref 1.005–1.030)
pH: 6 (ref 5.0–8.0)

## 2020-12-19 LAB — TYPE AND SCREEN
ABO/RH(D): A POS
Antibody Screen: NEGATIVE

## 2020-12-19 LAB — CBC
HCT: 41.1 % (ref 39.0–52.0)
Hemoglobin: 13.4 g/dL (ref 13.0–17.0)
MCH: 27.6 pg (ref 26.0–34.0)
MCHC: 32.6 g/dL (ref 30.0–36.0)
MCV: 84.6 fL (ref 80.0–100.0)
Platelets: 246 K/uL (ref 150–400)
RBC: 4.86 MIL/uL (ref 4.22–5.81)
RDW: 13.6 % (ref 11.5–15.5)
WBC: 9.3 K/uL (ref 4.0–10.5)
nRBC: 0 % (ref 0.0–0.2)

## 2020-12-19 LAB — ABO/RH: ABO/RH(D): A POS

## 2020-12-19 LAB — HEPARIN LEVEL (UNFRACTIONATED): Heparin Unfractionated: 0.33 IU/mL (ref 0.30–0.70)

## 2020-12-19 LAB — SURGICAL PCR SCREEN
MRSA, PCR: POSITIVE — AB
Staphylococcus aureus: POSITIVE — AB

## 2020-12-19 MED ORDER — DIAZEPAM 5 MG PO TABS
5.0000 mg | ORAL_TABLET | Freq: Once | ORAL | Status: AC
  Administered 2020-12-20: 5 mg via ORAL
  Filled 2020-12-19: qty 1

## 2020-12-19 MED ORDER — MUPIROCIN 2 % EX OINT
TOPICAL_OINTMENT | Freq: Two times a day (BID) | CUTANEOUS | Status: DC
  Filled 2020-12-19: qty 22

## 2020-12-19 MED ORDER — INSULIN GLARGINE 100 UNIT/ML ~~LOC~~ SOLN
30.0000 [IU] | Freq: Once | SUBCUTANEOUS | Status: AC
  Administered 2020-12-19: 30 [IU] via SUBCUTANEOUS
  Filled 2020-12-19: qty 0.3

## 2020-12-19 MED ORDER — INSULIN REGULAR(HUMAN) IN NACL 100-0.9 UT/100ML-% IV SOLN
INTRAVENOUS | Status: DC
  Administered 2020-12-20: 10.5 [IU]/h via INTRAVENOUS
  Filled 2020-12-19: qty 100

## 2020-12-19 MED ORDER — METOPROLOL TARTRATE 12.5 MG HALF TABLET
12.5000 mg | ORAL_TABLET | Freq: Once | ORAL | Status: DC
  Filled 2020-12-19: qty 1

## 2020-12-19 MED ORDER — CHLORHEXIDINE GLUCONATE CLOTH 2 % EX PADS
6.0000 | MEDICATED_PAD | Freq: Once | CUTANEOUS | Status: AC
  Administered 2020-12-19: 6 via TOPICAL

## 2020-12-19 MED ORDER — INSULIN GLARGINE 100 UNIT/ML ~~LOC~~ SOLN: 25.0000 [IU] | Freq: Two times a day (BID) | SUBCUTANEOUS | Status: DC

## 2020-12-19 MED ORDER — NITROGLYCERIN IN D5W 200-5 MCG/ML-% IV SOLN
2.0000 ug/min | INTRAVENOUS | Status: AC
  Administered 2020-12-19: 10 ug/min via INTRAVENOUS
  Filled 2020-12-19: qty 250

## 2020-12-19 MED ORDER — NITROGLYCERIN IN D5W 200-5 MCG/ML-% IV SOLN
0.0000 ug/min | INTRAVENOUS | Status: DC
  Administered 2020-12-19: 10 ug/min via INTRAVENOUS

## 2020-12-19 MED ORDER — TEMAZEPAM 15 MG PO CAPS: 15.0000 mg | ORAL_CAPSULE | Freq: Once | ORAL | Status: DC | PRN

## 2020-12-19 MED ORDER — INSULIN ASPART 100 UNIT/ML IJ SOLN
3.0000 [IU] | Freq: Once | INTRAMUSCULAR | Status: AC
  Administered 2020-12-19: 3 [IU] via SUBCUTANEOUS

## 2020-12-19 MED ORDER — INSULIN ASPART 100 UNIT/ML IJ SOLN: 0.0000 [IU] | Freq: Three times a day (TID) | INTRAMUSCULAR | Status: DC

## 2020-12-19 MED ORDER — NITROGLYCERIN IN D5W 200-5 MCG/ML-% IV SOLN: 2.0000 ug/min | INTRAVENOUS | Status: DC

## 2020-12-19 MED ORDER — DEXTROSE 50 % IV SOLN: 0.0000 mL | INTRAVENOUS | Status: DC | PRN

## 2020-12-19 MED ORDER — CHLORHEXIDINE GLUCONATE CLOTH 2 % EX PADS
6.0000 | MEDICATED_PAD | Freq: Once | CUTANEOUS | Status: AC
  Administered 2020-12-20: 6 via TOPICAL

## 2020-12-19 MED ORDER — CHLORHEXIDINE GLUCONATE 0.12 % MT SOLN
15.0000 mL | Freq: Once | OROMUCOSAL | Status: AC
Start: 2020-12-20 — End: 2020-12-20
  Administered 2020-12-20: 15 mL via OROMUCOSAL
  Filled 2020-12-19: qty 15

## 2020-12-19 MED ORDER — HYDROXYZINE HCL 25 MG PO TABS
25.0000 mg | ORAL_TABLET | Freq: Three times a day (TID) | ORAL | Status: DC | PRN
  Administered 2020-12-19: 25 mg via ORAL
  Filled 2020-12-19: qty 1

## 2020-12-19 MED ORDER — BISACODYL 5 MG PO TBEC: 5.0000 mg | DELAYED_RELEASE_TABLET | Freq: Once | ORAL | Status: DC

## 2020-12-19 NOTE — Plan of Care (Signed)
  Problem: Cardiovascular: Goal: Ability to achieve and maintain adequate cardiovascular perfusion will improve Outcome: Progressing Goal: Vascular access site(s) Level 0-1 will be maintained Outcome: Progressing   

## 2020-12-19 NOTE — Progress Notes (Signed)
    Subjective  -   No acute overnight events Denies any neurologic changes   Physical Exam:  Neurologically intact CV: Regular rate and rhythm Abdomen soft  CTA neck: 1. 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 2. 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 Additional severe near occlusive stenosis at the origin of the left external carotid artery. 3. 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 Dominant right vertebral artery occludes just distal to its origin, with irregular distal reconstitution at the proximal V2 segment. Additional moderate to severe multifocal stenoses throughout the right V2 through V4 segments. 4. 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 PCAs not visualized on this exam.    Assessment/Plan:    Bilateral carotid stenosis, left greater than right in the setting of severe coronary disease.  CT angiogram yesterday indicates a 70% left carotid stenosis and 60% right stenosis.  Since the patient is asymptomatic from a carotid perspective, and his stenosis is less than 80% by CT scan, I would recommend proceeding with CABG without carotid intervention.  This was discussed with the patient.  I will schedule him for follow-up in the office with a repeat carotid duplex in 6 months.  Timothy Golden 12/19/2020 10:18 AM --  Vitals:   12/19/20 0449 12/19/20 0854  BP: 135/65   Pulse:  66  Resp:  18  Temp:  97.9 F (36.6 C)  SpO2:  97%    Intake/Output Summary (Last 24 hours) at 12/19/2020 1018 Last data filed at 12/19/2020 0300 Gross per 24 hour  Intake 1004.41 ml  Output --  Net 1004.41 ml     Laboratory CBC    Component Value Date/Time   WBC 9.3 12/19/2020 0145   HGB 13.4 12/19/2020 0145   HCT 41.1 12/19/2020 0145   PLT 246 12/19/2020 0145     BMET    Component Value Date/Time   NA 136 12/17/2020 0739   K 4.0 12/17/2020 0739   CL 102 12/17/2020 0739   CO2 25 12/17/2020 0739   GLUCOSE 267 (H) 12/17/2020 0739   BUN 17 12/17/2020 0739   CREATININE 0.89 12/17/2020 0739   CALCIUM 9.1 12/17/2020 0739   GFRNONAA >60 12/17/2020 0739    COAG No results found for: INR, PROTIME No results found for: PTT  Antibiotics Anti-infectives (From admission, onward)   Start     Dose/Rate Route Frequency Ordered Stop   12/20/20 0400  vancomycin (VANCOREADY) IVPB 1500 mg/300 mL        1,500 mg 150 mL/hr over 120 Minutes Intravenous To Surgery 12/17/20 1248 12/21/20 0400   12/20/20 0400  cefUROXime (ZINACEF) 1.5 g in sodium chloride 0.9 % 100 mL IVPB        1.5 g 200 mL/hr over 30 Minutes Intravenous To Surgery 12/17/20 1248 12/21/20 0400   12/20/20 0400  cefUROXime (ZINACEF) 750 mg in sodium chloride 0.9 % 100 mL IVPB        750 mg 200 mL/hr over 30 Minutes Intravenous To Surgery 12/17/20 1248 12/21/20 0400       V. Charlena Cross, M.D., [facility] [place] Office: [phone] Pager:  [phone]

## 2020-12-19 NOTE — Anesthesia Preprocedure Evaluation (Addendum)
Anesthesia Evaluation  Patient identified by MRN, date of birth, ID band Patient awake    Reviewed: Allergy & Precautions, NPO status , Patient's Chart, lab work & pertinent test results, reviewed documented beta blocker date and time   History of Anesthesia Complications Negative for: history of anesthetic complications  Airway Mallampati: II  TM Distance: >3 FB Neck ROM: Full    Dental  (+) Chipped, Dental Advisory Given   Pulmonary  12/16/2020 SARS coronavirus NEG   breath sounds clear to auscultation       Cardiovascular hypertension, Pt. on medications and Pt. on home beta blockers (-) angina+ CAD (LM 60%, 3v ASCAD) and + Past MI (12/16/2020 NSTEMI)   Rhythm:Regular Rate:Normal  12/17/2020 ECHO: EF 60-65%, Grade 2 DD, no significant valvular abnormalities   Neuro/Psych CVA    GI/Hepatic Neg liver ROS, GERD  Controlled,  Endo/Other  diabetes (glu 135), Oral Hypoglycemic Agents, Insulin DependentMorbid obesity  Renal/GU negative Renal ROS     Musculoskeletal   Abdominal (+) + obese,   Peds  Hematology negative hematology ROS (+)   Anesthesia Other Findings   Reproductive/Obstetrics                            Anesthesia Physical Anesthesia Plan  ASA: IV  Anesthesia Plan: General   Post-op Pain Management:    Induction: Intravenous  PONV Risk Score and Plan: 2 and Treatment may vary due to age or medical condition  Airway Management Planned: Oral ETT  Additional Equipment: PA Cath, Arterial line, TEE and Ultrasound Guidance Line Placement  Intra-op Plan:   Post-operative Plan: Post-operative intubation/ventilation  Informed Consent: I have reviewed the patients History and Physical, chart, labs and discussed the procedure including the risks, benefits and alternatives for the proposed anesthesia with the patient or authorized representative who has indicated his/her understanding  and acceptance.     Dental advisory given  Plan Discussed with: CRNA and Surgeon  Anesthesia Plan Comments:        Anesthesia Quick Evaluation

## 2020-12-19 NOTE — Progress Notes (Addendum)
1410  Complained of chest pressure 5/10 mid sternum. Notified Judy Pimple PA and orders received. Started on nitroglycerin infusion at 10 mcg at 1411. 1413 pain intensity decreased to 3/10. 1418 pain free and BP 146/55.  1447 BP 150/65 patient complained of 2/10 chest pain increased nitroglycerin infusion to 15 mcg. Patient painfree at 1452.

## 2020-12-19 NOTE — Progress Notes (Signed)
Progress Note  Patient Name: Timothy Golden Date of Encounter: 12/19/2020  Primary Cardiologist: Dr. Eldridge Dace, MD   Subjective   Currently feeling well without chest pain or shortness of breath.  Ready for cardiac surgery CABG on Monday.  Inpatient Medications    Scheduled Meds: . aspirin EC  81 mg Oral Daily  . atorvastatin  80 mg Oral Daily  . [START ON 12/20/2020] epinephrine  0-10 mcg/min Intravenous To OR  . [START ON 12/20/2020] heparin-papaverine-plasmalyte irrigation   Irrigation To OR  . insulin aspart  0-20 Units Subcutaneous TID WC  . insulin aspart  0-30 Units Subcutaneous QHS  . [START ON 12/20/2020] insulin glargine  25 Units Subcutaneous BID  . insulin glargine  30 Units Subcutaneous Once  . [START ON 12/20/2020] insulin   Intravenous To OR  . losartan  50 mg Oral Daily  . [START ON 12/20/2020] magnesium sulfate  40 mEq Other To OR  . [START ON 12/20/2020] phenylephrine  30-200 mcg/min Intravenous To OR  . [START ON 12/20/2020] potassium chloride  80 mEq Other To OR  . [START ON 12/20/2020] tranexamic acid  15 mg/kg Intravenous To OR  . [START ON 12/20/2020] tranexamic acid  2 mg/kg Intracatheter To OR   Continuous Infusions: . sodium chloride 10 mL/hr at 12/17/20 2224  . [START ON 12/20/2020] cefUROXime (ZINACEF)  IV    . [START ON 12/20/2020] cefUROXime (ZINACEF)  IV    . [START ON 12/20/2020] dexmedetomidine    . [START ON 12/20/2020] heparin 30,000 units/NS 1000 mL solution for CELLSAVER    . heparin 2,200 Units/hr (12/19/20 0445)  . [START ON 12/20/2020] milrinone    . [START ON 12/20/2020] nitroGLYCERIN    . [START ON 12/20/2020] norepinephrine    . [START ON 12/20/2020] tranexamic acid (CYKLOKAPRON) infusion ([facility])    . [START ON 12/20/2020] tranexamic acid (CYKLOKAPRON) infusion ([facility])    . [START ON 12/20/2020] vancomycin     PRN Meds: sodium chloride, acetaminophen, nitroGLYCERIN, ondansetron (ZOFRAN) IV   Vital Signs    Vitals:   12/18/20 1240 12/18/20 1321 12/19/20 0448  12/19/20 0449  BP: (!) 157/60 (!) 147/58  135/65  Pulse:  (!) 59    Resp:  18 18   Temp:  98.4 F (36.9 C) 98 F (36.7 C)   TempSrc:  Oral Oral   SpO2:   100%   Weight:   136 kg   Height:        Intake/Output Summary (Last 24 hours) at 12/19/2020 0830 Last data filed at 12/19/2020 0300 Gross per 24 hour  Intake 1104.41 ml  Output --  Net 1104.41 ml   Filed Weights   12/17/20 0500 12/18/20 0632 12/19/20 0448  Weight: 136 kg 135.3 kg 136 kg    Physical Exam   GEN: Well nourished, well developed, in no acute distress  HEENT: normal  Neck: no JVD, carotid bruits, or masses Cardiac:  RRR; no murmurs, rubs, or gallops,no edema  Respiratory:  clear to auscultation bilaterally, normal work of breathing GI: soft, nontender, nondistended, + BS MS: no deformity or atrophy  Skin: warm and dry Neuro:  Strength and sensation are intact Psych: euthymic mood, full affect    Labs    Chemistry Recent Labs  Lab 12/16/20 0833 12/17/20 0739  NA 138 136  K 3.9 4.0  CL 104 102  CO2 21* 25  GLUCOSE 249* 267*  BUN 28* 17  CREATININE 0.87 0.89  CALCIUM 9.9 9.1  GFRNONAA >60 >  60  ANIONGAP 13 9     Hematology Recent Labs  Lab 12/17/20 0739 12/18/20 0024 12/19/20 0145  WBC 9.5 9.0 9.3  RBC 5.17 4.84 4.86  HGB 14.5 13.5 13.4  HCT 44.0 41.4 41.1  MCV 85.1 85.5 84.6  MCH 28.0 27.9 27.6  MCHC 33.0 32.6 32.6  RDW 13.5 13.6 13.6  PLT 281 244 246    Cardiac EnzymesNo results for input(s): TROPONINI in the last 168 hours. No results for input(s): TROPIPOC in the last 168 hours.   BNPNo results for input(s): BNP, PROBNP in the last 168 hours.   DDimer No results for input(s): DDIMER in the last 168 hours.   Radiology    CT ANGIO NECK W OR WO CONTRAST  Result Date: 12/18/2020 CLINICAL DATA:  Initial evaluation for carotid stenosis screening. EXAM: CT ANGIOGRAPHY NECK TECHNIQUE: Multidetector CT imaging of the neck was performed using the standard protocol during bolus  administration of intravenous contrast. Multiplanar CT image reconstructions and MIPs were obtained to evaluate the vascular anatomy. Carotid stenosis measurements (when applicable) are obtained utilizing NASCET criteria, using the distal internal carotid diameter as the denominator. CONTRAST:  75mL OMNIPAQUE IOHEXOL 350 MG/ML SOLN COMPARISON:  None available. FINDINGS: Aortic arch: Visualized aortic arch normal caliber with normal branch pattern. Mild plaque along the undersurface of the arch itself. No hemodynamically significant stenosis about the origin of the great vessels. Visualized subclavian arteries widely patent. The Right carotid system: Right CCA patent from its origin to the bifurcation without stenosis. Mixed plaque about the right carotid bulb/proximal right ICA with associated stenosis of up to 60% by NASCET criteria (series 7, image 111). Additionally, there is occlusion of the right external carotid artery at its origin with evidence of distal reconstitution (series 7, image 106). Distally, right ICA remains patent without stenosis, dissection, or occlusion. Left carotid system: Left CCA patent from its origin to the bifurcation without stenosis. Mixed concentric plaque about the left carotid bulb/proximal left ICA with associated approximate 70% stenosis by NASCET criteria (series 7, image 111). Severe near occlusive stenosis noted at the origin of the left external carotid artery as well (series 7, image 105). Distally, the left ICA is patent without stenosis, dissection, or occlusion. Vertebral arteries: Left vertebral artery diffusely hypoplastic and arises directly from the aortic arch. Hypoplastic left vertebral artery patent within the neck and appears to terminate in PICA. Dominant right vertebral artery occludes just distal to its origin (series 7, image 182) irregular distal reconstitution at the proximal V2 segment. Irregular attenuated flow seen distally within the right V2 and V3  segments with associated multifocal moderate to severe stenoses. Right vertebral artery is patent as it courses into the cranial vault. Additional severe proximal right V4 stenosis noted (series 7, image 47). Additionally, the basilar artery is markedly diminutive with multifocal severe stenoses (series 7, images 27, 21, 12). Partially visualized SCA is are perfused. PCAs not visualized on this exam. Skeleton: No visible acute osseous finding. No discrete or worrisome osseous lesions. Other neck: No other acute soft tissue abnormality within the neck. No mass or adenopathy. Upper chest: Visualized upper chest demonstrates no acute finding. IMPRESSION: 1. 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 2. 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 Additional severe near occlusive stenosis at the origin of the left external carotid artery. 3. Left vertebral artery diffusely hypoplastic and  arises directly from the aortic arch. Dominant right vertebral artery occludes just distal to its origin, with irregular distal reconstitution at the proximal V2 segment. Additional moderate to severe multifocal stenoses throughout the right V2 through V4 segments. 4. 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 PCAs not visualized on this exam. Electronically Signed   By: Rise Mu M.D.   On: 12/18/2020 22:39   ECHOCARDIOGRAM COMPLETE  Result Date: 12/17/2020    ECHOCARDIOGRAM REPORT   Patient Name:   COURY GRIEGER Date of Exam: 12/17/2020 Medical Rec #:  [id number]    Height:       72.0 in Accession #:    [id number]   Weight:       299.8 lb Date of Birth:  September 10, 1965    BSA:          2.531 m Patient Age:    55 years     BP:           134/52 mmHg Patient Gender: M            HR:           49 bpm. Exam Location:  Inpatient Procedure: 2D Echo, Color Doppler and  Cardiac Doppler Indications:    NSTEMI I21.4  History:        Patient has no prior history of Echocardiogram examinations.                 Stroke; Risk Factors:Hypertension, Diabetes and Dyslipidemia.  Sonographer:    Eulah Pont RDCS Referring Phys: [phone] JILL D MCDANIEL IMPRESSIONS  1. Left ventricular ejection fraction, by estimation, is 60 to 65%. The left ventricle has normal function. The left ventricle has no regional wall motion abnormalities. Left ventricular diastolic parameters are consistent with Grade II diastolic dysfunction (pseudonormalization). Elevated left ventricular end-diastolic pressure.  2. Right ventricular systolic function is normal. The right ventricular size is normal. There is normal pulmonary artery systolic pressure.  3. Left atrial size was moderately dilated.  4. The mitral valve is normal in structure. Trivial mitral valve regurgitation. No evidence of mitral stenosis.  5. The aortic valve is tricuspid. Aortic valve regurgitation is not visualized. No aortic stenosis is present.  6. Aortic dilatation noted. There is mild dilatation of the ascending aorta, measuring 37 mm.  7. The inferior vena cava is normal in size with greater than 50% respiratory variability, suggesting right atrial pressure of 3 mmHg. FINDINGS  Left Ventricle: Left ventricular ejection fraction, by estimation, is 60 to 65%. The left ventricle has normal function. The left ventricle has no regional wall motion abnormalities. The left ventricular internal cavity size was normal in size. There is  no left ventricular hypertrophy. Left ventricular diastolic parameters are consistent with Grade II diastolic dysfunction (pseudonormalization). Elevated left ventricular end-diastolic pressure. Right Ventricle: The right ventricular size is normal. No increase in right ventricular wall thickness. Right ventricular systolic function is normal. There is normal pulmonary artery systolic pressure. The tricuspid  regurgitant velocity is 1.42 m/s, and  with an assumed right atrial pressure of 3 mmHg, the estimated right ventricular systolic pressure is 11.1 mmHg. Left Atrium: Left atrial size was moderately dilated. Right Atrium: Right atrial size was normal in size. Pericardium: There is no evidence of pericardial effusion. Mitral Valve: The mitral valve is normal in structure. Trivial mitral valve regurgitation. No evidence of mitral valve stenosis. Tricuspid Valve: The tricuspid valve is normal in structure. Tricuspid valve regurgitation is trivial. No evidence of tricuspid stenosis.  Aortic Valve: The aortic valve is tricuspid. Aortic valve regurgitation is not visualized. No aortic stenosis is present. Pulmonic Valve: The pulmonic valve was normal in structure. Pulmonic valve regurgitation is not visualized. No evidence of pulmonic stenosis. Aorta: Aortic dilatation noted. There is mild dilatation of the ascending aorta, measuring 37 mm. Venous: The inferior vena cava is normal in size with greater than 50% respiratory variability, suggesting right atrial pressure of 3 mmHg. IAS/Shunts: No atrial level shunt detected by color flow Doppler.  LEFT VENTRICLE PLAX 2D LVIDd:         5.20 cm  Diastology LVIDs:         3.20 cm  LV e' medial:    4.87 cm/s LV PW:         0.90 cm  LV E/e' medial:  16.9 LV IVS:        0.80 cm  LV e' lateral:   11.90 cm/s LVOT diam:     2.10 cm  LV E/e' lateral: 6.9 LV SV:         81 LV SV Index:   32 LVOT Area:     3.46 cm  RIGHT VENTRICLE RV S prime:     10.60 cm/s TAPSE (M-mode): 2.0 cm LEFT ATRIUM             Index       RIGHT ATRIUM           Index LA diam:        3.60 cm 1.42 cm/m  RA Area:     12.20 cm LA Vol (A2C):   45.2 ml 17.86 ml/m RA Volume:   19.90 ml  7.86 ml/m LA Vol (A4C):   88.8 ml 35.09 ml/m LA Biplane Vol: 70.7 ml 27.94 ml/m  AORTIC VALVE LVOT Vmax:   90.60 cm/s LVOT Vmean:  59.800 cm/s LVOT VTI:    0.235 m  AORTA Ao Root diam: 3.40 cm Ao Asc diam:  3.70 cm MITRAL VALVE                TRICUSPID VALVE MV Area (PHT): 2.08 cm    TR Peak grad:   8.1 mmHg MV Decel Time: 364 msec    TR Vmax:        142.00 cm/s MV E velocity: 82.50 cm/s MV A velocity: 34.90 cm/s  SHUNTS MV E/A ratio:  2.36        Systemic VTI:  0.24 m                            Systemic Diam: 2.10 cm Chilton Siiffany Mier MD Electronically signed by Chilton Siiffany Camp MD Signature Date/Time: 12/17/2020/11:46:42 AM    Final    VAS US DOPPLER PRE CABG  Result Date: 12/18/2020 PREOPERATIVE VASCULAR EVALUATION Patient Name:  Rudi RummageREVOR Delosreyes  Date of Exam:   12/18/2020 Medical Rec #: [id number]     Accession #:    [id number] Date of Birth: 04-04-1966     Patient Gender: M Patient Age:   [id number] [facility] Procedure:      VAS US DOPPLER PRE CABG Referring Phys: [id number] BRYAN K BARTLE --------------------------------------------------------------------------------  Indications:      Pre-CABG. Risk Factors:     Hypertension, hyperlipidemia, Diabetes, coronary artery                   disease, prior CVA. Comparison Study: No prior study on file Performing Technologist: Sherren KernsKanady, Candace  RVS  Examination Guidelines: A complete evaluation includes B-mode imaging, spectral Doppler, color Doppler, and power Doppler as needed of all accessible portions of each vessel. Bilateral testing is considered an integral part of a complete examination. Limited examinations for reoccurring indications may be performed as noted.  Right Carotid Findings: +----------+--------+--------+--------+------------+--------+           PSV cm/sEDV cm/sStenosisDescribe    Comments +----------+--------+--------+--------+------------+--------+ CCA Prox  111     27              heterogenous         +----------+--------+--------+--------+------------+--------+ CCA Distal67      20              heterogenous         +----------+--------+--------+--------+------------+--------+ ICA Prox  240     64      60-79%  calcific              +----------+--------+--------+--------+------------+--------+ ICA Mid   158     42                                   +----------+--------+--------+--------+------------+--------+ ICA Distal163     54                                   +----------+--------+--------+--------+------------+--------+ ECA       220     56                                   +----------+--------+--------+--------+------------+--------+ Portions of this table do not appear on this page. +----------+--------+-------+--------+------------+           PSV cm/sEDV cmsDescribeArm Pressure +----------+--------+-------+--------+------------+ Subclavian109                                 +----------+--------+-------+--------+------------+ +---------+--------+---+--------+--+ VertebralPSV cm/s149EDV cm/s38 +---------+--------+---+--------+--+ Left Carotid Findings: +----------+--------+--------+--------+--------+------------------+           PSV cm/sEDV cm/sStenosisDescribeComments           +----------+--------+--------+--------+--------+------------------+ CCA Prox  113     16                      intimal thickening +----------+--------+--------+--------+--------+------------------+ CCA Distal55      16                      intimal thickening +----------+--------+--------+--------+--------+------------------+ ICA Prox  480     151     80-99%  calcificShadowing          +----------+--------+--------+--------+--------+------------------+ ICA Mid   402     119                                        +----------+--------+--------+--------+--------+------------------+ ICA Distal122     27                                         +----------+--------+--------+--------+--------+------------------+ ECA       191     27                                         +----------+--------+--------+--------+--------+------------------+  +----------+--------+--------+--------+------------+  SubclavianPSV cm/sEDV cm/sDescribeArm Pressure +----------+--------+--------+--------+------------+           141                                  +----------+--------+--------+--------+------------+ +---------+--------+---+--------+--+ VertebralPSV cm/s150EDV cm/s15 +---------+--------+---+--------+--+  ABI Findings: +--------+------------------+-----+-----------+---------------+ Right   Rt Pressure (mmHg)IndexWaveform   Comment         +--------+------------------+-----+-----------+---------------+ Brachial                       multiphasicGlucose monitor +--------+------------------+-----+-----------+---------------+ PTA     158               0.94 multiphasic                +--------+------------------+-----+-----------+---------------+ DP      164               0.98 multiphasic                +--------+------------------+-----+-----------+---------------+ +--------+------------------+-----+-----------+-------+ Left    Lt Pressure (mmHg)IndexWaveform   Comment +--------+------------------+-----+-----------+-------+ [id number]                    multiphasic        +--------+------------------+-----+-----------+-------+ PTA     170               1.01 multiphasic        +--------+------------------+-----+-----------+-------+ DP      167               0.99 multiphasic        +--------+------------------+-----+-----------+-------+ +-------+---------------+----------------+ ABI/TBIToday's ABI/TBIPrevious ABI/TBI +-------+---------------+----------------+ Right  0.98                            +-------+---------------+----------------+ Left   1.01                            +-------+---------------+----------------+  Right Doppler Findings: +--------+--------+-----+-----------+------------------------------------------+ Site    PressureIndexDoppler    Comments                                    +--------+--------+-----+-----------+------------------------------------------+ Brachial             multiphasicGlucose monitor                            +--------+--------+-----+-----------+------------------------------------------+ Radial               multiphasic                                           +--------+--------+-----+-----------+------------------------------------------+ Ulnar                multiphasic                                           +--------+--------+-----+-----------+------------------------------------------+ Digit                           Doppler signal remains normal with radial  compression and reverses with ulnar                                        compression                                +--------+--------+-----+-----------+------------------------------------------+  Left Doppler Findings: +-----------+--------+-----+-----------+---------------------------------------+ Site       PressureIndexDoppler    Comments                                +-----------+--------+-----+-----------+---------------------------------------+ Brachial   168          multiphasic                                        +-----------+--------+-----+-----------+---------------------------------------+ Radial                  multiphasic                                        +-----------+--------+-----+-----------+---------------------------------------+ Ulnar                   multiphasic                                        +-----------+--------+-----+-----------+---------------------------------------+ Palmar Arch                        Doppler signal remains normal with                                         radial compression and reverses with                                       ulnar compression                        +-----------+--------+-----+-----------+---------------------------------------+  Summary: Right Carotid: Velocities in the right ICA are consistent with a 60-79%                stenosis. Left Carotid: Velocities in the left ICA are consistent with a 80-99% stenosis. Vertebrals:  Bilateral vertebral arteries demonstrate antegrade flow. Subclavians: Normal flow hemodynamics were seen in bilateral subclavian              arteries. Right ABI: Resting right ankle-brachial index is within normal range. No evidence of significant right lower extremity arterial disease. Left ABI: Resting left ankle-brachial index is within normal range. No evidence of significant left lower extremity arterial disease. Right Upper Extremity: Doppler waveforms remain within normal limits with right radial compression. Doppler waveforms remain within normal limits with right ulnar compression. Left Upper Extremity: Doppler waveforms remain within normal limits with left radial compression. Doppler waveforms remain within normal limits with left ulnar compression.  Electronically signed by  Coral Else MD on 12/18/2020 at 4:11:38 PM.    Final    Telemetry    Sinus rhythm-personally reviewed  ECG    None new  Cardiac Studies   LHC 12/16/20:   Ost LAD to Prox LAD lesion is 100% stenosed. Right to left collaterals are present.  Dist LM lesion is 60% stenosed.  Ramus lesion is 75% stenosed.  Ost Cx lesion is 60% stenosed.  The left ventricular ejection fraction is 35-45% by visual estimate.  There is mild to moderate left ventricular systolic dysfunction.  LV end diastolic pressure is mildly elevated.  There is no aortic valve stenosis.   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  Patient is hemodynamically stable without chest pain.  Restart IV heparin in 8 hours.    Can add IV NTG if he has any discomfort or hypertension.    Diagnostic Dominance: Co-dominant     Patient Profile      55 y.o. male with a hx of DM1, HTN, HLD, and morbid obesity with a BMI > 40 who presented to [facility] 12/16/20 with chest pain.  Assessment & Plan    1.  Non-STEMI: Presented to the hospital with chest pain, found to have troponin elevation to 593.  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  Plan for CABG on Monday.  Remains chest pain-free.    2.  Type 1 diabetes: Follows closely as an outpatient with endocrinology.  Have consulted internal medicine for assistance with blood sugars.    3.  Hypertension: Currently stable.    4.  Hyperlipidemia: Continue atorvastatin.  LDL goal less than 70.  5.  Morbid obesity: BMI greater than 40.  Justis Closser need diet and exercise as an outpatient.  Signed, Loman Brooklyn, MD [facility] 12/19/2020, 8:30 AM     For questions or updates, please contact   Please consult [email or web address] for contact info under Cardiology/STEMI.

## 2020-12-19 NOTE — Progress Notes (Addendum)
1115 Patient called complaining of mid sternal chest pressure, "feels like a weight on my chest" 2/10. BP 161/86. Given sl nitro at 1116. Patient reported sweating at 1117 and resolved at 1118. 119 BP 159/82 and rated pressure at 0.5/10.   1130 176/89, complained of 2/10 pressure again with sweating and given sl nitro at 1132. 1133 stated the sweating resolved and pressure slightly better. 1136 no pressure or sweating. Instructed to call with any further s/s.

## 2020-12-19 NOTE — Progress Notes (Signed)
ANTICOAGULATION CONSULT NOTE - Follow-Up Consult  Pharmacy Consult for heparin Indication: chest pain/ACS  Patient Measurements: Height: 6' (182.9 cm) Weight: 136 kg (299 lb 12.8 oz) IBW/kg (Calculated) : 77.6 Heparin Dosing Weight: 108.7kg  Vital Signs: Temp: 98 F (36.7 C) (05/01 0448) Temp Source: Oral (05/01 0448) BP: 135/65 (05/01 0449)  Labs: Recent Labs    12/16/20 0833 12/16/20 1054 12/17/20 0739 12/17/20 1610 12/18/20 0024 12/18/20 0749 12/19/20 0145  HGB 14.5  --  14.5  --  13.5  --  13.4  HCT 44.8  --  44.0  --  41.4  --  41.1  PLT 299  --  281  --  244  --  246  HEPARINUNFRC  --   --  <0.10*   < > 0.14* 0.39 0.33  CREATININE 0.87  --  0.89  --   --   --   --   TROPONINIHS 32* 893*  --   --   --   --   --    < > = values in this interval not displayed.    Estimated Creatinine Clearance: 134 mL/min (by C-G formula based on SCr of 0.89 mg/dL).   Assessment: 68 YOM who presented with CP/NSTEMI and is now s/p cath which showed complex distal main disease - consulting CVTS. Now planned for surgery on Mon, 5/2.  Heparin level remains therapeutic on rate at 2200 units/hr. CBC stable. No bleeding noted. CBC wnl/stable.   Goal of Therapy:  Heparin level 0.3-0.7 units/ml Monitor platelets by anticoagulation protocol: Yes   Plan:  Continue IV heparin 2200 units/hr Monitor daily heparin level, CBC, s/s bleeding Plans for CABG Monday 5/2  Pervis Hocking, PharmD PGY1 Pharmacy Resident 12/19/2020 7:21 AM  Please check [email or web address] for unit-specific pharmacy phone numbers.

## 2020-12-19 NOTE — Progress Notes (Signed)
PROGRESS NOTE                                                                                                                                                                                                             Patient Demographics:    Timothy Golden, is a 55 y.o. male, DOB - [birth year], ZOX:[id number]  Outpatient Primary MD for the patient is [facility], [facility]    LOS - 2  Admit date - 12/16/2020    Chief Complaint  Patient presents with  . Chest Pain       Brief Narrative (HPI from H&P) Timothy Golden is an 55 y.o. male with H/O DM1 since age 59.  He is admitted to hospital currently by Cardiology for NSTEMI and planned to undergo CABG on Monday, hospitalist team was consulted to manage diabetes.   Subjective:    [name] today has, No headache, No chest pain, No abdominal pain - No Nausea, No new weakness tingling or numbness, no SOB   Assessment  & Plan :     1. DM2  - now insulin-dependent, poor outpatient control  Due to hyperglycemia , A1c was 8.2, question dietary compliance as well.  Currently on twice a day Lantus along with sliding scale, stop all insulin products evening of 12/19/2020 and start insulin drip per protocol midnight of 12/28/2020 preparing for left heart cath, may supplement with D5 W gentle drip if needed Target blood glucose between 150-200.  Lab Results  Component Value Date   HGBA1C 8.2 (H) 12/17/2020   CBG (last 3)  Recent Labs    12/18/20 2144 12/19/20 0311 12/19/20 0823  GLUCAP 261* 146* 135*      2. NSTEMI - per primary Cards team, tentative left heart cath on 12/20/2020.         Condition - Extremely Guarded  Family Communication  : per Primary team Cards  Code Status :  Full  Consults  : [facility] consulting    Procedures  :             DVT Prophylaxis  :  Per primary team    Lab Results  Component Value Date   PLT 246 12/19/2020    Diet :   Diet Order            Diet Carb Modified Fluid  consistency: Thin; Room service appropriate? Yes  Diet effective now                  Inpatient Medications  Scheduled Meds: . aspirin EC  81 mg Oral Daily  . atorvastatin  80 mg Oral Daily  . [START ON 12/20/2020] epinephrine  0-10 mcg/min Intravenous To OR  . [START ON 12/20/2020] heparin-papaverine-plasmalyte irrigation   Irrigation To OR  . insulin aspart  0-20 Units Subcutaneous TID WC  . insulin glargine  30 Units Subcutaneous Once  . [START ON 12/20/2020] insulin   Intravenous To OR  . losartan  50 mg Oral Daily  . [START ON 12/20/2020] magnesium sulfate  40 mEq Other To OR  . [START ON 12/20/2020] phenylephrine  30-200 mcg/min Intravenous To OR  . [START ON 12/20/2020] potassium chloride  80 mEq Other To OR  . [START ON 12/20/2020] tranexamic acid  15 mg/kg Intravenous To OR  . [START ON 12/20/2020] tranexamic acid  2 mg/kg Intracatheter To OR   Continuous Infusions: . sodium chloride 10 mL/hr at 12/17/20 2224  . [START ON 12/20/2020] cefUROXime (ZINACEF)  IV    . [START ON 12/20/2020] cefUROXime (ZINACEF)  IV    . [START ON 12/20/2020] dexmedetomidine    . [START ON 12/20/2020] heparin 30,000 units/NS 1000 mL solution for CELLSAVER    . heparin 2,200 Units/hr (12/19/20 0445)  . [START ON 12/20/2020] milrinone    . [START ON 12/20/2020] nitroGLYCERIN    . [START ON 12/20/2020] norepinephrine    . [START ON 12/20/2020] tranexamic acid (CYKLOKAPRON) infusion ([facility])    . [START ON 12/20/2020] tranexamic acid (CYKLOKAPRON) infusion ([facility])    . [START ON 12/20/2020] vancomycin     PRN Meds:.sodium chloride, acetaminophen, nitroGLYCERIN, ondansetron (ZOFRAN) IV  Antibiotics  :    Anti-infectives (From admission, onward)   Start     Dose/Rate Route Frequency Ordered Stop   12/20/20 0400  vancomycin (VANCOREADY) IVPB 1500 mg/300 mL        1,500 mg 150 mL/hr over 120 Minutes Intravenous To Surgery 12/17/20 1248 12/21/20 0400   12/20/20 0400  cefUROXime  (ZINACEF) 1.5 g in sodium chloride 0.9 % 100 mL IVPB        1.5 g 200 mL/hr over 30 Minutes Intravenous To Surgery 12/17/20 1248 12/21/20 0400   12/20/20 0400  cefUROXime (ZINACEF) 750 mg in sodium chloride 0.9 % 100 mL IVPB        750 mg 200 mL/hr over 30 Minutes Intravenous To Surgery 12/17/20 1248 12/21/20 0400       Time Spent in minutes  30   Susa Raring M.D on 12/19/2020 at 9:39 AM  To page go to [email or web address]   [facility] -  Office  [phone]  See all Orders from today for further details    Objective:   Vitals:   12/18/20 1321 12/19/20 0448 12/19/20 0449 12/19/20 0854  BP: (!) 147/58  135/65   Pulse: (!) 59   66  Resp: Temp: 98.4 F (36.9 C) 98 F (36.7 C)  97.9 F (36.6 C)  TempSrc: Oral Oral  Oral  SpO2:  100%  97%  Weight:  136 kg    Height:        Wt Readings from Last 3 Encounters:  12/19/20 136 kg     Intake/Output Summary (Last 24 hours) at 12/19/2020 0939 Last data filed at 12/19/2020 0300 Gross per 24 hour  Intake 1004.41 ml  Output --  Net 1004.41 ml     Physical Exam  Awake Alert, No new F.N deficits, Normal affect [place].AT,PERRAL Supple Neck,No JVD, No cervical lymphadenopathy appriciated.  Symmetrical Chest wall movement, Good air movement bilaterally, CTAB RRR,No Gallops, Rubs or new Murmurs, No Parasternal Heave +ve B.Sounds, Abd Soft, No tenderness, No organomegaly appriciated, No rebound - guarding or rigidity. No Cyanosis, Clubbing or edema, No new Rash or bruise     Data Review:    CBC Recent Labs  Lab 12/16/20 0833 12/17/20 0739 12/18/20 0024 12/19/20 0145  WBC 11.3* 9.5 9.0 9.3  HGB 14.5 14.5 13.5 13.4  HCT 44.8 44.0 41.4 41.1  PLT 299 281 244 246  MCV 85.8 85.1 85.5 84.6  MCH 27.8 28.0 27.9 27.6  MCHC 32.4 33.0 32.6 32.6  RDW 13.6 13.5 13.6 13.6    Recent Labs  Lab 12/16/20 0833 12/16/20 1807 12/17/20 0739  NA 138  --  136  K 3.9  --  4.0  CL 104  --  102  CO2 21*  --  25   GLUCOSE 249*  --  267*  BUN 28*  --  17  CREATININE 0.87  --  0.89  CALCIUM 9.9  --  9.1  HGBA1C  --    < > 8.2*   < > = values in this interval not displayed.    ------------------------------------------------------------------------------------------------------------------ Recent Labs    12/17/20 0739  CHOL 156  HDL 34*  LDLCALC 103*  TRIG 94  CHOLHDL 4.6    Lab Results  Component Value Date   HGBA1C 8.2 (H) 12/17/2020   ------------------------------------------------------------------------------------------------------------------ No results for input(s): TSH, T4TOTAL, T3FREE, THYROIDAB in the last 72 hours.  Invalid input(s): FREET3  Cardiac Enzymes No results for input(s): CKMB, TROPONINI, MYOGLOBIN in the last 168 hours.  Invalid input(s): CK ------------------------------------------------------------------------------------------------------------------ No results found for: BNP  Micro Results Recent Results (from the past 240 hour(s))  Resp Panel by RT-PCR (Flu A&B, Covid) Nasopharyngeal Swab     Status: None   Collection Time: 12/16/20 12:44 PM   Specimen: Nasopharyngeal Swab; Nasopharyngeal(NP) swabs in vial transport medium  Result Value Ref Range Status   SARS Coronavirus 2 by RT PCR NEGATIVE NEGATIVE Final    Comment: (NOTE) SARS-CoV-2 target nucleic acids are NOT DETECTED.  The SARS-CoV-2 RNA is generally detectable in upper respiratory specimens during the acute phase of infection. The lowest concentration of SARS-CoV-2 viral copies this assay can detect is 138 copies/mL. A negative result does not preclude SARS-Cov-2 infection and should not be used as the sole basis for treatment or other patient management decisions. A negative result may occur with  improper specimen collection/handling, submission of specimen other than nasopharyngeal swab, presence of viral mutation(s) within the areas targeted by this assay, and inadequate number of  viral copies(<138 copies/mL). A negative result must be combined with clinical observations, patient history, and epidemiological information. The expected result is Negative.  Fact Sheet for Patients:  BloggerCourse.com  Fact Sheet for Healthcare Providers:  SeriousBroker.it  This test is no t yet approved or cleared by the [place] FDA and  has been authorized for detection and/or diagnosis of SARS-CoV-2 by FDA under an Emergency Use Authorization (EUA). This EUA will remain  in effect (meaning this test can be used) for the duration of the COVID-19 declaration under Section 564(b)(1) of the Act, 21 U.S.C.section 360bbb-3(b)(1), unless the authorization is terminated  or revoked sooner.       Influenza A by PCR NEGATIVE NEGATIVE Final  Influenza B by PCR NEGATIVE NEGATIVE Final    Comment: (NOTE) The Xpert Xpress SARS-CoV-2/FLU/RSV plus assay is intended as an aid in the diagnosis of influenza from Nasopharyngeal swab specimens and should not be used as a sole basis for treatment. Nasal washings and aspirates are unacceptable for Xpert Xpress SARS-CoV-2/FLU/RSV testing.  Fact Sheet for Patients: BloggerCourse.com  Fact Sheet for Healthcare Providers: SeriousBroker.it  This test is not yet approved or cleared by the [place] FDA and has been authorized for detection and/or diagnosis of SARS-CoV-2 by FDA under an Emergency Use Authorization (EUA). This EUA will remain in effect (meaning this test can be used) for the duration of the COVID-19 declaration under Section 564(b)(1) of the Act, 21 U.S.C. section 360bbb-3(b)(1), unless the authorization is terminated or revoked.  Performed at [facility], [address], [place], [place] [address]     Radiology Reports CT ANGIO NECK W OR WO CONTRAST  Result Date: 12/18/2020 CLINICAL DATA:   Initial evaluation for carotid stenosis screening. EXAM: CT ANGIOGRAPHY NECK TECHNIQUE: Multidetector CT imaging of the neck was performed using the standard protocol during bolus administration of intravenous contrast. Multiplanar CT image reconstructions and MIPs were obtained to evaluate the vascular anatomy. Carotid stenosis measurements (when applicable) are obtained utilizing NASCET criteria, using the distal internal carotid diameter as the denominator. CONTRAST:  75mL OMNIPAQUE IOHEXOL 350 MG/ML SOLN COMPARISON:  None available. FINDINGS: Aortic arch: Visualized aortic arch normal caliber with normal branch pattern. Mild plaque along the undersurface of the arch itself. No hemodynamically significant stenosis about the origin of the great vessels. Visualized subclavian arteries widely patent. The Right carotid system: Right CCA patent from its origin to the bifurcation without stenosis. Mixed plaque about the right carotid bulb/proximal right ICA with associated stenosis of up to 60% by NASCET criteria (series 7, image 111). Additionally, there is occlusion of the right external carotid artery at its origin with evidence of distal reconstitution (series 7, image 106). Distally, right ICA remains patent without stenosis, dissection, or occlusion. Left carotid system: Left CCA patent from its origin to the bifurcation without stenosis. Mixed concentric plaque about the left carotid bulb/proximal left ICA with associated approximate 70% stenosis by NASCET criteria (series 7, image 111). Severe near occlusive stenosis noted at the origin of the left external carotid artery as well (series 7, image 105). Distally, the left ICA is patent without stenosis, dissection, or occlusion. Vertebral arteries: Left vertebral artery diffusely hypoplastic and arises directly from the aortic arch. Hypoplastic left vertebral artery patent within the neck and appears to terminate in PICA. Dominant right vertebral artery occludes  just distal to its origin (series 7, image 182) irregular distal reconstitution at the proximal V2 segment. Irregular attenuated flow seen distally within the right V2 and V3 segments with associated multifocal moderate to severe stenoses. Right vertebral artery is patent as it courses into the cranial vault. Additional severe proximal right V4 stenosis noted (series 7, image 47). Additionally, the basilar artery is markedly diminutive with multifocal severe stenoses (series 7, images 27, 21, 12). Partially visualized SCA is are perfused. PCAs not visualized on this exam. Skeleton: No visible acute osseous finding. No discrete or worrisome osseous lesions. Other neck: No other acute soft tissue abnormality within the neck. No mass or adenopathy. Upper chest: Visualized upper chest demonstrates no acute finding. IMPRESSION: 1. 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 2. 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 Additional severe near occlusive stenosis at the origin of the left external carotid artery. 3. 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 Dominant right vertebral artery occludes just distal to its origin, with irregular distal reconstitution at the proximal V2 segment. Additional moderate to severe multifocal stenoses throughout the right V2 through V4 segments. 4. 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 PCAs not visualized on this exam. Electronically Signed   By: Rise MuBenjamin  McClintock M.D.   On: 12/18/2020 22:39   CARDIAC CATHETERIZATION  Result Date: 12/16/2020  Ost LAD to Prox LAD lesion is 100% stenosed. Right to left collaterals are present.  Dist LM lesion is 60% stenosed.  Ramus lesion is 75% stenosed.  Ost Cx lesion is 60% stenosed.  The left ventricular ejection fraction is 35-45%  by visual estimate.  There is mild to moderate left ventricular systolic dysfunction.  LV end diastolic pressure is mildly elevated.  There is no aortic valve stenosis.  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  Patient is hemodynamically stable without chest pain. Restart IV heparin in 8 hours.  Can add IV NTG if he has any discomfort or hypertension.   I discussed the findings with the patient and his wife.   DG Chest Port 1 View  Result Date: 12/16/2020 CLINICAL DATA:  Chest pain and pressure beginning today. EXAM: PORTABLE CHEST 1 VIEW COMPARISON:  None. FINDINGS: Artifact overlies the chest. Heart size is normal. Mediastinal shadows are normal. The lungs are clear. No edema or effusion. No focal bone finding. IMPRESSION: No active disease. Electronically Signed   By: Paulina FusiMark  Shogry M.D.   On: 12/16/2020 08:49   ECHOCARDIOGRAM COMPLETE  Result Date: 12/17/2020    ECHOCARDIOGRAM REPORT   Patient Name:   Rudi RummageREVOR Leveque Date of Exam: 12/17/2020 Medical Rec #:  [id number]    Height:       72.0 in Accession #:    [id number]   Weight:       299.8 lb Date of Birth:  [birth year]    BSA:          2.531 m Patient Age:    55 years     BP:           134/52 mmHg Patient Gender: M            HR:           49 bpm. Exam Location:  Inpatient Procedure: 2D Echo, Color Doppler and Cardiac Doppler Indications:    NSTEMI I21.4  History:        Patient has no prior history of Echocardiogram examinations.                 Stroke; Risk Factors:Hypertension, Diabetes and Dyslipidemia.  Sonographer:    Eulah PontSarah Pirrotta RDCS Referring Phys: [phone] JILL D MCDANIEL IMPRESSIONS  1. Left ventricular ejection fraction, by estimation, is 60 to 65%. The left ventricle has normal function. The left ventricle has no regional wall motion abnormalities. Left ventricular diastolic parameters are consistent with Grade II diastolic dysfunction (pseudonormalization). Elevated left ventricular end-diastolic pressure.  2. Right  ventricular systolic function is normal. The right ventricular size is normal. There is normal pulmonary artery systolic pressure.  3. Left atrial size was moderately dilated.  4. The mitral valve is normal in structure. Trivial mitral valve regurgitation. No evidence of mitral stenosis.  5. The aortic valve is  tricuspid. Aortic valve regurgitation is not visualized. No aortic stenosis is present.  6. Aortic dilatation noted. There is mild dilatation of the ascending aorta, measuring 37 mm.  7. The inferior vena cava is normal in size with greater than 50% respiratory variability, suggesting right atrial pressure of 3 mmHg. FINDINGS  Left Ventricle: Left ventricular ejection fraction, by estimation, is 60 to 65%. The left ventricle has normal function. The left ventricle has no regional wall motion abnormalities. The left ventricular internal cavity size was normal in size. There is  no left ventricular hypertrophy. Left ventricular diastolic parameters are consistent with Grade II diastolic dysfunction (pseudonormalization). Elevated left ventricular end-diastolic pressure. Right Ventricle: The right ventricular size is normal. No increase in right ventricular wall thickness. Right ventricular systolic function is normal. There is normal pulmonary artery systolic pressure. The tricuspid regurgitant velocity is 1.42 m/s, and  with an assumed right atrial pressure of 3 mmHg, the estimated right ventricular systolic pressure is 11.1 mmHg. Left Atrium: Left atrial size was moderately dilated. Right Atrium: Right atrial size was normal in size. Pericardium: There is no evidence of pericardial effusion. Mitral Valve: The mitral valve is normal in structure. Trivial mitral valve regurgitation. No evidence of mitral valve stenosis. Tricuspid Valve: The tricuspid valve is normal in structure. Tricuspid valve regurgitation is trivial. No evidence of tricuspid stenosis. Aortic Valve: The aortic valve is tricuspid. Aortic  valve regurgitation is not visualized. No aortic stenosis is present. Pulmonic Valve: The pulmonic valve was normal in structure. Pulmonic valve regurgitation is not visualized. No evidence of pulmonic stenosis. Aorta: Aortic dilatation noted. There is mild dilatation of the ascending aorta, measuring 37 mm. Venous: The inferior vena cava is normal in size with greater than 50% respiratory variability, suggesting right atrial pressure of 3 mmHg. IAS/Shunts: No atrial level shunt detected by color flow Doppler.  LEFT VENTRICLE PLAX 2D LVIDd:         5.20 cm  Diastology LVIDs:         3.20 cm  LV e' medial:    4.87 cm/s LV PW:         0.90 cm  LV E/e' medial:  16.9 LV IVS:        0.80 cm  LV e' lateral:   11.90 cm/s LVOT diam:     2.10 cm  LV E/e' lateral: 6.9 LV SV:         81 LV SV Index:   32 LVOT Area:     3.46 cm  RIGHT VENTRICLE RV S prime:     10.60 cm/s TAPSE (M-mode): 2.0 cm LEFT ATRIUM             Index       RIGHT ATRIUM           Index LA diam:        3.60 cm 1.42 cm/m  RA Area:     12.20 cm LA Vol (A2C):   45.2 ml 17.86 ml/m RA Volume:   19.90 ml  7.86 ml/m LA Vol (A4C):   88.8 ml 35.09 ml/m LA Biplane Vol: 70.7 ml 27.94 ml/m  AORTIC VALVE LVOT Vmax:   90.60 cm/s LVOT Vmean:  59.800 cm/s LVOT VTI:    0.235 m  AORTA Ao Root diam: 3.40 cm Ao Asc diam:  3.70 cm MITRAL VALVE               TRICUSPID VALVE MV Area (PHT): 2.08 cm    TR Peak grad:  8.1 mmHg MV Decel Time: 364 msec    TR Vmax:        142.00 cm/s MV E velocity: 82.50 cm/s MV A velocity: 34.90 cm/s  SHUNTS MV E/A ratio:  2.36        Systemic VTI:  0.24 m                            Systemic Diam: 2.10 cm Chilton Si MD Electronically signed by Chilton Si MD Signature Date/Time: 12/17/2020/11:46:42 AM    Final    VAS US DOPPLER PRE CABG  Result Date: 12/18/2020 PREOPERATIVE VASCULAR EVALUATION Patient Name:  DRADEN COTTINGHAM  Date of Exam:   12/18/2020 Medical Rec #: [id number]     Accession #:    [id number] Date of Birth: 1966-07-08      Patient Gender: M Patient Age:   9Y Exam Location:  [facility] Procedure:      VAS US DOPPLER PRE CABG Referring Phys: [id number] BRYAN K BARTLE --------------------------------------------------------------------------------  Indications:      Pre-CABG. Risk Factors:     Hypertension, hyperlipidemia, Diabetes, coronary artery                   disease, prior CVA. Comparison Study: No prior study on file Performing Technologist: Sherren Kerns RVS  Examination Guidelines: A complete evaluation includes B-mode imaging, spectral Doppler, color Doppler, and power Doppler as needed of all accessible portions of each vessel. Bilateral testing is considered an integral part of a complete examination. Limited examinations for reoccurring indications may be performed as noted.  Right Carotid Findings: +----------+--------+--------+--------+------------+--------+           PSV cm/sEDV cm/sStenosisDescribe    Comments +----------+--------+--------+--------+------------+--------+ CCA Prox  111     27              heterogenous         +----------+--------+--------+--------+------------+--------+ CCA Distal67      20              heterogenous         +----------+--------+--------+--------+------------+--------+ ICA Prox  240     64      60-79%  calcific             +----------+--------+--------+--------+------------+--------+ ICA Mid   158     42                                   +----------+--------+--------+--------+------------+--------+ ICA Distal163     54                                   +----------+--------+--------+--------+------------+--------+ ECA       220     56                                   +----------+--------+--------+--------+------------+--------+ Portions of this table do not appear on this page. +----------+--------+-------+--------+------------+           PSV cm/sEDV cmsDescribeArm Pressure +----------+--------+-------+--------+------------+  Subclavian109                                 +----------+--------+-------+--------+------------+ +---------+--------+---+--------+--+ VertebralPSV cm/s149EDV cm/s38 +---------+--------+---+--------+--+ Left Carotid Findings: +----------+--------+--------+--------+--------+------------------+  PSV cm/sEDV cm/sStenosisDescribeComments           +----------+--------+--------+--------+--------+------------------+ CCA Prox  113     16                      intimal thickening +----------+--------+--------+--------+--------+------------------+ CCA Distal55      16                      intimal thickening +----------+--------+--------+--------+--------+------------------+ ICA Prox  480     151     80-99%  calcificShadowing          +----------+--------+--------+--------+--------+------------------+ ICA Mid   402     119                                        +----------+--------+--------+--------+--------+------------------+ ICA Distal122     27                                         +----------+--------+--------+--------+--------+------------------+ ECA       191     27                                         +----------+--------+--------+--------+--------+------------------+ +----------+--------+--------+--------+------------+ SubclavianPSV cm/sEDV cm/sDescribeArm Pressure +----------+--------+--------+--------+------------+           141                                  +----------+--------+--------+--------+------------+ +---------+--------+---+--------+--+ VertebralPSV cm/s150EDV cm/s15 +---------+--------+---+--------+--+  ABI Findings: +--------+------------------+-----+-----------+---------------+ Right   Rt Pressure (mmHg)IndexWaveform   Comment         +--------+------------------+-----+-----------+---------------+ Brachial                       multiphasicGlucose monitor  +--------+------------------+-----+-----------+---------------+ PTA     158               0.94 multiphasic                +--------+------------------+-----+-----------+---------------+ DP      164               0.98 multiphasic                +--------+------------------+-----+-----------+---------------+ +--------+------------------+-----+-----------+-------+ Left    Lt Pressure (mmHg)IndexWaveform   Comment +--------+------------------+-----+-----------+-------+ [id number]                    multiphasic        +--------+------------------+-----+-----------+-------+ PTA     170               1.01 multiphasic        +--------+------------------+-----+-----------+-------+ DP      167               0.99 multiphasic        +--------+------------------+-----+-----------+-------+ +-------+---------------+----------------+ ABI/TBIToday's ABI/TBIPrevious ABI/TBI +-------+---------------+----------------+ Right  0.98                            +-------+---------------+----------------+ Left   1.01                            +-------+---------------+----------------+  Right Doppler Findings: +--------+--------+-----+-----------+------------------------------------------+ Site    PressureIndexDoppler    Comments                                   +--------+--------+-----+-----------+------------------------------------------+ Brachial             multiphasicGlucose monitor                            +--------+--------+-----+-----------+------------------------------------------+ Radial               multiphasic                                           +--------+--------+-----+-----------+------------------------------------------+ Ulnar                multiphasic                                           +--------+--------+-----+-----------+------------------------------------------+ Digit                           Doppler signal remains normal  with radial                                  compression and reverses with ulnar                                        compression                                +--------+--------+-----+-----------+------------------------------------------+  Left Doppler Findings: +-----------+--------+-----+-----------+---------------------------------------+ Site       PressureIndexDoppler    Comments                                +-----------+--------+-----+-----------+---------------------------------------+ Brachial   168          multiphasic                                        +-----------+--------+-----+-----------+---------------------------------------+ Radial                  multiphasic                                        +-----------+--------+-----+-----------+---------------------------------------+ Ulnar                   multiphasic                                        +-----------+--------+-----+-----------+---------------------------------------+ Palmar Arch  Doppler signal remains normal with                                         radial compression and reverses with                                       ulnar compression                       +-----------+--------+-----+-----------+---------------------------------------+  Summary: Right Carotid: Velocities in the right ICA are consistent with a 60-79%                stenosis. Left Carotid: Velocities in the left ICA are consistent with a 80-99% stenosis. Vertebrals:  Bilateral vertebral arteries demonstrate antegrade flow. Subclavians: Normal flow hemodynamics were seen in bilateral subclavian              arteries. Right ABI: Resting right ankle-brachial index is within normal range. No evidence of significant right lower extremity arterial disease. Left ABI: Resting left ankle-brachial index is within normal range. No evidence of significant left lower extremity arterial  disease. Right Upper Extremity: Doppler waveforms remain within normal limits with right radial compression. Doppler waveforms remain within normal limits with right ulnar compression. Left Upper Extremity: Doppler waveforms remain within normal limits with left radial compression. Doppler waveforms remain within normal limits with left ulnar compression.  Electronically signed by Coral Else MD on 12/18/2020 at 4:11:38 PM.    Final

## 2020-12-20 ENCOUNTER — Inpatient Hospital Stay (HOSPITAL_COMMUNITY)
Admission: EM | Disposition: A | Discharge status: Home / Self Care | Payer: Self-pay | Source: Home / Self Care | Attending: Surgery

## 2020-12-20 ENCOUNTER — Inpatient Hospital Stay (HOSPITAL_COMMUNITY): Payer: 59

## 2020-12-20 ENCOUNTER — Inpatient Hospital Stay (HOSPITAL_COMMUNITY): Payer: 59 | Admitting: Registered Nurse

## 2020-12-20 DIAGNOSIS — Z951 Presence of aortocoronary bypass graft: Secondary | ICD-10-CM

## 2020-12-20 HISTORY — PX: CORONARY ARTERY BYPASS GRAFT: SHX141

## 2020-12-20 HISTORY — PX: TEE WITHOUT CARDIOVERSION: SHX5443

## 2020-12-20 LAB — POCT I-STAT, CHEM 8
BUN: 16 mg/dL (ref 6–20)
BUN: 16 mg/dL (ref 6–20)
BUN: 14 mg/dL (ref 6–20)
BUN: 15 mg/dL (ref 6–20)
BUN: 14 mg/dL (ref 6–20)
Calcium, Ion: 1.24 mmol/L (ref 1.15–1.40)
Calcium, Ion: 1.18 mmol/L (ref 1.15–1.40)
Calcium, Ion: 1.12 mmol/L — ABNORMAL LOW (ref 1.15–1.40)
Calcium, Ion: 1.13 mmol/L — ABNORMAL LOW (ref 1.15–1.40)
Calcium, Ion: 1.12 mmol/L — ABNORMAL LOW (ref 1.15–1.40)
Chloride: 107 mmol/L (ref 98–111)
Chloride: 107 mmol/L (ref 98–111)
Chloride: 107 mmol/L (ref 98–111)
Chloride: 107 mmol/L (ref 98–111)
Chloride: 107 mmol/L (ref 98–111)
Creatinine, Ser: 0.7 mg/dL (ref 0.61–1.24)
Creatinine, Ser: 0.7 mg/dL (ref 0.61–1.24)
Creatinine, Ser: 0.6 mg/dL — ABNORMAL LOW (ref 0.61–1.24)
Creatinine, Ser: 0.7 mg/dL (ref 0.61–1.24)
Creatinine, Ser: 0.6 mg/dL — ABNORMAL LOW (ref 0.61–1.24)
Glucose, Bld: 154 mg/dL — ABNORMAL HIGH (ref 70–99)
Glucose, Bld: 139 mg/dL — ABNORMAL HIGH (ref 70–99)
Glucose, Bld: 130 mg/dL — ABNORMAL HIGH (ref 70–99)
Glucose, Bld: 134 mg/dL — ABNORMAL HIGH (ref 70–99)
Glucose, Bld: 139 mg/dL — ABNORMAL HIGH (ref 70–99)
HCT: 33 % — ABNORMAL LOW (ref 39.0–52.0)
HCT: 32 % — ABNORMAL LOW (ref 39.0–52.0)
HCT: 29 % — ABNORMAL LOW (ref 39.0–52.0)
HCT: 30 % — ABNORMAL LOW (ref 39.0–52.0)
HCT: 28 % — ABNORMAL LOW (ref 39.0–52.0)
Hemoglobin: 11.2 g/dL — ABNORMAL LOW (ref 13.0–17.0)
Hemoglobin: 10.9 g/dL — ABNORMAL LOW (ref 13.0–17.0)
Hemoglobin: 10.2 g/dL — ABNORMAL LOW (ref 13.0–17.0)
Hemoglobin: 9.9 g/dL — ABNORMAL LOW (ref 13.0–17.0)
Hemoglobin: 9.5 g/dL — ABNORMAL LOW (ref 13.0–17.0)
Potassium: 4.1 mmol/L (ref 3.5–5.1)
Potassium: 4.4 mmol/L (ref 3.5–5.1)
Potassium: 4.7 mmol/L (ref 3.5–5.1)
Potassium: 5 mmol/L (ref 3.5–5.1)
Potassium: 4.6 mmol/L (ref 3.5–5.1)
Sodium: 140 mmol/L (ref 135–145)
Sodium: 140 mmol/L (ref 135–145)
Sodium: 140 mmol/L (ref 135–145)
Sodium: 140 mmol/L (ref 135–145)
Sodium: 141 mmol/L (ref 135–145)
TCO2: 25 mmol/L (ref 22–32)
TCO2: 25 mmol/L (ref 22–32)
TCO2: 25 mmol/L (ref 22–32)
TCO2: 26 mmol/L (ref 22–32)
TCO2: 25 mmol/L (ref 22–32)

## 2020-12-20 LAB — POCT I-STAT 7, (LYTES, BLD GAS, ICA,H+H)
Acid-Base Excess: 1 mmol/L (ref 0.0–2.0)
Acid-Base Excess: 0 mmol/L (ref 0.0–2.0)
Acid-Base Excess: 0 mmol/L (ref 0.0–2.0)
Acid-base deficit: 2 mmol/L (ref 0.0–2.0)
Acid-base deficit: 3 mmol/L — ABNORMAL HIGH (ref 0.0–2.0)
Acid-base deficit: 4 mmol/L — ABNORMAL HIGH (ref 0.0–2.0)
Bicarbonate: 26.3 mmol/L (ref 20.0–28.0)
Bicarbonate: 25.2 mmol/L (ref 20.0–28.0)
Bicarbonate: 25.2 mmol/L (ref 20.0–28.0)
Bicarbonate: 23.1 mmol/L (ref 20.0–28.0)
Bicarbonate: 21.2 mmol/L (ref 20.0–28.0)
Bicarbonate: 20.8 mmol/L (ref 20.0–28.0)
Calcium, Ion: 1.07 mmol/L — ABNORMAL LOW (ref 1.15–1.40)
Calcium, Ion: 1.12 mmol/L — ABNORMAL LOW (ref 1.15–1.40)
Calcium, Ion: 1.12 mmol/L — ABNORMAL LOW (ref 1.15–1.40)
Calcium, Ion: 1.12 mmol/L — ABNORMAL LOW (ref 1.15–1.40)
Calcium, Ion: 1.15 mmol/L (ref 1.15–1.40)
Calcium, Ion: 1.16 mmol/L (ref 1.15–1.40)
HCT: 28 % — ABNORMAL LOW (ref 39.0–52.0)
HCT: 29 % — ABNORMAL LOW (ref 39.0–52.0)
HCT: 29 % — ABNORMAL LOW (ref 39.0–52.0)
HCT: 32 % — ABNORMAL LOW (ref 39.0–52.0)
HCT: 33 % — ABNORMAL LOW (ref 39.0–52.0)
HCT: 34 % — ABNORMAL LOW (ref 39.0–52.0)
Hemoglobin: 9.5 g/dL — ABNORMAL LOW (ref 13.0–17.0)
Hemoglobin: 9.9 g/dL — ABNORMAL LOW (ref 13.0–17.0)
Hemoglobin: 9.9 g/dL — ABNORMAL LOW (ref 13.0–17.0)
Hemoglobin: 10.9 g/dL — ABNORMAL LOW (ref 13.0–17.0)
Hemoglobin: 11.2 g/dL — ABNORMAL LOW (ref 13.0–17.0)
Hemoglobin: 11.6 g/dL — ABNORMAL LOW (ref 13.0–17.0)
O2 Saturation: 100 %
O2 Saturation: 100 %
O2 Saturation: 100 %
O2 Saturation: 96 %
O2 Saturation: 99 %
O2 Saturation: 99 %
Patient temperature: 37.1
Patient temperature: 37.8
Patient temperature: 38
Potassium: 4.7 mmol/L (ref 3.5–5.1)
Potassium: 5.1 mmol/L (ref 3.5–5.1)
Potassium: 5.3 mmol/L — ABNORMAL HIGH (ref 3.5–5.1)
Potassium: 4.3 mmol/L (ref 3.5–5.1)
Potassium: 4.5 mmol/L (ref 3.5–5.1)
Potassium: 4.6 mmol/L (ref 3.5–5.1)
Sodium: 142 mmol/L (ref 135–145)
Sodium: 141 mmol/L (ref 135–145)
Sodium: 141 mmol/L (ref 135–145)
Sodium: 143 mmol/L (ref 135–145)
Sodium: 141 mmol/L (ref 135–145)
Sodium: 140 mmol/L (ref 135–145)
TCO2: 28 mmol/L (ref 22–32)
TCO2: 26 mmol/L (ref 22–32)
TCO2: 27 mmol/L (ref 22–32)
TCO2: 24 mmol/L (ref 22–32)
TCO2: 22 mmol/L (ref 22–32)
TCO2: 22 mmol/L (ref 22–32)
pCO2 arterial: 45.1 mmHg (ref 32.0–48.0)
pCO2 arterial: 40.2 mmHg (ref 32.0–48.0)
pCO2 arterial: 43.4 mmHg (ref 32.0–48.0)
pCO2 arterial: 41.7 mmHg (ref 32.0–48.0)
pCO2 arterial: 37.2 mmHg (ref 32.0–48.0)
pCO2 arterial: 37.6 mmHg (ref 32.0–48.0)
pH, Arterial: 7.373 (ref 7.350–7.450)
pH, Arterial: 7.405 (ref 7.350–7.450)
pH, Arterial: 7.372 (ref 7.350–7.450)
pH, Arterial: 7.351 (ref 7.350–7.450)
pH, Arterial: 7.367 (ref 7.350–7.450)
pH, Arterial: 7.356 (ref 7.350–7.450)
pO2, Arterial: 337 mmHg — ABNORMAL HIGH (ref 83.0–108.0)
pO2, Arterial: 334 mmHg — ABNORMAL HIGH (ref 83.0–108.0)
pO2, Arterial: 291 mmHg — ABNORMAL HIGH (ref 83.0–108.0)
pO2, Arterial: 86 mmHg (ref 83.0–108.0)
pO2, Arterial: 126 mmHg — ABNORMAL HIGH (ref 83.0–108.0)
pO2, Arterial: 134 mmHg — ABNORMAL HIGH (ref 83.0–108.0)

## 2020-12-20 LAB — BLOOD GAS, ARTERIAL
Acid-base deficit: 0.7 mmol/L (ref 0.0–2.0)
Bicarbonate: 23.2 mmol/L (ref 20.0–28.0)
Drawn by: 28340
FIO2: 21
O2 Saturation: 95.2 %
Patient temperature: 37
pCO2 arterial: 36 mmHg (ref 32.0–48.0)
pH, Arterial: 7.425 (ref 7.350–7.450)
pO2, Arterial: 75.6 mmHg — ABNORMAL LOW (ref 83.0–108.0)

## 2020-12-20 LAB — COMPREHENSIVE METABOLIC PANEL
ALT: 29 U/L (ref 0–44)
AST: 41 U/L (ref 15–41)
Albumin: 3.5 g/dL (ref 3.5–5.0)
Alkaline Phosphatase: 88 U/L (ref 38–126)
Anion gap: 10 (ref 5–15)
BUN: 15 mg/dL (ref 6–20)
CO2: 25 mmol/L (ref 22–32)
Calcium: 9 mg/dL (ref 8.9–10.3)
Chloride: 105 mmol/L (ref 98–111)
Creatinine, Ser: 0.92 mg/dL (ref 0.61–1.24)
GFR, Estimated: >60 mL/min (ref >60)
Glucose, Bld: 134 mg/dL — ABNORMAL HIGH (ref 70–99)
Potassium: 3.7 mmol/L (ref 3.5–5.1)
Sodium: 140 mmol/L (ref 135–145)
Total Bilirubin: 0.9 mg/dL (ref 0.3–1.2)
Total Protein: 6.3 g/dL — ABNORMAL LOW (ref 6.5–8.1)

## 2020-12-20 LAB — CBC
HCT: 39.9 % (ref 39.0–52.0)
HCT: 33.8 % — ABNORMAL LOW (ref 39.0–52.0)
HCT: 35.7 % — ABNORMAL LOW (ref 39.0–52.0)
Hemoglobin: 13.4 g/dL (ref 13.0–17.0)
Hemoglobin: 11 g/dL — ABNORMAL LOW (ref 13.0–17.0)
Hemoglobin: 11.9 g/dL — ABNORMAL LOW (ref 13.0–17.0)
MCH: 28.5 pg (ref 26.0–34.0)
MCH: 27.6 pg (ref 26.0–34.0)
MCH: 28.1 pg (ref 26.0–34.0)
MCHC: 33.6 g/dL (ref 30.0–36.0)
MCHC: 32.5 g/dL (ref 30.0–36.0)
MCHC: 33.3 g/dL (ref 30.0–36.0)
MCV: 84.7 fL (ref 80.0–100.0)
MCV: 84.7 fL (ref 80.0–100.0)
MCV: 84.4 fL (ref 80.0–100.0)
Platelets: 228 10*3/uL (ref 150–400)
Platelets: 138 10*3/uL — ABNORMAL LOW (ref 150–400)
Platelets: 163 10*3/uL (ref 150–400)
RBC: 4.71 MIL/uL (ref 4.22–5.81)
RBC: 3.99 MIL/uL — ABNORMAL LOW (ref 4.22–5.81)
RBC: 4.23 MIL/uL (ref 4.22–5.81)
RDW: 13.4 % (ref 11.5–15.5)
RDW: 13.2 % (ref 11.5–15.5)
RDW: 13.4 % (ref 11.5–15.5)
WBC: 9 10*3/uL (ref 4.0–10.5)
WBC: 10.4 10*3/uL (ref 4.0–10.5)
WBC: 11 10*3/uL — ABNORMAL HIGH (ref 4.0–10.5)
nRBC: 0 % (ref 0.0–0.2)
nRBC: 0 % (ref 0.0–0.2)
nRBC: 0 % (ref 0.0–0.2)

## 2020-12-20 LAB — POCT I-STAT EG7
Acid-Base Excess: 0 mmol/L (ref 0.0–2.0)
Bicarbonate: 25.9 mmol/L (ref 20.0–28.0)
Calcium, Ion: 1.13 mmol/L — ABNORMAL LOW (ref 1.15–1.40)
HCT: 28 % — ABNORMAL LOW (ref 39.0–52.0)
Hemoglobin: 9.5 g/dL — ABNORMAL LOW (ref 13.0–17.0)
O2 Saturation: 79 %
Potassium: 5.6 mmol/L — ABNORMAL HIGH (ref 3.5–5.1)
Sodium: 141 mmol/L (ref 135–145)
TCO2: 27 mmol/L (ref 22–32)
pCO2, Ven: 49 mmHg (ref 44.0–60.0)
pH, Ven: 7.33 (ref 7.250–7.430)
pO2, Ven: 47 mmHg — ABNORMAL HIGH (ref 32.0–45.0)

## 2020-12-20 LAB — HEMOGLOBIN AND HEMATOCRIT, BLOOD
HCT: 31.1 % — ABNORMAL LOW (ref 39.0–52.0)
Hemoglobin: 10.6 g/dL — ABNORMAL LOW (ref 13.0–17.0)

## 2020-12-20 LAB — ECHO INTRAOPERATIVE TEE
Height: 72 in
S' Lateral: 3.2 cm
Weight: 4797.21 oz

## 2020-12-20 LAB — GLUCOSE, CAPILLARY
Glucose-Capillary: 115 mg/dL — ABNORMAL HIGH (ref 70–99)
Glucose-Capillary: 125 mg/dL — ABNORMAL HIGH (ref 70–99)
Glucose-Capillary: 126 mg/dL — ABNORMAL HIGH (ref 70–99)
Glucose-Capillary: 128 mg/dL — ABNORMAL HIGH (ref 70–99)
Glucose-Capillary: 135 mg/dL — ABNORMAL HIGH (ref 70–99)
Glucose-Capillary: 137 mg/dL — ABNORMAL HIGH (ref 70–99)
Glucose-Capillary: 138 mg/dL — ABNORMAL HIGH (ref 70–99)
Glucose-Capillary: 147 mg/dL — ABNORMAL HIGH (ref 70–99)
Glucose-Capillary: 150 mg/dL — ABNORMAL HIGH (ref 70–99)
Glucose-Capillary: 168 mg/dL — ABNORMAL HIGH (ref 70–99)
Glucose-Capillary: 177 mg/dL — ABNORMAL HIGH (ref 70–99)
Glucose-Capillary: 197 mg/dL — ABNORMAL HIGH (ref 70–99)
Glucose-Capillary: 231 mg/dL — ABNORMAL HIGH (ref 70–99)
Glucose-Capillary: 264 mg/dL — ABNORMAL HIGH (ref 70–99)

## 2020-12-20 LAB — PROTIME-INR
INR: 1 (ref 0.8–1.2)
INR: 1.3 — ABNORMAL HIGH (ref 0.8–1.2)
Prothrombin Time: 13.5 seconds (ref 11.4–15.2)
Prothrombin Time: 16.4 seconds — ABNORMAL HIGH (ref 11.4–15.2)

## 2020-12-20 LAB — BASIC METABOLIC PANEL
Anion gap: 7 (ref 5–15)
BUN: 16 mg/dL (ref 6–20)
CO2: 21 mmol/L — ABNORMAL LOW (ref 22–32)
Calcium: 7.9 mg/dL — ABNORMAL LOW (ref 8.9–10.3)
Chloride: 110 mmol/L (ref 98–111)
Creatinine, Ser: 0.81 mg/dL (ref 0.61–1.24)
GFR, Estimated: >60 mL/min (ref >60)
Glucose, Bld: 159 mg/dL — ABNORMAL HIGH (ref 70–99)
Potassium: 4.6 mmol/L (ref 3.5–5.1)
Sodium: 138 mmol/L (ref 135–145)

## 2020-12-20 LAB — MAGNESIUM: Magnesium: 3 mg/dL — ABNORMAL HIGH (ref 1.7–2.4)

## 2020-12-20 LAB — PLATELET COUNT: Platelets: 205 10*3/uL (ref 150–400)

## 2020-12-20 LAB — HEPARIN LEVEL (UNFRACTIONATED): Heparin Unfractionated: 0.1 IU/mL — ABNORMAL LOW (ref 0.30–0.70)

## 2020-12-20 LAB — APTT
aPTT: 38 seconds — ABNORMAL HIGH (ref 24–36)
aPTT: 26 seconds (ref 24–36)

## 2020-12-20 SURGERY — CORONARY ARTERY BYPASS GRAFTING (CABG)
Anesthesia: General | Site: Chest

## 2020-12-20 MED ORDER — SODIUM CHLORIDE 0.9% FLUSH: 3.0000 mL | INTRAVENOUS | Status: DC | PRN

## 2020-12-20 MED ORDER — ACETAMINOPHEN 160 MG/5ML PO SOLN: 650.0000 mg | Freq: Once | ORAL | Status: AC

## 2020-12-20 MED ORDER — PROPOFOL 10 MG/ML IV BOLUS
INTRAVENOUS | Status: AC
  Filled 2020-12-20: qty 20

## 2020-12-20 MED ORDER — SODIUM CHLORIDE 0.9% IV SOLUTION: Freq: Once | INTRAVENOUS | Status: DC

## 2020-12-20 MED ORDER — VANCOMYCIN HCL IN DEXTROSE 1-5 GM/200ML-% IV SOLN
1000.0000 mg | Freq: Once | INTRAVENOUS | Status: AC
  Administered 2020-12-20: 1000 mg via INTRAVENOUS
  Filled 2020-12-20: qty 200

## 2020-12-20 MED ORDER — METOPROLOL TARTRATE 25 MG/10 ML ORAL SUSPENSION: 12.5000 mg | Freq: Two times a day (BID) | ORAL | Status: DC

## 2020-12-20 MED ORDER — DEXTROSE 5 % IV SOLN: INTRAVENOUS | Status: DC

## 2020-12-20 MED ORDER — HEMOSTATIC AGENTS (NO CHARGE) OPTIME
TOPICAL | Status: DC | PRN
  Administered 2020-12-20: 1 via TOPICAL

## 2020-12-20 MED ORDER — CHLORHEXIDINE GLUCONATE 0.12 % MT SOLN
15.0000 mL | OROMUCOSAL | Status: AC
  Administered 2020-12-20: 15 mL via OROMUCOSAL

## 2020-12-20 MED ORDER — ROCURONIUM BROMIDE 10 MG/ML (PF) SYRINGE
PREFILLED_SYRINGE | INTRAVENOUS | Status: AC
  Filled 2020-12-20: qty 10

## 2020-12-20 MED ORDER — DEXMEDETOMIDINE HCL IN NACL 400 MCG/100ML IV SOLN: 0.0000 ug/kg/h | INTRAVENOUS | Status: DC

## 2020-12-20 MED ORDER — HEPARIN SODIUM (PORCINE) 1000 UNIT/ML IJ SOLN
INTRAMUSCULAR | Status: AC
  Filled 2020-12-20: qty 1

## 2020-12-20 MED ORDER — HEPARIN SODIUM (PORCINE) 1000 UNIT/ML IJ SOLN
INTRAMUSCULAR | Status: DC | PRN
  Administered 2020-12-20: 41000 [IU] via INTRAVENOUS
  Administered 2020-12-20: 10000 [IU] via INTRAVENOUS
  Administered 2020-12-20: 7000 [IU] via INTRAVENOUS
  Administered 2020-12-20: 15000 [IU] via INTRAVENOUS

## 2020-12-20 MED ORDER — DEXTROSE 50 % IV SOLN: 0.0000 mL | INTRAVENOUS | Status: DC | PRN

## 2020-12-20 MED ORDER — METOPROLOL TARTRATE 12.5 MG HALF TABLET
12.5000 mg | ORAL_TABLET | Freq: Two times a day (BID) | ORAL | Status: DC
  Administered 2020-12-20 – 2020-12-21 (×3): 12.5 mg via ORAL
  Filled 2020-12-20 (×3): qty 1

## 2020-12-20 MED ORDER — MIDAZOLAM HCL 5 MG/5ML IJ SOLN
INTRAMUSCULAR | Status: DC | PRN
  Administered 2020-12-20: 3 mg via INTRAVENOUS
  Administered 2020-12-20 (×2): 1 mg via INTRAVENOUS
  Administered 2020-12-20: 3 mg via INTRAVENOUS
  Administered 2020-12-20: 2 mg via INTRAVENOUS

## 2020-12-20 MED ORDER — LACTATED RINGERS IV SOLN: INTRAVENOUS | Status: DC

## 2020-12-20 MED ORDER — BISACODYL 10 MG RE SUPP: 10.0000 mg | Freq: Every day | RECTAL | Status: DC

## 2020-12-20 MED ORDER — SODIUM CHLORIDE 0.9% FLUSH
10.0000 mL | Freq: Two times a day (BID) | INTRAVENOUS | Status: DC
Start: 2020-12-20 — End: 2020-12-22
  Administered 2020-12-21 (×2): 10 mL

## 2020-12-20 MED ORDER — 0.9 % SODIUM CHLORIDE (POUR BTL) OPTIME
TOPICAL | Status: DC | PRN
  Administered 2020-12-20: 5000 mL

## 2020-12-20 MED ORDER — ONDANSETRON HCL 4 MG/2ML IJ SOLN: 4.0000 mg | Freq: Four times a day (QID) | INTRAMUSCULAR | Status: DC | PRN

## 2020-12-20 MED ORDER — SODIUM CHLORIDE 0.9 % IV SOLN: INTRAVENOUS | Status: DC

## 2020-12-20 MED ORDER — PLASMA-LYTE 148 IV SOLN
INTRAVENOUS | Status: DC | PRN
  Administered 2020-12-20: 500 mL via INTRAVASCULAR

## 2020-12-20 MED ORDER — THROMBIN 20000 UNITS EX SOLR
OROMUCOSAL | Status: DC | PRN
  Administered 2020-12-20 (×3): 4 mL via TOPICAL

## 2020-12-20 MED ORDER — LACTATED RINGERS IV SOLN: INTRAVENOUS | Status: DC | PRN

## 2020-12-20 MED ORDER — SODIUM CHLORIDE (PF) 0.9 % IJ SOLN
INTRAMUSCULAR | Status: AC
  Filled 2020-12-20: qty 10

## 2020-12-20 MED ORDER — SODIUM CHLORIDE 0.9% FLUSH: 10.0000 mL | INTRAVENOUS | Status: DC | PRN

## 2020-12-20 MED ORDER — TRAMADOL HCL 50 MG PO TABS
50.0000 mg | ORAL_TABLET | ORAL | Status: DC | PRN
  Administered 2020-12-21 (×4): 100 mg via ORAL
  Filled 2020-12-20 (×4): qty 2

## 2020-12-20 MED ORDER — POTASSIUM CHLORIDE 10 MEQ/50ML IV SOLN: 10.0000 meq | INTRAVENOUS | Status: AC

## 2020-12-20 MED ORDER — FAMOTIDINE IN NACL 20-0.9 MG/50ML-% IV SOLN
20.0000 mg | Freq: Two times a day (BID) | INTRAVENOUS | Status: AC
  Administered 2020-12-20 (×2): 20 mg via INTRAVENOUS
  Filled 2020-12-20 (×2): qty 50

## 2020-12-20 MED ORDER — ALBUMIN HUMAN 5 % IV SOLN: 250.0000 mL | INTRAVENOUS | Status: DC | PRN

## 2020-12-20 MED ORDER — NITROGLYCERIN IN D5W 200-5 MCG/ML-% IV SOLN: 0.0000 ug/min | INTRAVENOUS | Status: DC

## 2020-12-20 MED ORDER — CHLORHEXIDINE GLUCONATE CLOTH 2 % EX PADS
6.0000 | MEDICATED_PAD | Freq: Every day | CUTANEOUS | Status: DC
  Administered 2020-12-20 – 2020-12-25 (×6): 6 via TOPICAL

## 2020-12-20 MED ORDER — ACETAMINOPHEN 500 MG PO TABS
1000.0000 mg | ORAL_TABLET | Freq: Four times a day (QID) | ORAL | Status: DC
  Administered 2020-12-20 – 2020-12-22 (×6): 1000 mg via ORAL
  Filled 2020-12-20 (×6): qty 2

## 2020-12-20 MED ORDER — ACETAMINOPHEN 650 MG RE SUPP
650.0000 mg | Freq: Once | RECTAL | Status: AC
  Administered 2020-12-20: 650 mg via RECTAL

## 2020-12-20 MED ORDER — ASPIRIN EC 325 MG PO TBEC
325.0000 mg | DELAYED_RELEASE_TABLET | Freq: Every day | ORAL | Status: DC
  Administered 2020-12-21: 325 mg via ORAL
  Filled 2020-12-20: qty 1

## 2020-12-20 MED ORDER — THROMBIN 20000 UNITS EX SOLR
CUTANEOUS | Status: DC | PRN
  Administered 2020-12-20: 20000 [IU] via TOPICAL

## 2020-12-20 MED ORDER — OXYCODONE HCL 5 MG PO TABS
5.0000 mg | ORAL_TABLET | ORAL | Status: DC | PRN
  Administered 2020-12-20 – 2020-12-21 (×2): 10 mg via ORAL
  Administered 2020-12-21 (×2): 5 mg via ORAL
  Administered 2020-12-21 – 2020-12-22 (×3): 10 mg via ORAL
  Filled 2020-12-20 (×2): qty 2
  Filled 2020-12-20: qty 1
  Filled 2020-12-20: qty 2
  Filled 2020-12-20: qty 1
  Filled 2020-12-20 (×2): qty 2

## 2020-12-20 MED ORDER — ROCURONIUM BROMIDE 10 MG/ML (PF) SYRINGE
PREFILLED_SYRINGE | INTRAVENOUS | Status: AC
  Filled 2020-12-20: qty 20

## 2020-12-20 MED ORDER — DOCUSATE SODIUM 100 MG PO CAPS
200.0000 mg | ORAL_CAPSULE | Freq: Every day | ORAL | Status: DC
  Administered 2020-12-21: 200 mg via ORAL
  Filled 2020-12-20: qty 2

## 2020-12-20 MED ORDER — DEXTROSE 5 % IV SOLN: INTRAVENOUS | Status: AC

## 2020-12-20 MED ORDER — INSULIN REGULAR(HUMAN) IN NACL 100-0.9 UT/100ML-% IV SOLN: INTRAVENOUS | Status: DC

## 2020-12-20 MED ORDER — THROMBIN 20000 UNITS EX SOLR: CUTANEOUS | Status: DC | PRN

## 2020-12-20 MED ORDER — DOPAMINE-DEXTROSE 3.2-5 MG/ML-% IV SOLN
INTRAVENOUS | Status: DC | PRN
  Administered 2020-12-20: 3 ug/kg/min via INTRAVENOUS

## 2020-12-20 MED ORDER — LACTATED RINGERS IV SOLN
500.0000 mL | Freq: Once | INTRAVENOUS | Status: AC | PRN
  Administered 2020-12-21: 500 mL via INTRAVENOUS

## 2020-12-20 MED ORDER — SODIUM CHLORIDE 0.45 % IV SOLN: INTRAVENOUS | Status: DC | PRN

## 2020-12-20 MED ORDER — PROPOFOL 10 MG/ML IV BOLUS
INTRAVENOUS | Status: DC | PRN
  Administered 2020-12-20: 20 mg via INTRAVENOUS
  Administered 2020-12-20: 80 mg via INTRAVENOUS

## 2020-12-20 MED ORDER — SODIUM CHLORIDE 0.9% FLUSH
3.0000 mL | Freq: Two times a day (BID) | INTRAVENOUS | Status: DC
  Administered 2020-12-21 (×2): 3 mL via INTRAVENOUS

## 2020-12-20 MED ORDER — THROMBIN (RECOMBINANT) 20000 UNITS EX SOLR
CUTANEOUS | Status: AC
  Filled 2020-12-20: qty 20000

## 2020-12-20 MED ORDER — SODIUM CHLORIDE 0.9 % IV SOLN
250.0000 mL | INTRAVENOUS | Status: DC
  Administered 2020-12-21: 250 mL via INTRAVENOUS

## 2020-12-20 MED ORDER — METOPROLOL TARTRATE 5 MG/5ML IV SOLN: 2.5000 mg | INTRAVENOUS | Status: DC | PRN

## 2020-12-20 MED ORDER — MORPHINE SULFATE (PF) 2 MG/ML IV SOLN
1.0000 mg | INTRAVENOUS | Status: DC | PRN
  Administered 2020-12-20 – 2020-12-21 (×2): 2 mg via INTRAVENOUS
  Administered 2020-12-21: 4 mg via INTRAVENOUS
  Administered 2020-12-21 (×5): 2 mg via INTRAVENOUS
  Administered 2020-12-21: 4 mg via INTRAVENOUS
  Filled 2020-12-20: qty 2
  Filled 2020-12-20: qty 1
  Filled 2020-12-20: qty 2
  Filled 2020-12-20 (×3): qty 1
  Filled 2020-12-20: qty 2
  Filled 2020-12-20: qty 1

## 2020-12-20 MED ORDER — ASPIRIN 81 MG PO CHEW: 324.0000 mg | CHEWABLE_TABLET | Freq: Every day | ORAL | Status: DC

## 2020-12-20 MED ORDER — ACETAMINOPHEN 160 MG/5ML PO SOLN: 1000.0000 mg | Freq: Four times a day (QID) | ORAL | Status: DC

## 2020-12-20 MED ORDER — FENTANYL CITRATE (PF) 250 MCG/5ML IJ SOLN
INTRAMUSCULAR | Status: AC
  Filled 2020-12-20: qty 25

## 2020-12-20 MED ORDER — ALBUMIN HUMAN 5 % IV SOLN: INTRAVENOUS | Status: DC | PRN

## 2020-12-20 MED ORDER — MAGNESIUM SULFATE 4 GM/100ML IV SOLN
4.0000 g | Freq: Once | INTRAVENOUS | Status: AC
  Administered 2020-12-20: 4 g via INTRAVENOUS
  Filled 2020-12-20: qty 100

## 2020-12-20 MED ORDER — BISACODYL 5 MG PO TBEC
10.0000 mg | DELAYED_RELEASE_TABLET | Freq: Every day | ORAL | Status: DC
  Administered 2020-12-21: 10 mg via ORAL
  Filled 2020-12-20: qty 2

## 2020-12-20 MED ORDER — ESMOLOL HCL 100 MG/10ML IV SOLN
INTRAVENOUS | Status: DC | PRN
  Administered 2020-12-20: 10 mg via INTRAVENOUS
  Administered 2020-12-20: 30 mg via INTRAVENOUS

## 2020-12-20 MED ORDER — FENTANYL CITRATE (PF) 250 MCG/5ML IJ SOLN
INTRAMUSCULAR | Status: DC | PRN
  Administered 2020-12-20: 50 ug via INTRAVENOUS
  Administered 2020-12-20: 650 ug via INTRAVENOUS
  Administered 2020-12-20: 100 ug via INTRAVENOUS
  Administered 2020-12-20: 150 ug via INTRAVENOUS
  Administered 2020-12-20 (×2): 100 ug via INTRAVENOUS
  Administered 2020-12-20 (×2): 50 ug via INTRAVENOUS

## 2020-12-20 MED ORDER — ROCURONIUM BROMIDE 10 MG/ML (PF) SYRINGE
PREFILLED_SYRINGE | INTRAVENOUS | Status: DC | PRN
  Administered 2020-12-20 (×2): 50 mg via INTRAVENOUS
  Administered 2020-12-20: 100 mg via INTRAVENOUS
  Administered 2020-12-20 (×2): 50 mg via INTRAVENOUS

## 2020-12-20 MED ORDER — MIDAZOLAM HCL 2 MG/2ML IJ SOLN: 2.0000 mg | INTRAMUSCULAR | Status: DC | PRN

## 2020-12-20 MED ORDER — ANTITHROMBIN III (HUMAN) 500 UNITS IV SOLR
INTRAVENOUS | Status: DC | PRN
  Administered 2020-12-20: 500 [IU] via INTRAVENOUS

## 2020-12-20 MED ORDER — PROTAMINE SULFATE 10 MG/ML IV SOLN
INTRAVENOUS | Status: DC | PRN
  Administered 2020-12-20: 500 mg via INTRAVENOUS

## 2020-12-20 MED ORDER — PHENYLEPHRINE HCL-NACL 20-0.9 MG/250ML-% IV SOLN: 0.0000 ug/min | INTRAVENOUS | Status: DC

## 2020-12-20 MED ORDER — ESMOLOL HCL 100 MG/10ML IV SOLN
INTRAVENOUS | Status: AC
  Filled 2020-12-20: qty 10

## 2020-12-20 MED ORDER — SODIUM CHLORIDE 0.9 % IV SOLN
1.5000 g | Freq: Two times a day (BID) | INTRAVENOUS | Status: DC
  Administered 2020-12-20 – 2020-12-21 (×2): 1.5 g via INTRAVENOUS
  Filled 2020-12-20 (×4): qty 1.5

## 2020-12-20 MED ORDER — PANTOPRAZOLE SODIUM 40 MG PO TBEC: 40.0000 mg | DELAYED_RELEASE_TABLET | Freq: Every day | ORAL | Status: DC

## 2020-12-20 MED ORDER — MIDAZOLAM HCL (PF) 10 MG/2ML IJ SOLN
INTRAMUSCULAR | Status: AC
  Filled 2020-12-20: qty 2

## 2020-12-20 MED ORDER — HEPARIN SODIUM (PORCINE) 1000 UNIT/ML IJ SOLN
INTRAMUSCULAR | Status: AC
  Filled 2020-12-20: qty 4

## 2020-12-20 SURGICAL SUPPLY — 101 items
BAG DECANTER FOR FLEXI CONT (MISCELLANEOUS) ×3 IMPLANT
BLADE CLIPPER SURG (BLADE) ×3 IMPLANT
BLADE STERNUM SYSTEM 6 (BLADE) ×3 IMPLANT
BNDG ELASTIC 4X5.8 VLCR STR LF (GAUZE/BANDAGES/DRESSINGS) ×3 IMPLANT
BNDG ELASTIC 6X10 VLCR STRL LF (GAUZE/BANDAGES/DRESSINGS) ×1 IMPLANT
BNDG ELASTIC 6X5.8 VLCR STR LF (GAUZE/BANDAGES/DRESSINGS) ×3 IMPLANT
BNDG GAUZE ELAST 4 BULKY (GAUZE/BANDAGES/DRESSINGS) ×3 IMPLANT
CANISTER SUCT 3000ML PPV (MISCELLANEOUS) ×3 IMPLANT
CANNULA EZ GLIDE 8.0 24FR (CANNULA) ×1 IMPLANT
CATH ROBINSON RED A/P 18FR (CATHETERS) ×6 IMPLANT
CATH THORACIC 28FR (CATHETERS) ×3 IMPLANT
CATH THORACIC 36FR (CATHETERS) ×3 IMPLANT
CATH THORACIC 36FR RT ANG (CATHETERS) ×3 IMPLANT
CLIP VESOCCLUDE MED 24/CT (CLIP) IMPLANT
CLIP VESOCCLUDE SM WIDE 24/CT (CLIP) IMPLANT
CONN 1/2X1/2X1/2  BEN (MISCELLANEOUS)
CONN 1/2X1/2X1/2 BEN (MISCELLANEOUS) IMPLANT
CONN 3/8X1/2 ST GISH (MISCELLANEOUS) ×2 IMPLANT
CONTAINER PROTECT SURGISLUSH (MISCELLANEOUS) ×4 IMPLANT
DERMABOND ADVANCED (GAUZE/BANDAGES/DRESSINGS) ×1
DERMABOND ADVANCED .7 DNX12 (GAUZE/BANDAGES/DRESSINGS) IMPLANT
DRAPE CARDIOVASCULAR INCISE (DRAPES) ×1
DRAPE SRG 135X102X78XABS (DRAPES) ×2 IMPLANT
DRAPE WARM FLUID 44X44 (DRAPES) ×3 IMPLANT
DRSG COVADERM 4X14 (GAUZE/BANDAGES/DRESSINGS) ×3 IMPLANT
ELECT BLADE 4.0 EZ CLEAN MEGAD (MISCELLANEOUS) ×3
ELECT CAUTERY BLADE 6.4 (BLADE) ×3 IMPLANT
ELECT REM PT RETURN 9FT ADLT (ELECTROSURGICAL) ×6
ELECTRODE BLDE 4.0 EZ CLN MEGD (MISCELLANEOUS) IMPLANT
ELECTRODE REM PT RTRN 9FT ADLT (ELECTROSURGICAL) ×4 IMPLANT
FELT TEFLON 1X6 (MISCELLANEOUS) ×5 IMPLANT
GAUZE SPONGE 4X4 12PLY STRL (GAUZE/BANDAGES/DRESSINGS) ×6 IMPLANT
GLOVE SURG MICRO LTX SZ6.5 (GLOVE) ×5 IMPLANT
GLOVE SURG MICRO LTX SZ7 (GLOVE) ×8 IMPLANT
GLOVE SURG SYN 7.5  E (GLOVE) ×2
GLOVE SURG SYN 7.5 E (GLOVE) ×4 IMPLANT
GLOVE SURG SYN 7.5 PF PI (GLOVE) IMPLANT
GLOVE SURG UNDER POLY LF SZ6.5 (GLOVE) IMPLANT
GLOVE SURG UNDER POLY LF SZ7 (GLOVE) IMPLANT
GOWN STRL REUS W/ TWL LRG LVL3 (GOWN DISPOSABLE) ×8 IMPLANT
GOWN STRL REUS W/ TWL XL LVL3 (GOWN DISPOSABLE) ×2 IMPLANT
GOWN STRL REUS W/TWL LRG LVL3 (GOWN DISPOSABLE) ×4
GOWN STRL REUS W/TWL XL LVL3 (GOWN DISPOSABLE) ×2 IMPLANT
HEMOSTAT POWDER SURGIFOAM 1G (HEMOSTASIS) ×9 IMPLANT
HEMOSTAT SURGICEL 2X14 (HEMOSTASIS) ×3 IMPLANT
INSERT FOGARTY 61MM (MISCELLANEOUS) IMPLANT
INSERT FOGARTY XLG (MISCELLANEOUS) IMPLANT
KIT BASIN OR (CUSTOM PROCEDURE TRAY) ×3 IMPLANT
KIT CATH CPB BARTLE (MISCELLANEOUS) ×3 IMPLANT
KIT SUCTION CATH 14FR (SUCTIONS) ×3 IMPLANT
KIT TURNOVER KIT B (KITS) ×3 IMPLANT
KIT VASOVIEW HEMOPRO 2 VH 4000 (KITS) ×3 IMPLANT
LOOP VESSEL SUPERMAXI WHITE (MISCELLANEOUS) IMPLANT
NS IRRIG 1000ML POUR BTL (IV SOLUTION) ×15 IMPLANT
PACK E OPEN HEART (SUTURE) ×3 IMPLANT
PACK OPEN HEART (CUSTOM PROCEDURE TRAY) ×3 IMPLANT
PAD ARMBOARD 7.5X6 YLW CONV (MISCELLANEOUS) ×6 IMPLANT
PAD ELECT DEFIB RADIOL ZOLL (MISCELLANEOUS) ×3 IMPLANT
PENCIL BUTTON HOLSTER BLD 10FT (ELECTRODE) ×3 IMPLANT
POSITIONER HEAD DONUT 9IN (MISCELLANEOUS) ×3 IMPLANT
PUNCH AORTIC ROTATE 4.5MM 8IN (MISCELLANEOUS) ×3 IMPLANT
SET CARDIOPLEGIA MPS 5001102 (MISCELLANEOUS) ×1 IMPLANT
SPONGE INTESTINAL PEANUT (DISPOSABLE) IMPLANT
SPONGE LAP 18X18 RF (DISPOSABLE) ×1 IMPLANT
SPONGE LAP 4X18 RFD (DISPOSABLE) ×3 IMPLANT
SUPPORT HEART JANKE-BARRON (MISCELLANEOUS) ×3 IMPLANT
SUT BONE WAX W31G (SUTURE) ×3 IMPLANT
SUT MNCRL AB 4-0 PS2 18 (SUTURE) IMPLANT
SUT PROLENE 3 0 SH DA (SUTURE) IMPLANT
SUT PROLENE 3 0 SH1 36 (SUTURE) ×4 IMPLANT
SUT PROLENE 4 0 RB 1 (SUTURE) ×2
SUT PROLENE 4 0 SH DA (SUTURE) IMPLANT
SUT PROLENE 4-0 RB1 .5 CRCL 36 (SUTURE) IMPLANT
SUT PROLENE 5 0 C 1 36 (SUTURE) IMPLANT
SUT PROLENE 6 0 C 1 30 (SUTURE) ×2 IMPLANT
SUT PROLENE 7 0 BV 1 (SUTURE) ×2 IMPLANT
SUT PROLENE 7 0 BV1 MDA (SUTURE) ×3 IMPLANT
SUT PROLENE 8 0 BV175 6 (SUTURE) IMPLANT
SUT SILK  1 MH (SUTURE)
SUT SILK 1 MH (SUTURE) IMPLANT
SUT SILK 2 0 SH (SUTURE) IMPLANT
SUT STEEL STERNAL CCS#1 18IN (SUTURE) ×1 IMPLANT
SUT STEEL SZ 6 DBL 3X14 BALL (SUTURE) ×3 IMPLANT
SUT VIC AB 1 CTX 36 (SUTURE) ×3
SUT VIC AB 1 CTX36XBRD ANBCTR (SUTURE) ×4 IMPLANT
SUT VIC AB 2-0 CT1 27 (SUTURE) ×1
SUT VIC AB 2-0 CT1 TAPERPNT 27 (SUTURE) IMPLANT
SUT VIC AB 2-0 CTX 27 (SUTURE) IMPLANT
SUT VIC AB 3-0 SH 27 (SUTURE)
SUT VIC AB 3-0 SH 27X BRD (SUTURE) IMPLANT
SUT VIC AB 3-0 X1 27 (SUTURE) ×1 IMPLANT
SUT VICRYL 4-0 PS2 18IN ABS (SUTURE) IMPLANT
SYSTEM SAHARA CHEST DRAIN ATS (WOUND CARE) ×3 IMPLANT
TAPE CLOTH 4X10 WHT NS (GAUZE/BANDAGES/DRESSINGS) ×1 IMPLANT
TAPE PAPER 2X10 WHT MICROPORE (GAUZE/BANDAGES/DRESSINGS) ×1 IMPLANT
TOWEL GREEN STERILE (TOWEL DISPOSABLE) ×3 IMPLANT
TOWEL GREEN STERILE FF (TOWEL DISPOSABLE) ×3 IMPLANT
TRAY FOLEY SLVR 16FR TEMP STAT (SET/KITS/TRAYS/PACK) ×3 IMPLANT
TUBING LAP HI FLOW INSUFFLATIO (TUBING) ×3 IMPLANT
UNDERPAD 30X36 HEAVY ABSORB (UNDERPADS AND DIAPERS) ×3 IMPLANT
WATER STERILE IRR 1000ML POUR (IV SOLUTION) ×6 IMPLANT

## 2020-12-20 NOTE — Progress Notes (Signed)
[facility] BRIEF SICU PROGRESS NOTE  Day of Surgery  S/P Procedure(s) (LRB): CORONARY ARTERY BYPASS GRAFTING (CABG) x FOUR, USING LEFT INTERNAL MAMMARY ARTERY AND RIGHT LEG GREATER SAPHENOUS VEIN HARVESTED ENDOSCOPICALLY (N/A) TRANSESOPHAGEAL ECHOCARDIOGRAM (TEE) (N/A)   Waking up on vent NSR w/ stable hemodynamics O2 sats 100% Chest tube output low UOP 40-45 mL/hr Labs okay  Plan: Continue routine early postop  Purcell Nails, MD 12/20/2020 6:26 PM

## 2020-12-20 NOTE — Interval H&P Note (Signed)
History and Physical Interval Note:  12/20/2020 7:12 AM  Timothy Golden  has presented today for surgery, with the diagnosis of CAD.  The various methods of treatment have been discussed with the patient and family. After consideration of risks, benefits and other options for treatment, the patient has consented to  Procedure(s): CORONARY ARTERY BYPASS GRAFTING (CABG) (N/A) TRANSESOPHAGEAL ECHOCARDIOGRAM (TEE) (N/A) as a surgical intervention.  The patient's history has been reviewed, patient examined, no change in status, stable for surgery.  I have reviewed the patient's chart and labs.  Questions were answered to the patient's satisfaction.     Alleen Borne

## 2020-12-20 NOTE — Progress Notes (Signed)
Report called to Lillette Boxer. Handoff given and reported Pre op metoprolol NOT GIVEN due to HR in the 50's. Dierdre Highman, RN

## 2020-12-20 NOTE — Op Note (Signed)
CARDIOVASCULAR SURGERY OPERATIVE NOTE  12/20/2020  Surgeon:  Alleen Borne, MD  First Assistant: Jillyn Hidden,  PA-C   Preoperative Diagnosis:  Severe multi-vessel coronary artery disease   Postoperative Diagnosis:  Same   Procedure:  1. Median Sternotomy 2. Extracorporeal circulation 3.   Coronary artery bypass grafting x 4   Left internal mammary artery graft to the LAD  SVG to diagonal  SVG to Ramus  SVG to OM 4.   Endoscopic vein harvest from the right leg   Anesthesia:  General Endotracheal   Clinical History/Surgical Indication:  This 55 year old gentleman with type 1 diabetes and dyslipidemia presents with a non-ST segment elevation MI.  Cardiac catheterization shows the LAD to be occluded at the ostium with faint right to left collaterals.  The distal left main has about 60% stenosis and divides into a large ramus branch that has 75% ostial stenosis and a left circumflex that has 60% ostial stenosis before a large marginal branch.  Left ventricular ejection fraction is visually 35 to 45% with mildly elevated LVEDP of 18.  I agree that coronary artery bypass graft surgery is the best treatment for this patient. I discussed the operative procedure with the patient and his wife including alternatives, benefits and risks; including but not limited to bleeding, blood transfusion, infection, stroke, myocardial infarction, graft failure, heart block requiring a permanent pacemaker, organ dysfunction, and death.  Timothy Golden understands and agrees to proceed.   Preparation:  The patient was seen in the preoperative holding area and the correct patient, correct operation were confirmed with the patient after reviewing the medical record and catheterization. The consent was signed by me. Preoperative antibiotics were given. A pulmonary arterial line and radial arterial line were  placed by the anesthesia team. The patient was taken back to the operating room and positioned supine on the operating room table. After being placed under general endotracheal anesthesia by the anesthesia team a foley catheter was placed. The neck, chest, abdomen, and both legs were prepped with betadine soap and solution and draped in the usual sterile manner. A surgical time-out was taken and the correct patient and operative procedure were confirmed with the nursing and anesthesia staff.   Cardiopulmonary Bypass:  A median sternotomy was performed. The pericardium was opened in the midline. Right ventricular function appeared normal. The ascending aorta was of normal size and had no palpable plaque. There were no contraindications to aortic cannulation or cross-clamping. The patient was given 48,000 units of heparin and the ACT was only 360. He was given an additional 10,000 units and the ACT did not change. Therefore he was given 500 units of ATlll. The ACT went up slightly to 380's. He was given an additional 75643 and the ACT went up over 400. The proximal aortic arch was cannulated with an 8 mm aortic cannula for arterial inflow. Venous cannulation was performed via the right atrial appendage using a two-staged venous cannula. An antegrade cardioplegia/vent cannula was inserted into the mid-ascending aorta. Aortic occlusion was performed with a single cross-clamp. Systemic cooling to 32 degrees Centigrade and topical cooling of the heart with iced saline were used. Hyperkalemic antegrade cold blood cardioplegia was used to induce diastolic arrest and was then given at about 20 minute intervals throughout the period of arrest to maintain myocardial temperature at or below 10 degrees centigrade. A temperature probe was inserted into the interventricular septum and an insulating pad was placed in the pericardium.   Left internal mammary artery harvest:  The left side of the sternum was retracted using  the Rultract retractor. The left internal mammary artery was harvested as a pedicle graft. All side branches were clipped. It was a large-sized vessel of good quality with excellent blood flow. It was ligated distally and divided. It was sprayed with topical papaverine solution to prevent vasospasm.   Endoscopic vein harvest:  The right greater saphenous vein was harvested endoscopically through a 2 cm incision medial to the right knee. It was harvested from the upper thigh to below the knee. It was a medium-sized vein of good quality. The side branches were all ligated with 4-0 silk ties.    Coronary arteries:  The coronary arteries were examined.   LAD:  Diffusely diseased. It has moderate sized D1 and D2 branches. The D2 branch supplied a large territory.  LCX:  Moderate sized Ramus and OM branches. Both were visible very proximally and then became deep intramyocardial. They were grafted proximally.   RCA:  No disease on cath but diffuse segmental plaque palpable in the vessel.   Grafts:  1. LIMA to the LAD: 1.6 mm distally. It was sewn end to side using 8-0 prolene continuous suture. 2. SVG to D2:  1.6 mm. It was sewn end to side using 7-0 prolene continuous suture. 3. SVG to Ramus:  1.75 mm. It was sewn end to side using 7-0 prolene continuous suture. 4. SVG to OM:  1.75 mm. It was sewn end to side using 7-0 prolene continuous suture.  The proximal vein graft anastomoses of the Ramus and OM grafts were performed to the mid-ascending aorta using continuous 6-0 prolene suture. The proximal anastomosis of the diagonal graft was performed to the proximal portion of the Ramus graft in end to side manner using continuous 7-0 prolene suture. Graft markers were placed around the proximal anastomoses.   Completion:  The patient was rewarmed to 37 degrees Centigrade. The clamp was removed from the LIMA pedicle and there was rapid warming of the septum and return of ventricular  fibrillation. The crossclamp was removed with a time of 107 minutes. There was spontaneous return of sinus rhythm. The distal and proximal anastomoses were checked for hemostasis. The position of the grafts was satisfactory. Two temporary epicardial pacing wires were placed on the right atrium and two on the right ventricle. The patient was weaned from CPB without difficulty on no inotropes. CPB time was 130 minutes. Cardiac output was 5 LPM. TEE showed normal LV systolic function.  Heparin was fully reversed with protamine and the aortic and venous cannulas removed. Hemostasis was achieved. Mediastinal and left pleural drainage tubes were placed. The sternum was closed with double #6 stainless steel wires. The fascia was closed with continuous # 1 vicryl suture. The subcutaneous tissue was closed with 2-0 vicryl continuous suture. The skin was closed with 3-0 vicryl subcuticular suture. All sponge, needle, and instrument counts were reported correct at the end of the case. Dry sterile dressings were placed over the incisions and around the chest tubes which were connected to pleurevac suction. The patient was then transported to the surgical intensive care unit in stable condition.

## 2020-12-20 NOTE — Progress Notes (Addendum)
Patient to OR early am and post op in ICU.  DM management - on Insulin drip per protocol CBGs stable, gentle D5w drip while not on feeds to avoid hypoglycemia, will DW Dr Laneta Simmers , he does not need TRH in ICU.  CBG (last 3)  Recent Labs    12/20/20 0441 12/20/20 0549 12/20/20 1503  GLUCAP 115* 135* 126*

## 2020-12-20 NOTE — Anesthesia Procedure Notes (Signed)
Arterial Line Insertion Start/End5/09/2020 7:15 AM Performed by: Drema Pry, CRNA, CRNA  Patient location: Pre-op. Preanesthetic checklist: patient identified, IV checked, site marked, risks and benefits discussed, surgical consent, monitors and equipment checked, pre-op evaluation, timeout performed and anesthesia consent Lidocaine 1% used for infiltration and patient sedated Left, radial was placed Catheter size: 20 G Hand hygiene performed  and maximum sterile barriers used   Attempts: 2 Procedure performed without using ultrasound guided technique. Following insertion, dressing applied and Biopatch. Post procedure assessment: normal  Patient tolerated the procedure well with no immediate complications.

## 2020-12-20 NOTE — Progress Notes (Signed)
PT complains of mild chest tightness-mid sternal. BP 129/56 HR 58. Pt is first case CABG this am and due to leave for or at 6am. Nitro gtt increased to 68mcg/hr. Pt also anxious and received Valium 5mg  per order at 508 am. Will continue to monitor. , RN

## 2020-12-20 NOTE — Transfer of Care (Signed)
Immediate Anesthesia Transfer of Care Note  Patient: Timothy Golden  Procedure(s) Performed: CORONARY ARTERY BYPASS GRAFTING (CABG) x FOUR, USING LEFT INTERNAL MAMMARY ARTERY AND RIGHT LEG GREATER SAPHENOUS VEIN HARVESTED ENDOSCOPICALLY (N/A Chest) TRANSESOPHAGEAL ECHOCARDIOGRAM (TEE) (N/A )  Patient Location: PACU and ICU  Anesthesia Type:General  Level of Consciousness: sedated and Patient remains intubated per anesthesia plan  Airway & Oxygen Therapy: Patient remains intubated per anesthesia plan and Patient placed on Ventilator (see vital sign flow sheet for setting)  Post-op Assessment: Report given to RN and Post -op Vital signs reviewed and stable  Post vital signs: Reviewed and stable  Last Vitals:  Vitals Value Taken Time  BP    Temp 37 C 12/20/20 1458  Pulse 84 12/20/20 1458  Resp 24 12/20/20 1458  SpO2 98 % 12/20/20 1458  Vitals shown include unvalidated device data.  Last Pain:  Vitals:   12/20/20 0500  TempSrc: Oral  PainSc:       Patients Stated Pain Goal: 0 (12/18/20 2200)  Complications: No complications documented.

## 2020-12-20 NOTE — Anesthesia Procedure Notes (Signed)
Central Venous Catheter Insertion Performed by: Annye Asa, MD, anesthesiologist Start/End5/09/2020 6:49 AM, 12/20/2020 7:01 AM Patient location: Pre-op. Preanesthetic checklist: patient identified, IV checked, risks and benefits discussed, surgical consent, monitors and equipment checked, pre-op evaluation, timeout performed and anesthesia consent Position: Trendelenburg Lidocaine 1% used for infiltration and patient sedated Hand hygiene performed , maximum sterile barriers used  and Seldinger technique used Catheter size: 8 Fr PA cath was placed.Sheath introducer Swan type:thermodilution Procedure performed using ultrasound guided technique. Ultrasound Notes:anatomy identified, needle tip was noted to be adjacent to the nerve/plexus identified, no ultrasound evidence of intravascular and/or intraneural injection and image(s) printed for medical record Attempts: 1 Following insertion, line sutured, dressing applied and Biopatch. Post procedure assessment: blood return through all ports, free fluid flow and no air  Patient tolerated the procedure well with no immediate complications. Additional procedure comments: PA catheter:  Routine monitors. Timeout, sterile prep, drape, FBP R neck.  Trendelenburg position.  1% Lido local, finder and trocar RIJ 1st pass with US guidance.  Cordis placed over J wire. PA catheter in easily.  Sterile dressing applied.  Patient tolerated well, VSS.  Jenita Seashore, MD.

## 2020-12-20 NOTE — Anesthesia Procedure Notes (Addendum)
Procedure Name: Intubation Date/Time: 12/20/2020 7:54 AM Performed by: Trinna Post., CRNA Pre-anesthesia Checklist: Patient identified, Emergency Drugs available, Suction available, Patient being monitored and Timeout performed Patient Re-evaluated:Patient Re-evaluated prior to induction Oxygen Delivery Method: Circle system utilized Preoxygenation: Pre-oxygenation with 100% oxygen Induction Type: IV induction Ventilation: Two handed mask ventilation required and Oral airway inserted - appropriate to patient size Laryngoscope Size: Mac and 4 Grade View: Grade II Tube type: Oral Tube size: 8.0 mm Number of attempts: 1 Airway Equipment and Method: Stylet Placement Confirmation: ETT inserted through vocal cords under direct vision,  positive ETCO2 and breath sounds checked- equal and bilateral Secured at: 23 cm Tube secured with: Tape Dental Injury: Teeth and Oropharynx as per pre-operative assessment

## 2020-12-20 NOTE — Procedures (Signed)
Extubation Procedure Note  Patient Details:   Name: Timothy Golden DOB: 13-Sep-1965 MRN: [id number]   Airway Documentation:    Vent end date: 12/20/20 Vent end time: 1845   Evaluation  O2 sats: stable throughout Complications: No apparent complications Patient did tolerate procedure well. Bilateral Breath Sounds: Clear,Diminished   Yes   RT extubated patient to 4L Evans per MD order/rapid wean protocol, with RN at bedside. Positive cuff leak noted. VC 1200 and NIF -25. Patient tolerated well. No stridor noted at this time. RT will continue to monitor as needed.   Jaquelyn Bitter 12/20/2020, 6:53 PM

## 2020-12-20 NOTE — Brief Op Note (Signed)
12/16/2020 - 12/20/2020  12:35 PM  PATIENT:  Timothy Golden  55 y.o. male  PRE-OPERATIVE DIAGNOSIS:  CAD  POST-OPERATIVE DIAGNOSIS:  CAD, PFO  PROCEDURE:   CORONARY ARTERY BYPASS GRAFTING (CABG) x FOUR, USING LEFT INTERNAL MAMMARY ARTERY AND RIGHT LEG GREATER SAPHENOUS VEIN HARVESTED ENDOSCOPICALLY   Vein harvest time Vein prep time   LIMA->LAD  SVG-D1 (constructed Y-graft)  SVG->Ramus Int  SVG->OM1  TRANSESOPHAGEAL ECHOCARDIOGRAM   SURGEON:  Alleen Borne, MD - Primary  PHYSICIAN ASSISTANT:  Elanda Garmany  ASSISTANTSCheral Marker, RNFA  ANESTHESIA:   general  EBL: Per perfusion and anesthesia records  BLOOD ADMINISTERED: 2 units FFP  DRAINS: Left pleural and mediastinal drains   LOCAL MEDICATIONS USED:  NONE  SPECIMEN:  No Specimen  DISPOSITION OF SPECIMEN:  N/A  COUNTS:  YES  DICTATION: .Dragon Dictation  PLAN OF CARE: Admit to inpatient   PATIENT DISPOSITION:  ICU - intubated and hemodynamically stable.   Delay start of Pharmacological VTE agent (>24hrs) due to surgical blood loss or risk of bleeding: yes

## 2020-12-21 ENCOUNTER — Inpatient Hospital Stay (HOSPITAL_COMMUNITY): Payer: 59

## 2020-12-21 ENCOUNTER — Encounter (HOSPITAL_COMMUNITY): Payer: Self-pay | Admitting: Surgery

## 2020-12-21 DIAGNOSIS — I214 Non-ST elevation (NSTEMI) myocardial infarction: Secondary | ICD-10-CM | POA: Diagnosis not present

## 2020-12-21 LAB — GLUCOSE, CAPILLARY
Glucose-Capillary: 146 mg/dL — ABNORMAL HIGH (ref 70–99)
Glucose-Capillary: 168 mg/dL — ABNORMAL HIGH (ref 70–99)
Glucose-Capillary: 175 mg/dL — ABNORMAL HIGH (ref 70–99)
Glucose-Capillary: 176 mg/dL — ABNORMAL HIGH (ref 70–99)
Glucose-Capillary: 177 mg/dL — ABNORMAL HIGH (ref 70–99)
Glucose-Capillary: 185 mg/dL — ABNORMAL HIGH (ref 70–99)
Glucose-Capillary: 186 mg/dL — ABNORMAL HIGH (ref 70–99)
Glucose-Capillary: 187 mg/dL — ABNORMAL HIGH (ref 70–99)
Glucose-Capillary: 187 mg/dL — ABNORMAL HIGH (ref 70–99)
Glucose-Capillary: 222 mg/dL — ABNORMAL HIGH (ref 70–99)
Glucose-Capillary: 228 mg/dL — ABNORMAL HIGH (ref 70–99)
Glucose-Capillary: 229 mg/dL — ABNORMAL HIGH (ref 70–99)
Glucose-Capillary: 249 mg/dL — ABNORMAL HIGH (ref 70–99)
Glucose-Capillary: 307 mg/dL — ABNORMAL HIGH (ref 70–99)
Glucose-Capillary: 311 mg/dL — ABNORMAL HIGH (ref 70–99)

## 2020-12-21 LAB — BPAM FFP
Blood Product Expiration Date: 202205052359
Blood Product Expiration Date: 202205052359
ISSUE DATE / TIME: 202205021241
ISSUE DATE / TIME: 202205021241
Unit Type and Rh: 8400
Unit Type and Rh: 8400

## 2020-12-21 LAB — PREPARE FRESH FROZEN PLASMA
Unit division: 0
Unit division: 0

## 2020-12-21 LAB — BASIC METABOLIC PANEL WITH GFR
Anion gap: 8 (ref 5–15)
BUN: 15 mg/dL (ref 6–20)
CO2: 21 mmol/L — ABNORMAL LOW (ref 22–32)
Calcium: 7.9 mg/dL — ABNORMAL LOW (ref 8.9–10.3)
Chloride: 107 mmol/L (ref 98–111)
Creatinine, Ser: 0.9 mg/dL (ref 0.61–1.24)
GFR, Estimated: >60 mL/min
Glucose, Bld: 194 mg/dL — ABNORMAL HIGH (ref 70–99)
Potassium: 4.4 mmol/L (ref 3.5–5.1)
Sodium: 136 mmol/L (ref 135–145)

## 2020-12-21 LAB — TROPONIN I (HIGH SENSITIVITY): Troponin I (High Sensitivity): 25685 ng/L (ref <18)

## 2020-12-21 LAB — CBC
HCT: 36.6 % — ABNORMAL LOW (ref 39.0–52.0)
HCT: 37.9 % — ABNORMAL LOW (ref 39.0–52.0)
Hemoglobin: 11.9 g/dL — ABNORMAL LOW (ref 13.0–17.0)
Hemoglobin: 12.1 g/dL — ABNORMAL LOW (ref 13.0–17.0)
MCH: 27.9 pg (ref 26.0–34.0)
MCH: 27.9 pg (ref 26.0–34.0)
MCHC: 32.5 g/dL (ref 30.0–36.0)
MCHC: 31.9 g/dL (ref 30.0–36.0)
MCV: 85.7 fL (ref 80.0–100.0)
MCV: 87.3 fL (ref 80.0–100.0)
Platelets: 213 10*3/uL (ref 150–400)
Platelets: 224 K/uL (ref 150–400)
RBC: 4.27 MIL/uL (ref 4.22–5.81)
RBC: 4.34 MIL/uL (ref 4.22–5.81)
RDW: 13.9 % (ref 11.5–15.5)
RDW: 14 % (ref 11.5–15.5)
WBC: 13.3 10*3/uL — ABNORMAL HIGH (ref 4.0–10.5)
WBC: 16.3 K/uL — ABNORMAL HIGH (ref 4.0–10.5)
nRBC: 0 % (ref 0.0–0.2)
nRBC: 0 % (ref 0.0–0.2)

## 2020-12-21 LAB — BASIC METABOLIC PANEL
Anion gap: 5 (ref 5–15)
BUN: 18 mg/dL (ref 6–20)
CO2: 23 mmol/L (ref 22–32)
Calcium: 7.5 mg/dL — ABNORMAL LOW (ref 8.9–10.3)
Chloride: 103 mmol/L (ref 98–111)
Creatinine, Ser: 1.06 mg/dL (ref 0.61–1.24)
GFR, Estimated: >60 mL/min (ref >60)
Glucose, Bld: 286 mg/dL — ABNORMAL HIGH (ref 70–99)
Potassium: 4.4 mmol/L (ref 3.5–5.1)
Sodium: 131 mmol/L — ABNORMAL LOW (ref 135–145)

## 2020-12-21 LAB — MAGNESIUM
Magnesium: 2.4 mg/dL (ref 1.7–2.4)
Magnesium: 2.3 mg/dL (ref 1.7–2.4)

## 2020-12-21 MED ORDER — CEFAZOLIN SODIUM-DEXTROSE 1-4 GM/50ML-% IV SOLN
1.0000 g | Freq: Three times a day (TID) | INTRAVENOUS | Status: DC
  Filled 2020-12-21 (×2): qty 50

## 2020-12-21 MED ORDER — ENOXAPARIN SODIUM 40 MG/0.4ML IJ SOSY
40.0000 mg | PREFILLED_SYRINGE | Freq: Every day | INTRAMUSCULAR | Status: DC
  Administered 2020-12-21 – 2020-12-24 (×4): 40 mg via SUBCUTANEOUS
  Filled 2020-12-21 (×4): qty 0.4

## 2020-12-21 MED ORDER — FUROSEMIDE 10 MG/ML IJ SOLN
40.0000 mg | Freq: Two times a day (BID) | INTRAMUSCULAR | Status: AC
  Administered 2020-12-21 (×2): 40 mg via INTRAVENOUS
  Filled 2020-12-21 (×2): qty 4

## 2020-12-21 MED ORDER — INSULIN DETEMIR 100 UNIT/ML ~~LOC~~ SOLN
20.0000 [IU] | Freq: Two times a day (BID) | SUBCUTANEOUS | Status: DC
  Administered 2020-12-21 (×2): 20 [IU] via SUBCUTANEOUS
  Filled 2020-12-21 (×5): qty 0.2

## 2020-12-21 MED ORDER — INSULIN ASPART 100 UNIT/ML IJ SOLN
0.0000 [IU] | INTRAMUSCULAR | Status: DC
  Administered 2020-12-21: 16 [IU] via SUBCUTANEOUS
  Administered 2020-12-21 (×2): 8 [IU] via SUBCUTANEOUS
  Administered 2020-12-21: 16 [IU] via SUBCUTANEOUS
  Administered 2020-12-22: 8 [IU] via SUBCUTANEOUS

## 2020-12-21 MED ORDER — ATORVASTATIN CALCIUM 80 MG PO TABS: 80.0000 mg | ORAL_TABLET | Freq: Every day | ORAL | Status: DC

## 2020-12-21 MED ORDER — CEFAZOLIN SODIUM-DEXTROSE 2-4 GM/100ML-% IV SOLN
2.0000 g | Freq: Three times a day (TID) | INTRAVENOUS | Status: AC
  Administered 2020-12-21 – 2020-12-22 (×3): 2 g via INTRAVENOUS
  Filled 2020-12-21 (×4): qty 100

## 2020-12-21 MED ORDER — POTASSIUM CHLORIDE CRYS ER 20 MEQ PO TBCR
20.0000 meq | EXTENDED_RELEASE_TABLET | Freq: Two times a day (BID) | ORAL | Status: AC
  Administered 2020-12-21 (×2): 20 meq via ORAL
  Filled 2020-12-21 (×2): qty 1

## 2020-12-21 MED ORDER — INSULIN DETEMIR 100 UNIT/ML ~~LOC~~ SOLN: 20.0000 [IU] | Freq: Two times a day (BID) | SUBCUTANEOUS | Status: DC

## 2020-12-21 MED FILL — Heparin Sodium (Porcine) Inj 1000 Unit/ML: INTRAMUSCULAR | Qty: 30 | Status: AC

## 2020-12-21 MED FILL — Potassium Chloride Inj 2 mEq/ML: INTRAVENOUS | Qty: 40 | Status: AC

## 2020-12-21 MED FILL — Magnesium Sulfate Inj 50%: INTRAMUSCULAR | Qty: 10 | Status: AC

## 2020-12-21 MED FILL — Thrombin (Recombinant) For Soln 20000 Unit: CUTANEOUS | Qty: 1 | Status: AC

## 2020-12-21 NOTE — Hospital Course (Addendum)
Admitting Diagnoses  Acute NSTEMI Type 1 diabetes mellitus Hypertension Dyslipidemia Morbid obesity  Discharge Diagnoses  Acute NSTEMI Type 1 diabetes mellitus Hypertension Dyslipidemia Morbid obesity Bilateral, left greater than right, internal coarotid artery stenoses S/P CABG x 4 Expected acute blood loss anemia   History of Present Illness:    The patient is a 55 year old male we are asked to see in cardiac surgery consultation for consideration of CABG.  He has known significant comorbidities and cardiac risk factors including diabetes mellitus type 1, hypertension, hyperlipidemia, CVA  and morbid obesity with a BMI greater than 40.  He presented to the emergency department on 12/16/2020 with chest pain.  Approximately 2 hours prior to arrival in the emergency department he had an episode of heaviness in his chest after getting up.  It was not felt to be exertional in nature and there was no radiation or associated symptoms.  He denied shortness of breath.  He denied nausea or vomiting.  He has no history of tobacco use.  He has no significant family history of coronary artery disease.  Initial EKG showed sinus bradycardia with a heart rate of 54 with no acute ST, T wave abnormalities but there was a noted intraventricular conduction delay.  Initial high sensitive troponin was mildly elevated at 32 but significantly increased on second measurement to 893.  He was felt to require urgent cardiac catheterization and was taken to the Cath Lab where he was found to have significant coronary artery disease.  Please see the full report listed below.  He has significant left main, LAD, ramus intermedius and circumflex disease.  LV function by visual estimate was 35 to 45%.  An echocardiogram obtained on 12/17/20 showed preserved left ventricular function (EF 60-65%) and no structural or functional valvular abnormalities.  Coronary bypass grafting was offered to him by Dr. Laneta Simmers and Mr. Morrical decided  to proceed with surgery.    Hospital Course:   Mr Galas remained stable and pain-free following the left heart cath. He was taken to the OR on 12/20/20 where CABG x 4 was carried out without complication. Please see the operative note below. Following the procedure he separated from cardiopulmonary bypass without difficulty and was transferred to the ICU in stable condition. He did not require any inotropic support. He was weaned form the ventilator and extubated by 7pm on the day of surgery. The chest tubes and monitoring lines were removed on the first postop day and he was mobilized. Diuresis was begun for expected acute volume excess.  Glucose was initially managed with an insulin drip with standard protocols.  Patient was later converted to daily Levemir and sliding scale insulin.  On postop day 1, he was noted to have some EKG changes suggestive of ischemia.  Cardiology team ordered high-sensitivity troponin which was elevated in excess of 25,000.  From a surgical standpoint, we found this difficult to interpret given the fact that the coronary bypass procedure required dissecting out intramyocardial segments of the target coronaries.  Mr. Sciandra remained stable hemodynamically.  He was transferred to 4 E., progressive care, on postop day 2.

## 2020-12-21 NOTE — Progress Notes (Signed)
Date and time results received: 12/21/20 1045  Test: TROPONIN Critical Value: [id number]  Name of Provider Notified: Bartle,MD 12/21/2020 1048  Orders Received? Or Actions Taken?: MD AWARE. Patient asymptomatic. Hemodynamically stable. Chest pain free.

## 2020-12-21 NOTE — Progress Notes (Addendum)
CABG 12/20/2020:  LIMA->LAD  SVG-D1 (constructed Y-graft)  SVG->Ramus Int  SVG->OM1  Awake and progressing. No arrhythmias. ECG this a.m. demonstrates lateral wall acute injury performed at 7:08 AM. Will order cardiac markers and ensure that the surgical team is aware.

## 2020-12-21 NOTE — Anesthesia Postprocedure Evaluation (Signed)
Anesthesia Post Note  Patient: Sekai Gitlin  Procedure(s) Performed: CORONARY ARTERY BYPASS GRAFTING (CABG) x FOUR, USING LEFT INTERNAL MAMMARY ARTERY AND RIGHT LEG GREATER SAPHENOUS VEIN HARVESTED ENDOSCOPICALLY (N/A Chest) TRANSESOPHAGEAL ECHOCARDIOGRAM (TEE) (N/A )     Patient location during evaluation: SICU Anesthesia Type: General Level of consciousness: awake and alert, patient cooperative and oriented Pain management: pain level controlled Vital Signs Assessment: post-procedure vital signs reviewed and stable Respiratory status: spontaneous breathing, nonlabored ventilation, respiratory function stable and patient connected to nasal cannula oxygen Cardiovascular status: blood pressure returned to baseline and stable Postop Assessment: no apparent nausea or vomiting and adequate PO intake Anesthetic complications: no   No complications documented.  Last Vitals:  Vitals:   12/21/20 1330 12/21/20 1400  BP: 131/68 (!) 146/69  Pulse: 84 84  Resp: 14 14  Temp:    SpO2: 97% 98%    Last Pain:  Vitals:   12/21/20 1400  TempSrc:   PainSc: Asleep                 Efosa Treichler,E. Tajah Schreiner

## 2020-12-21 NOTE — Progress Notes (Signed)
1 Day Post-Op Procedure(s) (LRB): CORONARY ARTERY BYPASS GRAFTING (CABG) x FOUR, USING LEFT INTERNAL MAMMARY ARTERY AND RIGHT LEG GREATER SAPHENOUS VEIN HARVESTED ENDOSCOPICALLY (N/A) TRANSESOPHAGEAL ECHOCARDIOGRAM (TEE) (N/A) Subjective: Feels well overall. Pain under control. Some numbness in right hand.  Objective: Vital signs in last 24 hours: Temp:  [98.1 F (36.7 C)-100.4 F (38 C)] 98.6 F (37 C) (05/03 0700) Pulse Rate:  [71-108] 95 (05/03 0700) Cardiac Rhythm: Normal sinus rhythm (05/02 2000) Resp:  [0-39] 15 (05/03 0700) BP: (89-148)/(53-85) 148/83 (05/03 0700) SpO2:  [84 %-100 %] 97 % (05/03 0700) Arterial Line BP: (101-169)/(53-96) 136/65 (05/03 0700) FiO2 (%):  [40 %-50 %] 40 % (05/02 1800) Weight:  [136 kg-142.6 kg] 142.6 kg (05/03 0500)  Hemodynamic parameters for last 24 hours: PAP: (20-81)/(2-57) 24/19 CO:  [4.3 L/min-7.9 L/min] 6.1 L/min CI:  [1.7 L/min/m2-3.1 L/min/m2] 2.4 L/min/m2  Intake/Output from previous day: 05/02 0701 - 05/03 0700 In: 6635.5 [P.O.:90; I.V.:4011.7; BPZWC:5852; IV Piggyback:1396.8] Out: 7782 [UMPNT:6144; Blood:1309; Chest Tube:390] Intake/Output this shift: No intake/output data recorded.  General appearance: alert and cooperative Neurologic: intact Heart: regular rate and rhythm, S1, S2 normal, no murmur, click, rub or gallop Lungs: clear to auscultation bilaterally Extremities: edema mild Wound: dressings dry  Lab Results: Recent Labs    12/20/20 1938 12/20/20 1958 12/21/20 0433  WBC 11.0*  --  13.3*  HGB 11.9* 11.6* 11.9*  HCT 35.7* 34.0* 36.6*  PLT 163  --  213   BMET:  Recent Labs    12/20/20 1938 12/20/20 1958 12/21/20 0433  NA 138 140 136  K 4.6 4.6 4.4  CL 110  --  107  CO2 21*  --  21*  GLUCOSE 159*  --  194*  BUN 16  --  15  CREATININE 0.81  --  0.90  CALCIUM 7.9*  --  7.9*    PT/INR:  Recent Labs    12/20/20 1454  LABPROT 16.4*  INR 1.3*   ABG    Component Value Date/Time   PHART 7.356  12/20/2020 1958   HCO3 20.8 12/20/2020 1958   TCO2 22 12/20/2020 1958   ACIDBASEDEF 4.0 (H) 12/20/2020 1958   O2SAT 99.0 12/20/2020 1958   CBG (last 3)  Recent Labs    12/21/20 0436 12/21/20 0546 12/21/20 0704  GLUCAP 186* 175* 146*   CXR: left lower lobe atelectasis  ECG: mild lateral ST elevation  Assessment/Plan: S/P Procedure(s) (LRB): CORONARY ARTERY BYPASS GRAFTING (CABG) x FOUR, USING LEFT INTERNAL MAMMARY ARTERY AND RIGHT LEG GREATER SAPHENOUS VEIN HARVESTED ENDOSCOPICALLY (N/A) TRANSESOPHAGEAL ECHOCARDIOGRAM (TEE) (N/A)  POD 1  Hemodynamically stable in sinus rhythm. Continue Lopressor.  DM: glucose under control. Transition to Levemir and SSI.  Volume excess: wt is 15 lbs over preop. Start diuresis.  DC chest tubes, swan, arterial line.  IS, OOB.    LOS: 4 days    Alleen Borne 12/21/2020

## 2020-12-21 NOTE — Progress Notes (Signed)
EKG CRITICAL VALUE     12 lead EKG performed.  Critical value noted.  Treasa School, RN notified.   Alto Denver, CCT 12/21/2020 7:37 AM

## 2020-12-21 NOTE — Progress Notes (Signed)
Patient ID: Timothy Golden, male   DOB: [birth year], 55 y.o.   MRN: [id number]  TCTS Evening Rounds:   Hemodynamically stable in sinus rhythm. Up in chair. No pain.  Urine output good  CT output low  CBC    Component Value Date/Time   WBC 16.3 (H) 12/21/2020 1700   RBC 4.34 12/21/2020 1700   HGB 12.1 (L) 12/21/2020 1700   HCT 37.9 (L) 12/21/2020 1700   PLT 224 12/21/2020 1700   MCV 87.3 12/21/2020 1700   MCH 27.9 12/21/2020 1700   MCHC 31.9 12/21/2020 1700   RDW 14.0 12/21/2020 1700     BMET    Component Value Date/Time   NA 131 (L) 12/21/2020 1700   K 4.4 12/21/2020 1700   CL 103 12/21/2020 1700   CO2 23 12/21/2020 1700   GLUCOSE 286 (H) 12/21/2020 1700   BUN 18 12/21/2020 1700   CREATININE 1.06 12/21/2020 1700   CALCIUM 7.5 (L) 12/21/2020 1700   GFRNONAA >60 12/21/2020 1700     A/P:  Stable postop course. Continue current plans

## 2020-12-21 NOTE — Progress Notes (Signed)
Date and time results received: 12/21/20 0910  Test: EKG Critical Value: STEMI  Name of Provider Notified: BARTLE 12/21/2020 0915  Orders Received? Or Actions Taken?: MD AWARE

## 2020-12-21 NOTE — Progress Notes (Signed)
Pharmacy note: post-op antibiotics   Due to critical shortage of cefuroxime, post-op antibiotics will be change to ancef   Plan -Ancef 2gm IV 8hr (last dose on 12/22/20 at 8am)  Machele Deihl, PharmD Clinical Pharmacist **Pharmacist phone directory can now be found on [email or web address] ([email or web address]).  Listed under [facility].    

## 2020-12-22 ENCOUNTER — Telehealth: Payer: Self-pay | Admitting: Cardiovascular Disease

## 2020-12-22 ENCOUNTER — Inpatient Hospital Stay (HOSPITAL_COMMUNITY): Payer: 59

## 2020-12-22 DIAGNOSIS — Z951 Presence of aortocoronary bypass graft: Secondary | ICD-10-CM

## 2020-12-22 DIAGNOSIS — R778 Other specified abnormalities of plasma proteins: Secondary | ICD-10-CM

## 2020-12-22 DIAGNOSIS — R7989 Other specified abnormal findings of blood chemistry: Secondary | ICD-10-CM

## 2020-12-22 LAB — BASIC METABOLIC PANEL
Anion gap: 4 — ABNORMAL LOW (ref 5–15)
BUN: 19 mg/dL (ref 6–20)
CO2: 26 mmol/L (ref 22–32)
Calcium: 8.1 mg/dL — ABNORMAL LOW (ref 8.9–10.3)
Chloride: 102 mmol/L (ref 98–111)
Creatinine, Ser: 0.91 mg/dL (ref 0.61–1.24)
GFR, Estimated: >60 mL/min (ref >60)
Glucose, Bld: 247 mg/dL — ABNORMAL HIGH (ref 70–99)
Potassium: 4.4 mmol/L (ref 3.5–5.1)
Sodium: 132 mmol/L — ABNORMAL LOW (ref 135–145)

## 2020-12-22 LAB — CBC
HCT: 36.5 % — ABNORMAL LOW (ref 39.0–52.0)
Hemoglobin: 11.7 g/dL — ABNORMAL LOW (ref 13.0–17.0)
MCH: 27.7 pg (ref 26.0–34.0)
MCHC: 32.1 g/dL (ref 30.0–36.0)
MCV: 86.5 fL (ref 80.0–100.0)
Platelets: 227 10*3/uL (ref 150–400)
RBC: 4.22 MIL/uL (ref 4.22–5.81)
RDW: 14.4 % (ref 11.5–15.5)
WBC: 13.9 10*3/uL — ABNORMAL HIGH (ref 4.0–10.5)
nRBC: 0 % (ref 0.0–0.2)

## 2020-12-22 LAB — GLUCOSE, CAPILLARY
Glucose-Capillary: 239 mg/dL — ABNORMAL HIGH (ref 70–99)
Glucose-Capillary: 242 mg/dL — ABNORMAL HIGH (ref 70–99)
Glucose-Capillary: 266 mg/dL — ABNORMAL HIGH (ref 70–99)
Glucose-Capillary: 279 mg/dL — ABNORMAL HIGH (ref 70–99)
Glucose-Capillary: 319 mg/dL — ABNORMAL HIGH (ref 70–99)

## 2020-12-22 MED ORDER — POTASSIUM CHLORIDE CRYS ER 20 MEQ PO TBCR
20.0000 meq | EXTENDED_RELEASE_TABLET | Freq: Every day | ORAL | Status: AC
  Administered 2020-12-23 – 2020-12-25 (×3): 20 meq via ORAL
  Filled 2020-12-22 (×3): qty 1

## 2020-12-22 MED ORDER — PANTOPRAZOLE SODIUM 40 MG PO TBEC
40.0000 mg | DELAYED_RELEASE_TABLET | Freq: Every day | ORAL | Status: DC
  Administered 2020-12-22 – 2020-12-25 (×4): 40 mg via ORAL
  Filled 2020-12-22 (×4): qty 1

## 2020-12-22 MED ORDER — ONDANSETRON HCL 4 MG/2ML IJ SOLN: 4.0000 mg | Freq: Four times a day (QID) | INTRAMUSCULAR | Status: DC | PRN

## 2020-12-22 MED ORDER — INSULIN ASPART 100 UNIT/ML IJ SOLN
0.0000 [IU] | Freq: Three times a day (TID) | INTRAMUSCULAR | Status: DC
  Administered 2020-12-22: 16 [IU] via SUBCUTANEOUS
  Administered 2020-12-22 (×2): 12 [IU] via SUBCUTANEOUS
  Administered 2020-12-23: 20 [IU] via SUBCUTANEOUS
  Administered 2020-12-23 (×2): 4 [IU] via SUBCUTANEOUS
  Administered 2020-12-23: 16 [IU] via SUBCUTANEOUS
  Administered 2020-12-24: 18 [IU] via SUBCUTANEOUS
  Administered 2020-12-24: 8 [IU] via SUBCUTANEOUS
  Administered 2020-12-24: 18 [IU] via SUBCUTANEOUS
  Administered 2020-12-25: 2 [IU] via SUBCUTANEOUS

## 2020-12-22 MED ORDER — ~~LOC~~ CARDIAC SURGERY, PATIENT & FAMILY EDUCATION: Freq: Once | Status: AC

## 2020-12-22 MED ORDER — ONDANSETRON HCL 4 MG PO TABS: 4.0000 mg | ORAL_TABLET | Freq: Four times a day (QID) | ORAL | Status: DC | PRN

## 2020-12-22 MED ORDER — SENNOSIDES-DOCUSATE SODIUM 8.6-50 MG PO TABS
1.0000 | ORAL_TABLET | Freq: Two times a day (BID) | ORAL | Status: DC | PRN
  Administered 2020-12-23 – 2020-12-24 (×3): 1 via ORAL
  Filled 2020-12-22 (×3): qty 1

## 2020-12-22 MED ORDER — SODIUM CHLORIDE 0.9% FLUSH
3.0000 mL | Freq: Two times a day (BID) | INTRAVENOUS | Status: DC
  Administered 2020-12-22 – 2020-12-25 (×7): 3 mL via INTRAVENOUS

## 2020-12-22 MED ORDER — ACETAMINOPHEN 325 MG PO TABS
650.0000 mg | ORAL_TABLET | Freq: Four times a day (QID) | ORAL | Status: DC | PRN
  Administered 2020-12-23 (×2): 650 mg via ORAL
  Filled 2020-12-22 (×2): qty 2

## 2020-12-22 MED ORDER — METOPROLOL TARTRATE 25 MG/10 ML ORAL SUSPENSION: 25.0000 mg | Freq: Two times a day (BID) | ORAL | Status: DC

## 2020-12-22 MED ORDER — OXYCODONE HCL 5 MG PO TABS
5.0000 mg | ORAL_TABLET | ORAL | Status: DC | PRN
  Administered 2020-12-22 – 2020-12-24 (×5): 10 mg via ORAL
  Filled 2020-12-22 (×5): qty 2

## 2020-12-22 MED ORDER — FUROSEMIDE 40 MG PO TABS
40.0000 mg | ORAL_TABLET | Freq: Every day | ORAL | Status: AC
  Administered 2020-12-23 – 2020-12-25 (×3): 40 mg via ORAL
  Filled 2020-12-22 (×3): qty 1

## 2020-12-22 MED ORDER — SODIUM CHLORIDE 0.9% FLUSH: 3.0000 mL | INTRAVENOUS | Status: DC | PRN

## 2020-12-22 MED ORDER — INSULIN ASPART 100 UNIT/ML IJ SOLN
6.0000 [IU] | Freq: Three times a day (TID) | INTRAMUSCULAR | Status: DC
  Administered 2020-12-22 – 2020-12-25 (×9): 6 [IU] via SUBCUTANEOUS

## 2020-12-22 MED ORDER — SODIUM CHLORIDE 0.9 % IV SOLN: 250.0000 mL | INTRAVENOUS | Status: DC | PRN

## 2020-12-22 MED ORDER — INSULIN DETEMIR 100 UNIT/ML ~~LOC~~ SOLN
30.0000 [IU] | Freq: Two times a day (BID) | SUBCUTANEOUS | Status: DC
  Administered 2020-12-22 (×2): 30 [IU] via SUBCUTANEOUS
  Filled 2020-12-22 (×5): qty 0.3

## 2020-12-22 MED ORDER — FUROSEMIDE 10 MG/ML IJ SOLN
40.0000 mg | Freq: Two times a day (BID) | INTRAMUSCULAR | Status: AC
  Administered 2020-12-22 (×2): 40 mg via INTRAVENOUS
  Filled 2020-12-22 (×2): qty 4

## 2020-12-22 MED ORDER — TRAMADOL HCL 50 MG PO TABS
50.0000 mg | ORAL_TABLET | ORAL | Status: DC | PRN
  Administered 2020-12-22 (×3): 100 mg via ORAL
  Administered 2020-12-24: 50 mg via ORAL
  Administered 2020-12-24: 100 mg via ORAL
  Filled 2020-12-22 (×2): qty 2
  Filled 2020-12-22: qty 1
  Filled 2020-12-22 (×2): qty 2

## 2020-12-22 MED ORDER — METOPROLOL TARTRATE 25 MG PO TABS
25.0000 mg | ORAL_TABLET | Freq: Two times a day (BID) | ORAL | Status: DC
  Administered 2020-12-22 – 2020-12-25 (×7): 25 mg via ORAL
  Filled 2020-12-22 (×7): qty 1

## 2020-12-22 MED ORDER — METOPROLOL TARTRATE 25 MG PO TABS: 25.0000 mg | ORAL_TABLET | Freq: Two times a day (BID) | ORAL | Status: DC

## 2020-12-22 MED ORDER — INSULIN ASPART 100 UNIT/ML IJ SOLN: 0.0000 [IU] | Freq: Three times a day (TID) | INTRAMUSCULAR | Status: DC

## 2020-12-22 MED ORDER — POTASSIUM CHLORIDE CRYS ER 20 MEQ PO TBCR
20.0000 meq | EXTENDED_RELEASE_TABLET | Freq: Two times a day (BID) | ORAL | Status: AC
  Administered 2020-12-22 (×2): 20 meq via ORAL
  Filled 2020-12-22 (×2): qty 1

## 2020-12-22 MED ORDER — ASPIRIN EC 325 MG PO TBEC
325.0000 mg | DELAYED_RELEASE_TABLET | Freq: Every day | ORAL | Status: DC
  Administered 2020-12-22 – 2020-12-23 (×2): 325 mg via ORAL
  Filled 2020-12-22 (×2): qty 1

## 2020-12-22 MED FILL — Lidocaine HCl Local Soln Prefilled Syringe 100 MG/5ML (2%): INTRAMUSCULAR | Qty: 5 | Status: AC

## 2020-12-22 MED FILL — Heparin Sodium (Porcine) Inj 1000 Unit/ML: INTRAMUSCULAR | Qty: 30 | Status: AC

## 2020-12-22 MED FILL — Sodium Bicarbonate IV Soln 8.4%: INTRAVENOUS | Qty: 50 | Status: AC

## 2020-12-22 MED FILL — Antithrombin III (Human) For Inj 500 Unit: INTRAVENOUS | Qty: 10 | Status: AC

## 2020-12-22 MED FILL — Electrolyte-R (PH 7.4) Solution: INTRAVENOUS | Qty: 4000 | Status: AC

## 2020-12-22 MED FILL — Sodium Chloride IV Soln 0.9%: INTRAVENOUS | Qty: 3000 | Status: AC

## 2020-12-22 NOTE — Progress Notes (Signed)
      [address]       [place] [address]             [phone]      POD # 2 CABG x 4  Awaiting bed on 4E  BP 133/72   Pulse 100   Temp 98.9 F (37.2 C) (Oral)   Resp 20   Ht 6' (1.829 m)   Wt (!) 140.5 kg   SpO2 96%   BMI 42.01 kg/m   2L [place] 96% sat  Intake/Output Summary (Last 24 hours) at 12/22/2020 1832 Last data filed at 12/22/2020 1500 Gross per 24 hour  Intake 833.84 ml  Output 1675 ml  Net -841.16 ml   CBG elevated  Viviann Spare C. Dorris Fetch, MD [facility] [phone]

## 2020-12-22 NOTE — Progress Notes (Addendum)
   He is doing well this morning.  Denies ischemic quality pain.  ECG this a.m. unchanged from yesterday.  Aggressive risk factor modification.  May be a candidate for both aspirin and Plavix therapy for 1 year post bypass surgery.  Prior to discharge, will ensure that basic guideline directed therapy  for secondary prevention is in place.

## 2020-12-22 NOTE — Progress Notes (Addendum)
CARDIAC REHAB PHASE I   PRE:  Rate/Rhythm: 99 SR    BP: sitting 138/65    SaO2: 95 RA  MODE:  Ambulation: 660 ft   POST:  Rate/Rhythm: 116 ST    BP: sitting 147/75     SaO2: 96 RA  Pt able to move out of bed from raised [facility] with verbal cues. Rolled to right side as left shoulder is unable to lift his arm. Stood independently and walked with EVA. Slow and steady, increased distance. VSS. Tired after walk. Declined recliner as he sts it is very uncomfortable and makes him sweat. Return to bed with mod assist for legs, head upright. Pt practiced IS, [phone] mL. Pt needs OT c/s for left arm, RN placed.  [phone]   Harriet Masson CES, ACSM 12/22/2020 2:03 PM

## 2020-12-22 NOTE — Progress Notes (Signed)
2 Days Post-Op Procedure(s) (LRB): CORONARY ARTERY BYPASS GRAFTING (CABG) x FOUR, USING LEFT INTERNAL MAMMARY ARTERY AND RIGHT LEG GREATER SAPHENOUS VEIN HARVESTED ENDOSCOPICALLY (N/A) TRANSESOPHAGEAL ECHOCARDIOGRAM (TEE) (N/A) Subjective: Pain under control. Reports pain and weakness in left shoulder.  Objective: Vital signs in last 24 hours: Temp:  [97.6 F (36.4 C)-98.3 F (36.8 C)] 98.2 F (36.8 C) (05/04 0640) Pulse Rate:  [82-108] 108 (05/04 0600) Cardiac Rhythm: Normal sinus rhythm (05/04 0400) Resp:  [12-31] 31 (05/04 0600) BP: (126-155)/(48-89) 136/55 (05/04 0500) SpO2:  [93 %-98 %] 95 % (05/04 0600) Arterial Line BP: (85-157)/(68-80) 157/72 (05/03 1000) Weight:  [140.5 kg] 140.5 kg (05/04 0500)  Hemodynamic parameters for last 24 hours:    Intake/Output from previous day: 05/03 0701 - 05/04 0700 In: 1384 [P.O.:540; I.V.:359.3; IV Piggyback:484.7] Out: 2550 [Urine:2500; Chest Tube:50] Intake/Output this shift: No intake/output data recorded.  General appearance: alert and cooperative Neurologic: intact. Grip strength good bilaterally. Heart: regular rate and rhythm, S1, S2 normal, no murmur Lungs: diminished breath sounds bibasilar Extremities: edema mild Wound: dressing dry  Lab Results: Recent Labs    12/21/20 1700 12/22/20 0337  WBC 16.3* 13.9*  HGB 12.1* 11.7*  HCT 37.9* 36.5*  PLT 224 227   BMET:  Recent Labs    12/21/20 1700 12/22/20 0337  NA 131* 132*  K 4.4 4.4  CL 103 102  CO2 23 26  GLUCOSE 286* 247*  BUN 18 19  CREATININE 1.06 0.91  CALCIUM 7.5* 8.1*    PT/INR:  Recent Labs    12/20/20 1454  LABPROT 16.4*  INR 1.3*   ABG    Component Value Date/Time   PHART 7.356 12/20/2020 1958   HCO3 20.8 12/20/2020 1958   TCO2 22 12/20/2020 1958   ACIDBASEDEF 4.0 (H) 12/20/2020 1958   O2SAT 99.0 12/20/2020 1958   CBG (last 3)  Recent Labs    12/21/20 2347 12/22/20 0333 12/22/20 0639  GLUCAP 307* 239* 242*   ECG: anterior ST  elevation, inferior ST depression.  CXR: LLL atelectasis.  Assessment/Plan: S/P Procedure(s) (LRB): CORONARY ARTERY BYPASS GRAFTING (CABG) x FOUR, USING LEFT INTERNAL MAMMARY ARTERY AND RIGHT LEG GREATER SAPHENOUS VEIN HARVESTED ENDOSCOPICALLY (N/A) TRANSESOPHAGEAL ECHOCARDIOGRAM (TEE) (N/A)  POD 2  Hemodynamically stable in sinus rhythm. Increase Lopressor to 25 bid.  Uncertain what ECG changes mean. May be related to postop state and incising myocardium to look for intramyocardial vessels. Troponin was 78938 yesterday but postop with intramyocardial vessels I think this is hard to interpret. He has been asymptomatic so I would be conservative and watch this. He also has some un-revascularized territory on the anterior wall and septum with proximal LAD occlusion and diffusely diseased vessel that was grafted distally near apex.  DM: glucose went up to 307 last night, down to 242 this am. Will increase Levemir and add Novolog meal coverage.  Volume excess: diuresed -1166 cc yesterday and wt decreased. Still 9 lbs over preop. Continue diuresis.  DC sleeve.  IS, ambulation  I think his pain and weakness in left shoulder is related to retraction of chest which was very tight and difficult to open. This should improve with time.  Transfer to 4E.   LOS: 5 days    Alleen Borne 12/22/2020

## 2020-12-22 NOTE — Progress Notes (Signed)
Report attempted to 4E21. Will re-attempt at 1810. Patient resting comfortably. VSS. Wife updated of pending transfer.

## 2020-12-22 NOTE — Progress Notes (Signed)
EKG CRITICAL VALUE     12 lead EKG performed.  Critical value noted. Chesley Noon, RN notified.   Shivansh Hardaway H, CCT 12/22/2020 7:17 AM

## 2020-12-22 NOTE — Telephone Encounter (Signed)
Called spoke to patients wife. Wife informed me patient was in the ICU. Informed her of the Paperwork and $29 fee to complete forms received. AO 12/22/20

## 2020-12-23 LAB — GLUCOSE, CAPILLARY
Glucose-Capillary: 190 mg/dL — ABNORMAL HIGH (ref 70–99)
Glucose-Capillary: 389 mg/dL — ABNORMAL HIGH (ref 70–99)
Glucose-Capillary: 335 mg/dL — ABNORMAL HIGH (ref 70–99)
Glucose-Capillary: 190 mg/dL — ABNORMAL HIGH (ref 70–99)

## 2020-12-23 LAB — BASIC METABOLIC PANEL
Anion gap: 6 (ref 5–15)
BUN: 21 mg/dL — ABNORMAL HIGH (ref 6–20)
CO2: 27 mmol/L (ref 22–32)
Calcium: 8.2 mg/dL — ABNORMAL LOW (ref 8.9–10.3)
Chloride: 100 mmol/L (ref 98–111)
Creatinine, Ser: 0.76 mg/dL (ref 0.61–1.24)
GFR, Estimated: >60 mL/min (ref >60)
Glucose, Bld: 219 mg/dL — ABNORMAL HIGH (ref 70–99)
Potassium: 3.9 mmol/L (ref 3.5–5.1)
Sodium: 133 mmol/L — ABNORMAL LOW (ref 135–145)

## 2020-12-23 LAB — CBC
HCT: 34.4 % — ABNORMAL LOW (ref 39.0–52.0)
Hemoglobin: 11.2 g/dL — ABNORMAL LOW (ref 13.0–17.0)
MCH: 27.6 pg (ref 26.0–34.0)
MCHC: 32.6 g/dL (ref 30.0–36.0)
MCV: 84.7 fL (ref 80.0–100.0)
Platelets: 228 10*3/uL (ref 150–400)
RBC: 4.06 MIL/uL — ABNORMAL LOW (ref 4.22–5.81)
RDW: 14.3 % (ref 11.5–15.5)
WBC: 11.8 10*3/uL — ABNORMAL HIGH (ref 4.0–10.5)
nRBC: 0 % (ref 0.0–0.2)

## 2020-12-23 MED ORDER — METFORMIN HCL ER 500 MG PO TB24
1000.0000 mg | ORAL_TABLET | Freq: Two times a day (BID) | ORAL | Status: DC
  Administered 2020-12-23 – 2020-12-25 (×4): 1000 mg via ORAL
  Filled 2020-12-23 (×4): qty 2

## 2020-12-23 MED ORDER — INSULIN DETEMIR 100 UNIT/ML ~~LOC~~ SOLN
40.0000 [IU] | Freq: Two times a day (BID) | SUBCUTANEOUS | Status: DC
  Administered 2020-12-23 – 2020-12-25 (×5): 40 [IU] via SUBCUTANEOUS
  Filled 2020-12-23 (×6): qty 0.4

## 2020-12-23 MED ORDER — CLOPIDOGREL BISULFATE 75 MG PO TABS
75.0000 mg | ORAL_TABLET | Freq: Every day | ORAL | Status: DC
  Administered 2020-12-24 – 2020-12-25 (×2): 75 mg via ORAL
  Filled 2020-12-23 (×2): qty 1

## 2020-12-23 MED ORDER — ASPIRIN EC 81 MG PO TBEC
81.0000 mg | DELAYED_RELEASE_TABLET | Freq: Every day | ORAL | Status: DC
  Administered 2020-12-24 – 2020-12-25 (×2): 81 mg via ORAL
  Filled 2020-12-23 (×2): qty 1

## 2020-12-23 NOTE — Progress Notes (Addendum)
Removed epicardial pacing wires per order. Pacing wire ends intact. VSS. Patient tolerated well. Wife present.  Educated patient on one hour bedrest. Will continue to monitor.  Kenard Gower, RN

## 2020-12-23 NOTE — Progress Notes (Signed)
CARDIAC REHAB PHASE I   PRE:  Rate/Rhythm: 86 SR    BP: sitting 133/68    SaO2: 94 RA  MODE:  Ambulation: 470 ft   POST:  Rate/Rhythm: 118 ST    BP: sitting 149/76     SaO2: 98 RA  Pt needing mod-max assist to get from supine to EOB. Stands independently. Walked with RW. DOE with some wheezing today, rest x1. Slightly better after he coughed. HR up to 118 ST. To recliner. Encouraged IS. Awaiting OT to help with left shoulder which is painful and he has difficulty lifting his arm at all.  [id number]   Harriet Masson CES, ACSM 12/23/2020 1:13 PM

## 2020-12-23 NOTE — Progress Notes (Signed)
Mobility Specialist - Progress Note   12/23/20 1422  Mobility  Activity Ambulated in hall  Level of Assistance Moderate assist, patient does 50-74%  Assistive Device Front wheel walker  Distance Ambulated (ft) 470 ft  Mobility Response Tolerated well  Mobility performed by Mobility specialist  $Mobility charge 1 Mobility   Pre-mobility: 108 HR, 92% SpO2 During mobility: 136 HR Post-mobility: 105 HR, 94% SpO2  Pt mod assist to elevate trunk when sitting up on edge of bed, otherwise standby assist. Pt to recliner after walk, call bell at side.   Mamie Levers Mobility Specialist Mobility Specialist Phone: [phone]

## 2020-12-23 NOTE — Progress Notes (Signed)
3 Days Post-Op Procedure(s) (LRB): CORONARY ARTERY BYPASS GRAFTING (CABG) x FOUR, USING LEFT INTERNAL MAMMARY ARTERY AND RIGHT LEG GREATER SAPHENOUS VEIN HARVESTED ENDOSCOPICALLY (N/A) TRANSESOPHAGEAL ECHOCARDIOGRAM (TEE) (N/A) Subjective: Feels well overall. Only complaint is of weakness in left shoulder and numbness in ulnar aspect of left hand.  Objective: Vital signs in last 24 hours: Temp:  [98.1 F (36.7 C)-99.5 F (37.5 C)] 98.1 F (36.7 C) (05/05 0326) Pulse Rate:  [86-106] 86 (05/05 0326) Cardiac Rhythm: Sinus tachycardia (05/04 2023) Resp:  [15-24] 18 (05/05 0326) BP: (111-149)/(51-77) 129/60 (05/05 0326) SpO2:  [90 %-98 %] 97 % (05/05 0704) Weight:  [140.3 kg] 140.3 kg (05/05 0326)  Hemodynamic parameters for last 24 hours:    Intake/Output from previous day: 05/04 0701 - 05/05 0700 In: 710 [P.O.:610; IV Piggyback:100] Out: 2125 [Urine:2125] Intake/Output this shift: No intake/output data recorded.  General appearance: alert and cooperative Neurologic: difficulty raising left shoulder but I suspect it is muscular from retracting left side of chest for IMA harvest and sternal retractor. left grip is strong. Heart: regular rate and rhythm Lungs: clear to auscultation bilaterally Extremities: edema minimal Wound: dressing dry  Lab Results: Recent Labs    12/22/20 0337 12/23/20 0146  WBC 13.9* 11.8*  HGB 11.7* 11.2*  HCT 36.5* 34.4*  PLT 227 228   BMET:  Recent Labs    12/22/20 0337 12/23/20 0146  NA 132* 133*  K 4.4 3.9  CL 102 100  CO2 26 27  GLUCOSE 247* 219*  BUN 19 21*  CREATININE 0.91 0.76  CALCIUM 8.1* 8.2*    PT/INR:  Recent Labs    12/20/20 1454  LABPROT 16.4*  INR 1.3*   ABG    Component Value Date/Time   PHART 7.356 12/20/2020 1958   HCO3 20.8 12/20/2020 1958   TCO2 22 12/20/2020 1958   ACIDBASEDEF 4.0 (H) 12/20/2020 1958   O2SAT 99.0 12/20/2020 1958   CBG (last 3)  Recent Labs    12/22/20 1540 12/22/20 2152  12/23/20 0633  GLUCAP 279* 319* 190*    Assessment/Plan: S/P Procedure(s) (LRB): CORONARY ARTERY BYPASS GRAFTING (CABG) x FOUR, USING LEFT INTERNAL MAMMARY ARTERY AND RIGHT LEG GREATER SAPHENOUS VEIN HARVESTED ENDOSCOPICALLY (N/A) TRANSESOPHAGEAL ECHOCARDIOGRAM (TEE) (N/A)  POD 3  Hemodynamically stable in sinus rhythm. Continue Lopressor.  DM: glucose still elevated. Resume metformin and increase Levemir.  Volume excess: diuresed well yesterday. Wt still 9 lbs over preop.  PT consult to work with left shoulder. This should improve with time. His upper body is large and it took a lot of force to retract his sternum which is transmitted to the shoulder. He has some brachial plexus stretch with numbness in ulnar distribution of left hand. This usually resolves with time.  Continue IS, ambulation.  DC pacing wires.  Plan to start Plavix tomorrow in addition to ASA per cardiology rec.   LOS: 6 days    Alleen Borne 12/23/2020

## 2020-12-23 NOTE — Progress Notes (Signed)
OT Cancellation Note  Patient Details Name: Hallie Ishida MRN: [id number] DOB: 04/16/1966   Cancelled Treatment:     c/m for the AM. Pt. Nurse states pt. Will be on bedrest until this afternoon. Please see in the PM.  Shaelin Lalley 12/23/2020, 10:31 AM

## 2020-12-23 NOTE — Progress Notes (Addendum)
The patient has been seen in conjunction with Laverda Page, NP. All aspects of care have been considered and discussed. The patient has been personally interviewed, examined, and all clinical data has been reviewed.   Doing well.   Progress Note  Patient Name: Timothy Golden Date of Encounter: 12/23/2020  [facility] Cardiologist: Lance Muss, MD   Subjective   No chest pain, mostly concerned regarding weakness in his left shoulder. Has been trying to get up and but struggling with left arm.   Inpatient Medications    Scheduled Meds: . [START ON 12/24/2020] aspirin EC  81 mg Oral Daily  . atorvastatin  80 mg Oral Daily  . Chlorhexidine Gluconate Cloth  6 each Topical Daily  . [START ON 12/24/2020] clopidogrel  75 mg Oral Daily  . enoxaparin (LOVENOX) injection  40 mg Subcutaneous QHS  . furosemide  40 mg Oral Daily  . insulin aspart  0-24 Units Subcutaneous TID AC & HS  . insulin aspart  6 Units Subcutaneous TID WC  . insulin detemir  40 Units Subcutaneous BID  . metFORMIN  1,000 mg Oral BID WC  . metoprolol tartrate  25 mg Oral BID  . pantoprazole  40 mg Oral QAC breakfast  . potassium chloride  20 mEq Oral Daily  . sodium chloride flush  3 mL Intravenous Q12H   Continuous Infusions: . sodium chloride     PRN Meds: sodium chloride, acetaminophen, ondansetron **OR** ondansetron (ZOFRAN) IV, oxyCODONE, senna-docusate, sodium chloride flush, traMADol   Vital Signs    Vitals:   12/23/20 0004 12/23/20 0326 12/23/20 0704 12/23/20 0823  BP: 131/67 129/60  140/75  Pulse: 90 86  (!) 109  Resp: 18 18  18   Temp: 98.1 F (36.7 C) 98.1 F (36.7 C)  98 F (36.7 C)  TempSrc: Oral Oral  Oral  SpO2: 98% 98% 97% 93%  Weight:  (!) 140.3 kg    Height:        Intake/Output Summary (Last 24 hours) at 12/23/2020 1013 Last data filed at 12/23/2020 02/22/2021 Gross per 24 hour  Intake 730 ml  Output 2325 ml  Net -1595 ml   Last 3 Weights 12/23/2020 12/22/2020 12/21/2020  Weight  (lbs) 309 lb 4.9 oz 309 lb 11.9 oz 314 lb 6 oz  Weight (kg) 140.3 kg 140.5 kg 142.6 kg      Telemetry    ST rates in 100-110 range - Personally Reviewed  ECG    No new tracing this morning.   Physical Exam  Pleasant male, sitting up in bed.  GEN: No acute distress.   Neck: No JVD Cardiac: RRR, no murmurs, rubs, or gallops.  Respiratory: Clear to auscultation bilaterally. GI: Soft, nontender, non-distended  MS: trace LE edema; difficulty with moving the left shoulder. Equal grip strengths Neuro:  Nonfocal  Psych: Normal affect   Labs    High Sensitivity Troponin:   Recent Labs  Lab 12/16/20 0833 12/16/20 1054 12/21/20 0937  TROPONINIHS 32* 893* 25,685*      Chemistry Recent Labs  Lab 12/20/20 0620 12/20/20 0825 12/21/20 1700 12/22/20 0337 12/23/20 0146  NA 140   < > 131* 132* 133*  K 3.7   < > 4.4 4.4 3.9  CL 105   < > 103 102 100  CO2 25   < > 23 26 27   GLUCOSE 134*   < > 286* 247* 219*  BUN 15   < > 18 19 21*  CREATININE 0.92   < >  1.06 0.91 0.76  CALCIUM 9.0   < > 7.5* 8.1* 8.2*  PROT 6.3*  --   --   --   --   ALBUMIN 3.5  --   --   --   --   AST 41  --   --   --   --   ALT 29  --   --   --   --   ALKPHOS 88  --   --   --   --   BILITOT 0.9  --   --   --   --   GFRNONAA >60   < > >60 >60 >60  ANIONGAP 10   < > 5 4* 6   < > = values in this interval not displayed.     Hematology Recent Labs  Lab 12/21/20 1700 12/22/20 0337 12/23/20 0146  WBC 16.3* 13.9* 11.8*  RBC 4.34 4.22 4.06*  HGB 12.1* 11.7* 11.2*  HCT 37.9* 36.5* 34.4*  MCV 87.3 86.5 84.7  MCH 27.9 27.7 27.6  MCHC 31.9 32.1 32.6  RDW 14.0 14.4 14.3  PLT 224 227 228    BNPNo results for input(s): BNP, PROBNP in the last 168 hours.   DDimer No results for input(s): DDIMER in the last 168 hours.   Radiology    DG Chest Port 1 View  Result Date: 12/22/2020 CLINICAL DATA:  Chest tube removal, post open heart surgery EXAM: PORTABLE CHEST 1 VIEW COMPARISON:  Radiograph 12/21/2020  FINDINGS: Removal of a Swan-Ganz catheter seen on comparison exam. Right IJ catheter sheath remains in place, terminating in the upper SVC. Removal of the mediastinal drain and left pleural drain. Low lung volumes and atelectasis with minimal residual pulmonary vascular congestion. Some additional streaky opacities in the left lung base may reflect residual edema or atelectasis. Some trace left pleural effusion is likely present as well. No visible pneumothorax. Stable postoperative mediastinal widening with postsurgical changes from sternotomy and CABG. No acute osseous or soft tissue abnormality. Degenerative changes are present in the imaged spine and shoulders. IMPRESSION: Postoperative appearance of the chest, residual opacity in the left lung base, likely atelectasis and/or edema with trace effusion. Lines and tubes as above. Electronically Signed   By: Kreg Shropshire M.D.   On: 12/22/2020 06:16    Cardiac Studies   Cath: 12/16/20   Ost LAD to Prox LAD lesion is 100% stenosed. Right to left collaterals are present.  Dist LM lesion is 60% stenosed.  Ramus lesion is 75% stenosed.  Ost Cx lesion is 60% stenosed.  The left ventricular ejection fraction is 35-45% by visual estimate.  There is mild to moderate left ventricular systolic dysfunction.  LV end diastolic pressure is mildly elevated.  There is no aortic valve stenosis.   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  Patient is hemodynamically stable without chest pain.   Diagnostic Dominance: Co-dominant    Echo: 12/17/20  IMPRESSIONS    1. Left ventricular ejection fraction, by estimation, is 60 to 65%. The  left ventricle has normal function. The left ventricle has no regional  wall motion abnormalities. Left ventricular diastolic parameters are  consistent with Grade II diastolic  dysfunction (pseudonormalization). Elevated left ventricular end-diastolic  pressure.  2. Right ventricular  systolic function is normal. The right ventricular  size is normal. There is normal pulmonary artery systolic pressure.  3. Left atrial size was moderately dilated.  4. The mitral valve is normal in structure. Trivial mitral valve  regurgitation. No evidence of mitral stenosis.  5. The aortic valve is tricuspid. Aortic valve regurgitation is not  visualized. No aortic stenosis is present.  6. Aortic dilatation noted. 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  7. The inferior vena cava is normal in size with greater than 50%  respiratory variability, suggesting right atrial pressure of 3 mmHg.   Patient Profile     55 y.o. male with PMH of HTN, HLD, DM and obesity who presented [facility] ED with chest pain. 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 TCTS consulted and underwent CABG.   Assessment & Plan    1. NSTEMI: cath noted above. Now s/p CABG x4 with Dr. Laneta Simmers. Has been working with CR and progressing. Issues with left shoulder pain. Treated with ASA, high dose statin and BB therapy. EF noted 60-65% with no rWMA.   2. HTN: blood pressures are stable. He has been tachycardic, consider further increase in BB therapy.  -- also would like to restart losartan prior to discharge given hx of DM  3. HLD: now on high dose statin. LDL 103 on admission. -- FLP/LFTs in 8 weeks   4. Abnormal EKG: EKG from 5/3 was concerning for lateral MI, but he was noted asymptomatic. Follow [place] hsTn [id number], but difficult to interpret in the setting of CABG. He has had no chest pain. Being managed conservatively at this time.   5. DM: Hgb A1c 8.2 -- has been resumed on metformin, humalog and toujeo PTA -- consider SGLT2 prior to discharge  6. Left shoulder weakness: PT consulted via primary.    For questions or updates, please contact [facility] Please consult [email or web address] for contact info under        Signed, Laverda Page, NP  12/23/2020, 10:13 AM

## 2020-12-24 ENCOUNTER — Telehealth: Payer: Self-pay

## 2020-12-24 ENCOUNTER — Inpatient Hospital Stay (HOSPITAL_COMMUNITY): Payer: 59

## 2020-12-24 ENCOUNTER — Other Ambulatory Visit (HOSPITAL_COMMUNITY): Payer: Self-pay

## 2020-12-24 LAB — GLUCOSE, CAPILLARY
Glucose-Capillary: 104 mg/dL — ABNORMAL HIGH (ref 70–99)
Glucose-Capillary: 275 mg/dL — ABNORMAL HIGH (ref 70–99)
Glucose-Capillary: 298 mg/dL — ABNORMAL HIGH (ref 70–99)
Glucose-Capillary: 239 mg/dL — ABNORMAL HIGH (ref 70–99)

## 2020-12-24 MED ORDER — POLYETHYLENE GLYCOL 3350 17 G PO PACK
17.0000 g | PACK | Freq: Once | ORAL | Status: AC
  Administered 2020-12-24: 17 g via ORAL
  Filled 2020-12-24: qty 1

## 2020-12-24 MED ORDER — LACTULOSE 10 GM/15ML PO SOLN
20.0000 g | Freq: Once | ORAL | Status: AC
  Administered 2020-12-24: 20 g via ORAL
  Filled 2020-12-24: qty 30

## 2020-12-24 NOTE — Progress Notes (Addendum)
CARDIAC REHAB PHASE I   PRE:  Rate/Rhythm: 108 ST    BP: sitting 156/79    SaO2: 95 RA  MODE:  Ambulation: 470 ft   POST:  Rate/Rhythm: 136 ST    BP: sitting 160/91     SaO2: 100 RA  Pt able to walk without RW. Steady. HR much higher this am. Just received meds. SOB/congestion is better today. To recliner after walk.  Wife got recliner for home so getting out of bed will not be a problem.  Discussed ed with pt and wife. Discussed IS, sternal precautions, diet, exercise and CRPII. Pt receptive. He knows he needs diet change and exercise. Will refer to [place] CRPII.  [id number]  Harriet Masson CES, ACSM 12/24/2020 9:56 AM

## 2020-12-24 NOTE — TOC Benefit Eligibility Note (Signed)
Patient Product/process development scientist completed.    The patient is currently admitted and upon discharge could be taking Farxiga 10 mg.  The current 30 day co-pay is, $15.00.   The patient is currently admitted and upon discharge could be taking Jardiance 10 mg.  The current 30 day co-pay is, $34.99.   The patient is insured through [employer]    Roland Earl, CPhT Pharmacy Patient Advocate Specialist [facility] Antimicrobial Stewardship Team Direct Number: [phone]  Fax: [phone]

## 2020-12-24 NOTE — Evaluation (Signed)
Physical Therapy Evaluation/ discharge Patient Details Name: Timothy Golden MRN: [id number] DOB: [birth year] Today's Date: 12/24/2020   History of Present Illness  55 yo admitted 4/28 with chest pain and CAD. 5/2 CABG x 4. PMhx: DM, HTN, HLD, obesity  Clinical Impression  Pt pleasant and willing to mobilize states he has had no deficits in LUE until post op. Pt with decreased shoulder flexion, abduction, strength and ROM limited by pain with decreased sensation and strength of ulnar nerve distribution left hand with possible brachial plexus stretch during sx. Pt educated for bed mobility, transfers, limited accommodation of LUE weakness and wall walking with message sent to OT and deferral of further need to acute OT. Pt able to state all sternal precautions and is perform long hall gait distance with cardiac rehab and mobility tech. Will sign off with deferral to OT and initial education provided this session with pt stating understanding and agreement.     Follow Up Recommendations No PT follow up (defer to OT)    Equipment Recommendations  3in1 (PT) wife can possible attain   Recommendations for Other Services OT consult     Precautions / Restrictions Precautions Precautions: Sternal Precaution Booklet Issued: No Precaution Comments: pt reports he has handout      Mobility  Bed Mobility Overal bed mobility: Needs Assistance Bed Mobility: Rolling;Sidelying to Sit;Sit to Sidelying Rolling: Min assist;Supervision Sidelying to sit: Min guard     Sit to sidelying: Supervision General bed mobility comments: initial roll to right with min assist to support left shoulder and roll to right with pt able to rise into sitting with cues. Return to sidely without physical assist and on 2nd trial of bed exit pt able to roll and rise without cues or assist    Transfers Overall transfer level: Modified independent               General transfer comment: pt with tendency to want to push  with RUE to scoot and educated for restrictions/precautions with all mobility  Ambulation/Gait Ambulation/Gait assistance: Independent Gait Distance (Feet): 50 Feet Assistive device: None Gait Pattern/deviations: WFL(Within Functional Limits)   Gait velocity interpretation: >2.62 ft/sec, indicative of community ambulatory General Gait Details: pt walked long hall distance with cardiac rehab earlier and reports no deficits with gait with limited gait demonstrated in room at independent level  Stairs            Wheelchair Mobility    Modified Rankin (Stroke Patients Only)       Balance Overall balance assessment: No apparent balance deficits (not formally assessed)                                           Pertinent Vitals/Pain Pain Assessment: 0-10 Pain Score: 7  Pain Location: left shoulder with flexion Pain Descriptors / Indicators: Aching;Guarding Pain Intervention(s): Limited activity within patient's tolerance;Monitored during session;Repositioned    Home Living Family/patient expects to be discharged to:: Private residence Living Arrangements: Spouse/significant other Available Help at Discharge: Family;Available PRN/intermittently Type of Home: House Home Access: Level entry     Home Layout: One level Home Equipment: None      Prior Function Level of Independence: Independent               Hand Dominance        Extremity/Trunk Assessment   Upper Extremity Assessment Upper Extremity Assessment: LUE  deficits/detail LUE Deficits / Details: shoulder flexion: AROm 40 degrees, AAROM 70 degrees, limited external rotation due to pain. 2/5 grip strength and impaired sensation ulnar nerve distribution of left hand, decreased supination    Lower Extremity Assessment Lower Extremity Assessment: Overall WFL for tasks assessed    Cervical / Trunk Assessment Cervical / Trunk Assessment: Normal  Communication   Communication: No  difficulties  Cognition Arousal/Alertness: Awake/alert Behavior During Therapy: WFL for tasks assessed/performed Overall Cognitive Status: Within Functional Limits for tasks assessed                                        General Comments      Exercises General Exercises - Upper Extremity Shoulder Flexion: AAROM;Standing;Left;Seated;Other reps (comment) (2 reps seated, one standing with wall walking limited to grossly 70 degrees)   Assessment/Plan    PT Assessment Patent does not need any further PT services (will defer to OT for further recommendations)  PT Problem List         PT Treatment Interventions      PT Goals (Current goals can be found in the Care Plan section)  Acute Rehab PT Goals PT Goal Formulation: All assessment and education complete, DC therapy    Frequency     Barriers to discharge        Co-evaluation               AM-PAC PT "6 Clicks" Mobility  Outcome Measure Help needed turning from your back to your side while in a flat bed without using bedrails?: None Help needed moving from lying on your back to sitting on the side of a flat bed without using bedrails?: A Little Help needed moving to and from a bed to a chair (including a wheelchair)?: A Little Help needed standing up from a chair using your arms (e.g., wheelchair or bedside chair)?: None Help needed to walk in hospital room?: None Help needed climbing 3-5 steps with a railing? : None 6 Click Score: 22    End of Session   Activity Tolerance: Patient tolerated treatment well Patient left: in bed;with call bell/phone within reach Nurse Communication: Mobility status PT Visit Diagnosis: Muscle weakness (generalized) (M62.81);Other abnormalities of gait and mobility (R26.89)    Time: 6440-3474 PT Time Calculation (min) (ACUTE ONLY): 31 min   Charges:   PT Evaluation $PT Eval Moderate Complexity: 1 Mod PT Treatments $Therapeutic Activity: 8-22 mins         Rickia Freeburg P, PT Acute Rehabilitation Services Pager: [phone] Office: [phone]   Juma Oxley B Trease [place] 12/24/2020, 1:22 PM

## 2020-12-24 NOTE — Progress Notes (Signed)
Mobility Specialist: Progress Note   12/24/20 1720  Mobility  Activity Ambulated in hall  Level of Assistance Independent  Assistive Device None  Distance Ambulated (ft) 470 ft  Mobility Response Tolerated well  Mobility performed by Mobility specialist  Bed Position Chair  $Mobility charge 1 Mobility   Pre-Mobility: 99 HR Post-Mobility: 122 HR, 95% SpO2  Pt c/o some chest tightness and L shoulder pain during ambulation. Pt said he is experiencing L shoulder pain and significant reduction in ROM post surgery. Pt otherwise asx. Pt sitting in chair eating dinner.   [facility] Machael Raine Mobility Specialist Mobility Specialist Phone: [phone]

## 2020-12-24 NOTE — Telephone Encounter (Signed)
STD form completed and faxed to [facility] @ [phone]. Beginning leave 12/16/20 through 03/21/21

## 2020-12-24 NOTE — Evaluation (Signed)
Occupational Therapy Evaluation Patient Details Name: Timothy Golden MRN: [id number] DOB: 1966-02-11 Today's Date: 12/24/2020    History of Present Illness 55 yo admitted 4/28 with chest pain and CAD. 5/2 CABG x 4. PMhx: DM, HTN, HLD, obesity   Clinical Impression   Pt Mod I with mobility with no AD and requires min A with selfcare tasks due to pain and impaired AROM of L shoulder/UE. Pt reports pain onset as new since CABG. Educated pt on use of ADL A/E for LB selfcare and educated pt on very gentle AROM of L shoulder/UE while adhering to sternal precautions. Pt hopeful to d/c home tomorrow where he will have assist from his wife    Follow Up Recommendations  Outpatient OT (for L shoulder/UE)    Equipment Recommendations  Other (comment) (reacher, LH bath sponge)    Recommendations for Other Services       Precautions / Restrictions Precautions Precautions: Sternal Precaution Booklet Issued: No Precaution Comments: pt reports he has handout, able to verbalize sternal precautions Restrictions Weight Bearing Restrictions: Yes RUE Weight Bearing: Non weight bearing      Mobility Bed Mobility Overal bed mobility: Needs Assistance Bed Mobility: Sidelying to Sit;Sit to Sidelying Rolling: Min assist;Supervision Sidelying to sit: Min guard     Sit to sidelying: Supervision General bed mobility comments: assist to elevate trunk ti sit EOB, no assist to return to supine    Transfers Overall transfer level: Modified independent               General transfer comment: pt with tendency to want to push with RUE to scoot and educated for restrictions/precautions with all mobility    Balance Overall balance assessment: No apparent balance deficits (not formally assessed)                                         ADL either performed or assessed with clinical judgement   ADL Overall ADL's : Needs assistance/impaired Eating/Feeding: Set  up;Independent;Sitting   Grooming: Wash/dry hands;Wash/dry face;Supervision/safety;Standing   Upper Body Bathing: Minimal assistance   Lower Body Bathing: Minimal assistance   Upper Body Dressing : Minimal assistance   Lower Body Dressing: Minimal assistance   Toilet Transfer: Modified Independent   Toileting- Clothing Manipulation and Hygiene: Min guard       Functional mobility during ADLs: Modified independent General ADL Comments: min A with UB and LB selfcare due to pain and impaired AROM in L UE. Pt educated on ADL A/E for home use for LB selfcare, video demo and handout provided     Vision Patient Visual Report: No change from baseline       Perception     Praxis      Pertinent Vitals/Pain Pain Assessment: 0-10 Pain Score: 6  Pain Location: left shoulder with flexion Pain Descriptors / Indicators: Aching;Guarding Pain Intervention(s): Limited activity within patient's tolerance;Monitored during session;Repositioned     Hand Dominance Right   Extremity/Trunk Assessment Upper Extremity Assessment Upper Extremity Assessment: LUE deficits/detail LUE Deficits / Details: shoulder flexion: AROm 40 degrees, AAROM 70 degrees, limited external rotation due to pain. 2/5 grip strength and impaired sensation ulnar nerve distribution of left hand, decreased supination   Lower Extremity Assessment Lower Extremity Assessment: Defer to PT evaluation   Cervical / Trunk Assessment Cervical / Trunk Assessment: Normal   Communication Communication Communication: No difficulties   Cognition Arousal/Alertness: Awake/alert Behavior  During Therapy: WFL for tasks assessed/performed Overall Cognitive Status: Within Functional Limits for tasks assessed                                     General Comments       Exercises General Exercises - Upper Extremity Shoulder Flexion: AAROM;Standing;Left;Seated;Other reps (comment) (2 reps seated, one standing with wall  walking limited to grossly 70 degrees) Other Exercises Other Exercises: educated on very gentle AROM of L shoulder/UE while adhering to sternal precautions   Shoulder Instructions      Home Living Family/patient expects to be discharged to:: Private residence Living Arrangements: Spouse/significant other Available Help at Discharge: Family;Available PRN/intermittently Type of Home: House Home Access: Level entry     Home Layout: One level     Bathroom Shower/Tub: Producer, television/film/video: Standard     Home Equipment: None          Prior Functioning/Environment Level of Independence: Independent                 OT Problem List: Decreased strength;Pain;Decreased coordination;Impaired UE functional use;Decreased range of motion      OT Treatment/Interventions:      OT Goals(Current goals can be found in the care plan section) Acute Rehab OT Goals Patient Stated Goal: go home OT Goal Formulation: With patient Time For Goal Achievement: 01/07/21 Potential to Achieve Goals: Good ADL Goals Additional ADL Goal #1: Pt will tolerate gentle ROM exercises L shoulder/UE while adhering to sternal precautions  OT Frequency:     Barriers to D/C:            Co-evaluation              AM-PAC OT "6 Clicks" Daily Activity     Outcome Measure Help from another person eating meals?: None Help from another person taking care of personal grooming?: None Help from another person toileting, which includes using toliet, bedpan, or urinal?: A Little Help from another person bathing (including washing, rinsing, drying)?: A Little Help from another person to put on and taking off regular upper body clothing?: A Little Help from another person to put on and taking off regular lower body clothing?: A Little 6 Click Score: 20   End of Session    Activity Tolerance: Patient tolerated treatment well Patient left: in bed;with call bell/phone within reach  OT Visit  Diagnosis: Muscle weakness (generalized) (M62.81);Pain Pain - Right/Left: Left Pain - part of body: Shoulder                Time: 1134-1200 OT Time Calculation (min): 26 min Charges:  OT General Charges $OT Visit: 1 Visit OT Evaluation $OT Eval Low Complexity: 1 Low OT Treatments $Therapeutic Activity: 8-22 mins    Galen Manila 12/24/2020, 2:41 PM

## 2020-12-24 NOTE — Progress Notes (Addendum)
The patient has been seen in conjunction with Laverda Page, NP. All aspects of care have been considered and discussed. The patient has been personally interviewed, examined, and all clinical data has been reviewed.  No arrhythmia. Stable clinically.   Progress Note  Patient Name: Timothy Golden Date of Encounter: 12/24/2020  [facility] Cardiologist: Lance Muss, MD   Subjective   Ok this morning. Wife at the bedside. Biggest concern is related to left shoulder weakness. No chest pain.   Inpatient Medications    Scheduled Meds:  aspirin EC  81 mg Oral Daily   atorvastatin  80 mg Oral Daily   Chlorhexidine Gluconate Cloth  6 each Topical Daily   clopidogrel  75 mg Oral Daily   enoxaparin (LOVENOX) injection  40 mg Subcutaneous QHS   furosemide  40 mg Oral Daily   insulin aspart  0-24 Units Subcutaneous TID AC & HS   insulin aspart  6 Units Subcutaneous TID WC   insulin detemir  40 Units Subcutaneous BID   lactulose  20 g Oral Once   metFORMIN  1,000 mg Oral BID WC   metoprolol tartrate  25 mg Oral BID   pantoprazole  40 mg Oral QAC breakfast   polyethylene glycol  17 g Oral Once   potassium chloride  20 mEq Oral Daily   sodium chloride flush  3 mL Intravenous Q12H   Continuous Infusions:  sodium chloride     PRN Meds: sodium chloride, acetaminophen, ondansetron **OR** ondansetron (ZOFRAN) IV, oxyCODONE, senna-docusate, sodium chloride flush, traMADol   Vital Signs    Vitals:   12/23/20 2106 12/23/20 2331 12/24/20 0342 12/24/20 0754  BP: 131/61 (!) 112/54 (!) 129/59 133/60  Pulse: 96 95 85 92  Resp: 19 19 18 18   Temp: 99.1 F (37.3 C) 98.4 F (36.9 C) 99.2 F (37.3 C) 99.6 F (37.6 C)  TempSrc: Oral Oral Oral Oral  SpO2: 97% 95% 95% 95%  Weight:   (!) 137.1 kg   Height:        Intake/Output Summary (Last 24 hours) at 12/24/2020 0904 Last data filed at 12/23/2020 1919 Gross per 24 hour  Intake 490 ml  Output --  Net 490 ml   Last 3 Weights  12/24/2020 12/23/2020 12/22/2020  Weight (lbs) 302 lb 4.8 oz 309 lb 4.9 oz 309 lb 11.9 oz  Weight (kg) 137.122 kg 140.3 kg 140.5 kg      Telemetry    ST rates 100s - Personally Reviewed  ECG    No new tracing this morning.   Physical Exam  Pleasant male GEN: No acute distress.   Neck: No JVD Cardiac: RRR, no murmurs, rubs, or gallops.  Respiratory: Clear to auscultation bilaterally. GI: Soft, nontender, non-distended  MS: No edema; No deformity. Continued difficulty moving left arm at shoulder.  Neuro:  Nonfocal Psych: Normal affect   Labs    High Sensitivity Troponin:   Recent Labs  Lab 12/16/20 0833 12/16/20 1054 12/21/20 0937  TROPONINIHS 32* 893* 25,685*      Chemistry Recent Labs  Lab 12/20/20 0620 12/20/20 0825 12/21/20 1700 12/22/20 0337 12/23/20 0146  NA 140   < > 131* 132* 133*  K 3.7   < > 4.4 4.4 3.9  CL 105   < > 103 102 100  CO2 25   < > 23 26 27   GLUCOSE 134*   < > 286* 247* 219*  BUN 15   < > 18 19 21*  CREATININE 0.92   < >  1.06 0.91 0.76  CALCIUM 9.0   < > 7.5* 8.1* 8.2*  PROT 6.3*  --   --   --   --   ALBUMIN 3.5  --   --   --   --   AST 41  --   --   --   --   ALT 29  --   --   --   --   ALKPHOS 88  --   --   --   --   BILITOT 0.9  --   --   --   --   GFRNONAA >60   < > >60 >60 >60  ANIONGAP 10   < > 5 4* 6   < > = values in this interval not displayed.     Hematology Recent Labs  Lab 12/21/20 1700 12/22/20 0337 12/23/20 0146  WBC 16.3* 13.9* 11.8*  RBC 4.34 4.22 4.06*  HGB 12.1* 11.7* 11.2*  HCT 37.9* 36.5* 34.4*  MCV 87.3 86.5 84.7  MCH 27.9 27.7 27.6  MCHC 31.9 32.1 32.6  RDW 14.0 14.4 14.3  PLT 224 227 228    BNPNo results for input(s): BNP, PROBNP in the last 168 hours.   DDimer No results for input(s): DDIMER in the last 168 hours.   Radiology    No results found.  Cardiac Studies   Cath: 12/16/20   Ost LAD to Prox LAD lesion is 100% stenosed. Right to left collaterals are present. Dist LM lesion is 60%  stenosed. Ramus lesion is 75% stenosed. Ost Cx lesion is 60% stenosed. The left ventricular ejection fraction is 35-45% by visual estimate. There is mild to moderate left ventricular systolic dysfunction. LV end diastolic pressure is mildly elevated. There is no aortic valve stenosis.   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  Patient is hemodynamically stable without chest pain.     Diagnostic Dominance: Co-dominant      Echo: 12/17/20   IMPRESSIONS     1. Left ventricular ejection fraction, by estimation, is 60 to 65%. The  left ventricle has normal function. The left ventricle has no regional  wall motion abnormalities. Left ventricular diastolic parameters are  consistent with Grade II diastolic  dysfunction (pseudonormalization). Elevated left ventricular end-diastolic  pressure.   2. Right ventricular systolic function is normal. The right ventricular  size is normal. There is normal pulmonary artery systolic pressure.   3. Left atrial size was moderately dilated.   4. The mitral valve is normal in structure. Trivial mitral valve  regurgitation. No evidence of mitral stenosis.   5. The aortic valve is tricuspid. Aortic valve regurgitation is not  visualized. No aortic stenosis is present.   6. Aortic dilatation noted. 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   7. The inferior vena cava is normal in size with greater than 50%  respiratory variability, suggesting right atrial pressure of 3 mmHg.   Patient Profile     55 y.o. male with PMH of HTN, HLD, DM and obesity who presented [facility] with chest pain. 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 TCTS consulted and underwent CABG.   Assessment & Plan    1. NSTEMI: cath noted above. Now s/p CABG x4 with Dr. Laneta Simmers. Has been working with CR and progressing. Issues with left shoulder pain. Treated with ASA, high dose statin and BB therapy. EF noted 60-65% with no  rWMA. Plavix added today in the setting  of NSTEMI. ASA reduced to 81mg  daily.   2. HTN: blood pressures are stable. He has been tachycardic, consider further increase in BB therapy.  -- also would like to restart losartan prior to discharge given hx of DM   3. HLD: now on high dose statin. LDL 103 on admission. -- FLP/LFTs in 8 weeks    4. Abnormal EKG: EKG from 5/3 was concerning for lateral MI, but he was noted asymptomatic. Follow up hsTn , but difficult to interpret in the setting of CABG. He has had no chest pain. Being managed conservatively at this time.    5. DM: Hgb A1c 8.2 -- has been resumed on metformin--> humalog and toujeo PTA -- consider SGLT2 prior to discharge ( cost check per PharmD) -- discussion with wife and patient regarding the need for tight glycemic control.    6. Left shoulder weakness: PT/OT consulted via primary.    For questions or updates, please contact [facility] Please consult [email or web address] for contact info under        Signed, [id number], NP  12/24/2020, 9:04 AM

## 2020-12-24 NOTE — Discharge Instructions (Signed)

## 2020-12-24 NOTE — Plan of Care (Signed)
  Problem: Education: Goal: Understanding of CV disease, CV risk reduction, and recovery process will improve Outcome: Progressing   Problem: Cardiovascular: Goal: Vascular access site(s) Level 0-1 will be maintained Outcome: Progressing   Problem: Education: Goal: Knowledge of General Education information will improve Description: Including pain rating scale, medication(s)/side effects and non-pharmacologic comfort measures Outcome: Progressing   Problem: Health Behavior/Discharge Planning: Goal: Ability to manage health-related needs will improve Outcome: Progressing   Problem: Clinical Measurements: Goal: Respiratory complications will improve Outcome: Progressing   Problem: Activity: Goal: Risk for activity intolerance will decrease Outcome: Progressing

## 2020-12-24 NOTE — Discharge Summary (Addendum)
Physician Discharge Summary  Patient ID: Timothy Golden MRN: [id number] DOB/AGE: 55-Apr-1967 55 y.o.  Admit date: 12/16/2020 Discharge date: 12/25/2020  Admitting Diagnoses  Acute NSTEMI Type 1 diabetes mellitus Hypertension Dyslipidemia Morbid obesity  Discharge Diagnoses  Acute NSTEMI Type 1 diabetes mellitus Hypertension Dyslipidemia Morbid obesity Bilateral, left greater than right, internal coarotid artery stenoses S/P CABG x 4 Expected acute blood loss anemia  History of Present Illness:    The patient is a 55 year old male we are asked to see in cardiac surgery consultation for consideration of CABG.  He has known significant comorbidities and cardiac risk factors including diabetes mellitus type 1, hypertension, hyperlipidemia, CVA  and morbid obesity with a BMI greater than 40.  He presented to the emergency department on 12/16/2020 with chest pain.  Approximately 2 hours prior to arrival in the emergency department he had an episode of heaviness in his chest after getting up.  It was not felt to be exertional in nature and there was no radiation or associated symptoms.  He denied shortness of breath.  He denied nausea or vomiting.  He has no history of tobacco use.  He has no significant family history of coronary artery disease.  Initial EKG showed sinus bradycardia with a heart rate of 54 with no acute ST, T wave abnormalities but there was a noted intraventricular conduction delay.  Initial high sensitive troponin was mildly elevated at 32 but significantly increased on second measurement to 893.  He was felt to require urgent cardiac catheterization and was taken to the Cath Lab where he was found to have significant coronary artery disease.  Please see the full report listed below.  He has significant left main, LAD, ramus intermedius and circumflex disease.  LV function by visual estimate was 35 to 45%.  An echocardiogram obtained on 12/17/20 showed preserved left ventricular  function (EF 60-65%) and no structural or functional valvular abnormalities. Pre-op screening duplex scan demonstrated bilateral internal carotid artery stenoses. This was evaluated by the vascular surgery service (Dr. Myra GianottiBrabham) and CTA was recommended. After reviewing this study, Dr. Myra GianottiBrabham recommended proceeding with CABG and he will plan for follow up carotid duplex scan in 6 months.  Coronary bypass grafting was offered to him by Dr. Laneta SimmersBartle and Timothy. Begin decided to proceed with surgery.    Hospital Course:   Timothy Golden remained stable and pain-free following the left heart cath. He was taken to the OR on 12/20/20 where CABG x 4 was carried out without complication. Please see the operative note below. Following the procedure he separated from cardiopulmonary bypass without difficulty and was transferred to the ICU in stable condition. He did not require any inotropic support. He was weaned form the ventilator and extubated by 7pm on the day of surgery. The chest tubes and monitoring lines were removed on the first postop day and he was mobilized. Diuresis was begun for expected acute volume excess.  Glucose was initially managed with an insulin drip with standard protocols.  Patient was later converted to daily Levemir and sliding scale insulin.  On postop day 1, he was noted to have some EKG changes suggestive of ischemia.  Cardiology team ordered high-sensitivity troponin which was elevated in excess of 25,000.  From a surgical standpoint, we found this difficult to interpret given the fact that the coronary bypass procedure required dissecting out intramyocardial segments of the target coronaries.  Timothy Golden remained stable hemodynamically.  He was transferred to 4 E., progressive care, on postop day  2.  His cardiac rhythm remained stable and the pacing wires were removed routinely on POD-3. He regained independence with mobility and was weaned from the supplemental O2 with no trouble. He developed pain and  limited ROM in his left shoulder after surgery.  This was felt to be related to his extremely tight thoracic skeletal structure encountered at the time of surgery. A shoulder x-ray was unrevealing. We requested PT and OT evaluations to assist with management.  They recommended outpatient follow up. He is ambulating without difficulty.  His surgical incisions are healing without evidence of infection.  He is medically stable for discharge home today.   Significant Diagnostic Studies:  CTA neck: 1. 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 2. 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 Additional severe near occlusive stenosis at the origin of the left external carotid artery. 3. 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 Dominant right vertebral artery occludes just distal to its origin, with irregular distal reconstitution at the proximal V2 segment. Additional moderate to severe multifocal stenoses throughout the right V2 through V4 segments. 4. 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 PCAs not visualized on this exam.   EXAM: LEFT SHOULDER - 2+ VIEW  COMPARISON:  None.  FINDINGS: Frontal, oblique, Y scapular, and axillary images were obtained. No appreciable fracture or dislocation. There is mild narrowing of the glenohumeral joint. Acromioclavicular joint appears unremarkable. No erosive change. Visualized left lung clear.  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 No fracture or dislocation.   Electronically Signed   By: Bretta Bang III M.D.   On: 12/24/2020 09:53    Treatments:   CARDIOVASCULAR SURGERY OPERATIVE NOTE  12/20/2020  Surgeon:  Alleen Borne, MD  First Assistant: Jillyn Hidden,   PA-C   Preoperative Diagnosis:  Severe multi-vessel coronary artery disease   Postoperative Diagnosis:  Same   Procedure:  1. Median Sternotomy 2. Extracorporeal circulation 3.   Coronary artery bypass grafting x 4   Left internal mammary artery graft to the LAD  SVG to diagonal  SVG to Ramus  SVG to OM 4.   Endoscopic vein harvest from the right leg   Anesthesia:  General Endotracheal   Clinical History/Surgical Indication:  This 55 year old gentleman with type 1 diabetes and dyslipidemia presents with a non-ST segment elevation MI. Cardiac catheterization shows the LAD to be occluded at the ostium with faint right to left collaterals. The distal left main has about 60% stenosis and divides into a large ramus branch that has 75% ostial stenosis and a left circumflex that has 60% ostial stenosis before a large marginal branch.Left ventricular ejection fraction is visually 35 to 45% with mildly elevated LVEDP of 18. I agree that coronary artery bypass graft surgery is the best treatment for this patient.I discussed the operative procedure with the patient andhis wifeincluding alternatives, benefits and risks; including but not limited to bleeding, blood transfusion, infection, stroke, myocardial infarction, graft failure, heart block requiring a permanent pacemaker, organ dysfunction, and death. Timothy Chaseunderstands and agrees to proceed.   Discharge Exam: Blood pressure (!) 152/60, pulse 96, temperature 98.5 F (36.9 C), temperature source Oral, resp. rate 20, height 6' (1.829 m), weight (!) 136.4 kg, SpO2 97 %.  General appearance: alert, cooperative and no distress Heart: regular rate and rhythm Lungs: clear to auscultation bilaterally  Abdomen: soft, non-tender; bowel sounds normal; no masses,  no organomegaly Extremities: edema trace Wound: clean and dry   Discharge disposition: 01-Home or Self Care   Discharge Instructions    Amb Referral  to Cardiac Rehabilitation   Complete by: As directed    Diagnosis:  CABG NSTEMI     CABG X ___: 4   After initial evaluation and assessments completed: Virtual Based Care may be provided alone or in conjunction with Phase 2 Cardiac Rehab based on patient barriers.: Yes     Allergies as of 12/25/2020      Reactions   Ace Inhibitors    Other reaction(s): Cough (ALLERGY/intolerance)   Shellfish Allergy Rash   hives      Medication List    STOP taking these medications   atenolol 100 MG tablet Commonly known as: TENORMIN   diltiazem 240 MG 24 hr capsule Commonly known as: CARDIZEM CD   hydrochlorothiazide 25 MG tablet Commonly known as: HYDRODIURIL     TAKE these medications   acetaminophen 325 MG tablet Commonly known as: TYLENOL Take 2 tablets (650 mg total) by mouth every 6 (six) hours as needed for mild pain.   aspirin 81 MG EC tablet Take 81 mg by mouth daily.   atorvastatin 80 MG tablet Commonly known as: LIPITOR Take 1 tablet (80 mg total) by mouth daily. Start taking on: Dec 26, 2020 What changed:   medication strength  how much to take   clopidogrel 75 MG tablet Commonly known as: PLAVIX Take 1 tablet (75 mg total) by mouth daily. Start taking on: Dec 26, 2020   Dexcom G6 Sensor Misc Apply 1 sensor to the skin every 10 days for continuous glucose monitoring.   HumaLOG 100 UNIT/ML injection Generic drug: insulin lispro Inject into the skin 3 (three) times daily with meals. Sliding Scale   losartan 25 MG tablet Commonly known as: COZAAR Take 1 tablet (25 mg total) by mouth daily. What changed:   medication strength  how much to take   metFORMIN 500 MG 24 hr tablet Commonly known as: GLUCOPHAGE-XR Take 1,000 mg by mouth 2 (two) times daily.   metoprolol tartrate 25 MG tablet Commonly known as: LOPRESSOR Take 1 tablet (25 mg total) by mouth 2 (two) times daily.   NovoLOG 100 UNIT/ML injection Generic drug: insulin aspart Inject 10-30 Units  into the skin 3 (three) times daily with meals. Sliding Scale   Toujeo Max SoloStar 300 UNIT/ML Solostar Pen Generic drug: insulin glargine (2 Unit Dial) Inject into the skin.   traMADol 50 MG tablet Commonly known as: ULTRAM Take 1-2 tablets (50-100 mg total) by mouth every 4 (four) hours as needed for moderate pain.   Evaristo Bury FlexTouch 200 UNIT/ML FlexTouch Pen Generic drug: insulin degludec Inject 50 Units into the skin in the morning.       Follow-up Information    [facility] [place]. Go on 01/03/2021.   Specialty: Cardiothoracic Surgery Why: Your appointment for suture removal is at 11:00am. Contact information: [address] [place], [address] [place] [address] [phone]       Marcelino Duster, [place]. Go on 01/11/2021.   Specialties: Physician Assistant, Cardiology, Radiology Why: Your appointment is at 10:45am. Contact information: [address] [place] [address] [phone]        Alleen Borne, MD. Go on 01/19/2021.   Specialty: Cardiothoracic Surgery Why: Your appointment is at 9:00am.  Please arrive 30 minutes early for  a chest x-ray to be performed by [facility] located on the first floor of the same building.  Contact information: [address] [employer] [address] [place] [address] [phone]               The patient has been discharged on:   1.Beta Blocker:  Yes [ X  ]                              No   [   ]                              If No, reason:  2.Ace Inhibitor/ARB: Yes [  X ]                                     No  [    ]                                     If No, reason:  3.Statin:   Yes Arly.Keller   ]                  No  [   ]                  If No, reason:  4.Ecasa:  Yes  Arly.Keller   ]                  No   [   ]                  If No, reason:     Signed:  Gearl Kimbrough, PA-C 12/25/2020, 11:30 AM

## 2020-12-24 NOTE — Progress Notes (Signed)
Plan of care reviewed. Pt is progressing. He is hemodynamically stable. No acute distress noted. Sternal incision is dry and clean, no drainage. Pt ambulated in room and hall way. Pain tolerated well. We will continue to monitor.  Filiberto Pinks, RN

## 2020-12-24 NOTE — Progress Notes (Addendum)
      [address]       [place] [address]             [phone]      4 Days Post-Op Procedure(s) (LRB): CORONARY ARTERY BYPASS GRAFTING (CABG) x FOUR, USING LEFT INTERNAL MAMMARY ARTERY AND RIGHT LEG GREATER SAPHENOUS VEIN HARVESTED ENDOSCOPICALLY (N/A) TRANSESOPHAGEAL ECHOCARDIOGRAM (TEE) (N/A) Subjective: Awake and alert, resting in bed. Said he didn't sleep well last night. Still c/o pain and reduced motion in the left shoulder.  Doing well with ambulation. No BM yet.   Objective: Vital signs in last 24 hours: Temp:  [98 F (36.7 C)-99.4 F (37.4 C)] 99.2 F (37.3 C) (05/06 0342) Pulse Rate:  [85-109] 85 (05/06 0342) Cardiac Rhythm: Sinus tachycardia (05/05 1900) Resp:  [18-20] 18 (05/06 0342) BP: (112-149)/(51-76) 129/59 (05/06 0342) SpO2:  [93 %-99 %] 95 % (05/06 0342) Weight:  [137.1 kg] 137.1 kg (05/06 0342)  Hemodynamic parameters for last 24 hours:    Intake/Output from previous day: 05/05 0701 - 05/06 0700 In: 730 [P.O.:730] Out: 500 [Urine:500] Intake/Output this shift: No intake/output data recorded.  General appearance: alert, cooperative and mild distress Neurologic: intact Heart: SR, no significant arrhythmias Lungs: clear to auscultation bilaterally Abdomen: soft, NT, active bowel sounds Extremities: The RLE EVH incision is intact and dry.  Wound: The sternal incision is open to air and is intact and dry. Pacer wires are out.   Lab Results: Recent Labs    12/22/20 0337 12/23/20 0146  WBC 13.9* 11.8*  HGB 11.7* 11.2*  HCT 36.5* 34.4*  PLT 227 228   BMET:  Recent Labs    12/22/20 0337 12/23/20 0146  NA 132* 133*  K 4.4 3.9  CL 102 100  CO2 26 27  GLUCOSE 247* 219*  BUN 19 21*  CREATININE 0.91 0.76  CALCIUM 8.1* 8.2*    PT/INR: No results for input(s): LABPROT, INR in the last 72 hours. ABG    Component Value Date/Time   PHART 7.356 12/20/2020 1958   HCO3 20.8 12/20/2020 1958   TCO2 22 12/20/2020 1958    ACIDBASEDEF 4.0 (H) 12/20/2020 1958   O2SAT 99.0 12/20/2020 1958   CBG (last 3)  Recent Labs    12/23/20 1651 12/23/20 2122 12/24/20 0608  GLUCAP 335* 190* 104*    Assessment/Plan: S/P Procedure(s) (LRB): CORONARY ARTERY BYPASS GRAFTING (CABG) x FOUR, USING LEFT INTERNAL MAMMARY ARTERY AND RIGHT LEG GREATER SAPHENOUS VEIN HARVESTED ENDOSCOPICALLY (N/A) TRANSESOPHAGEAL ECHOCARDIOGRAM (TEE) (N/A)  -POD-4 CABG x 4, normal EF. Progressing well. VS and cardiac rhythm stable.  Continue ASA, beta blocker, statin. Adding Plavix today per cardiology rec's.  -Volume excess- diuresing well, Wt down to within 3lbs of pre-op.    -Left shoulder discomfort- will ask PT to eval and assist with ROM.  -Endo- Type 2DM- glucos still in the 300's part of the day yesterday despite increasing insulin. Metformin has been resumed. Continue SSI.   -DVT PPX- on daily [place] enoxaparin.     LOS: 7 days    Leary Roca, Cordelia Poche [phone] 12/24/2020   Chart reviewed, patient examined, agree with above. I ordered X-ray to evaluate left shoulder to make sure there is not obvious abnormality like dislocation, although I doubt that. PT consulted for help with exercises to increase shoulder mobility. I suspect this is related to his large upper body size since it took a lot of force to open his chest with a retractor.

## 2020-12-25 LAB — GLUCOSE, CAPILLARY
Glucose-Capillary: 87 mg/dL (ref 70–99)
Glucose-Capillary: 142 mg/dL — ABNORMAL HIGH (ref 70–99)

## 2020-12-25 LAB — BASIC METABOLIC PANEL
Anion gap: 8 (ref 5–15)
BUN: 16 mg/dL (ref 6–20)
CO2: 25 mmol/L (ref 22–32)
Calcium: 8.3 mg/dL — ABNORMAL LOW (ref 8.9–10.3)
Chloride: 102 mmol/L (ref 98–111)
Creatinine, Ser: 0.75 mg/dL (ref 0.61–1.24)
GFR, Estimated: >60 mL/min (ref >60)
Glucose, Bld: 114 mg/dL — ABNORMAL HIGH (ref 70–99)
Potassium: 3.6 mmol/L (ref 3.5–5.1)
Sodium: 135 mmol/L (ref 135–145)

## 2020-12-25 MED ORDER — METOPROLOL TARTRATE 25 MG PO TABS: 25.0000 mg | ORAL_TABLET | Freq: Two times a day (BID) | ORAL | 3 refills | Status: DC

## 2020-12-25 MED ORDER — LOSARTAN POTASSIUM 25 MG PO TABS
25.0000 mg | ORAL_TABLET | Freq: Every day | ORAL | Status: DC
  Administered 2020-12-25: 25 mg via ORAL
  Filled 2020-12-25: qty 1

## 2020-12-25 MED ORDER — ACETAMINOPHEN 325 MG PO TABS: 650.0000 mg | ORAL_TABLET | Freq: Four times a day (QID) | ORAL | Status: AC | PRN

## 2020-12-25 MED ORDER — TRAMADOL HCL 50 MG PO TABS: 50.0000 mg | ORAL_TABLET | ORAL | 0 refills | Status: DC | PRN

## 2020-12-25 MED ORDER — CLOPIDOGREL BISULFATE 75 MG PO TABS: 75.0000 mg | ORAL_TABLET | Freq: Every day | ORAL | 3 refills | Status: DC

## 2020-12-25 MED ORDER — LOSARTAN POTASSIUM 25 MG PO TABS: 25.0000 mg | ORAL_TABLET | Freq: Every day | ORAL | 3 refills | Status: DC

## 2020-12-25 MED ORDER — ATORVASTATIN CALCIUM 80 MG PO TABS: 80.0000 mg | ORAL_TABLET | Freq: Every day | ORAL | 3 refills | Status: DC

## 2020-12-25 NOTE — TOC Transition Note (Signed)
Transition of Care [facility]) - CM/SW Discharge Note   Patient Details  Name: Brandis Matsuura MRN: [id number] Date of Birth: 09/07/1965  Transition of [facility]) CM/SW Contact:  Norvel Richards, RN Phone Number: 12/25/2020, 10:58 AM   Clinical Narrative:   [facility] team for discharge planning. Spoke to spouse- Lauren about discharge plan. She is in agreement with the current plan of out patient PT. Discussed DME- 3 in 1 bedside commode. Lauren requests DME be delivered to the home. Call to [employer] to ordered 3 in 1 with home delivery. Spoke to [phone]. Received call from Lowella Dandy PA to discuss discharge plan home health vs outpatient. OT's recommendation is for outpatient services. Spoke to patient via telephone to discuss discharge plan, DME equipment and plan for outpatient OT. Patient shares that he is unable to drive. Discussed that wife has agreed to take him to appointments. Discussed the importance of following through with therapy to regain function of the shoulder. Pt voices understanding and agrees with discharge plan.   Final next level of care: OP Rehab (Outpatient OT.) Barriers to Discharge: No Barriers Identified   Patient Goals and CMS Choice Patient states their goals for this hospitalization and ongoing recovery are:: To return home to family.      Discharge Placement                       Discharge Plan and Services                DME Arranged: 3-N-1 DME Agency: [employer] Date DME Agency Contacted: 12/25/20 Time DME Agency Contacted: 1045 Representative spoke with at DME Agency: Eber [phone]            Social Determinants of Health (SDOH) Interventions     Readmission Risk Interventions No flowsheet data found.

## 2020-12-25 NOTE — Progress Notes (Signed)
Occupational Therapy Treatment Patient Details Name: Timothy Golden MRN: [id number] DOB: October 21, 1965 Today's Date: 12/25/2020    History of present illness 55 yo admitted 4/28 with chest pain and CAD. 5/2 CABG x 4. PMhx: DM, HTN, HLD, obesity   OT comments  Pt provided with HEP for Lt UE and was able to return demonstration.  He was also instructed in sensory loss precautions for Lt hand.    Follow Up Recommendations  Outpatient OT    Equipment Recommendations  Other (comment) (AE)    Recommendations for Other Services      Precautions / Restrictions Precautions Precautions: Sternal Precaution Booklet Issued: No Precaution Comments: pt reports he has handout, able to verbalize sternal precautions Restrictions Weight Bearing Restrictions: Yes RUE Weight Bearing: Non weight bearing LUE Weight Bearing: Non weight bearing       Mobility Bed Mobility                    Transfers                      Balance                                           ADL either performed or assessed with clinical judgement   ADL                                               Vision       Perception     Praxis      Cognition Arousal/Alertness: Awake/alert Behavior During Therapy: WFL for tasks assessed/performed Overall Cognitive Status: Within Functional Limits for tasks assessed                                          Exercises Exercises: Other exercises Other Exercises Other Exercises: Pt performed 10 reps AAROM shoulder flexion (70-85*) x 10 reps.  He performed scapular adduction x 10 reps. Other Exercises: He was provided with HEP via [employer] [email or web address] and was able to return demonstration Other Exercises: Instructed pt on sensory precautions for Lt hand   Shoulder Instructions       General Comments      Pertinent Vitals/ Pain       Pain Assessment: Faces Pain Score: 4  Pain Location: Lt  shoulder with AROM Pain Descriptors / Indicators: Cramping;Heaviness;Sharp Pain Intervention(s): Monitored during session;Limited activity within patient's tolerance  Home Living                                          Prior Functioning/Environment              Frequency           Progress Toward Goals  OT Goals(current goals can now be found in the care plan section)  Progress towards OT goals: Progressing toward goals     Plan Discharge plan remains appropriate    Co-evaluation                 AM-PAC OT "6  Clicks" Daily Activity     Outcome Measure   Help from another person eating meals?: None Help from another person taking care of personal grooming?: None Help from another person toileting, which includes using toliet, bedpan, or urinal?: A Little Help from another person bathing (including washing, rinsing, drying)?: A Little Help from another person to put on and taking off regular upper body clothing?: A Little Help from another person to put on and taking off regular lower body clothing?: A Little 6 Click Score: 20    End of Session    OT Visit Diagnosis: Muscle weakness (generalized) (M62.81);Pain Pain - Right/Left: Left Pain - part of body: Shoulder   Activity Tolerance Patient tolerated treatment well   Patient Left in chair;with call bell/phone within reach;with nursing/sitter in room   Nurse Communication Mobility status        Time: 4259-5638 OT Time Calculation (min): 21 min  Charges: OT General Charges $OT Visit: 1 Visit OT Treatments $Therapeutic Exercise: 8-22 mins  Eber Jones., OTR/L Acute Rehabilitation Services Pager [phone] Office [phone]    Jeani Hawking M 12/25/2020, 1:08 PM

## 2020-12-25 NOTE — Progress Notes (Signed)
Progress Note  Patient Name: Timothy Golden Date of Encounter: 12/25/2020   Arsenio Loader, [facility]  PCP  Primary Cardiologist:   Lance Muss, MD   Subjective   Incisional soreness but otherwise OK.    Inpatient Medications    Scheduled Meds: . aspirin EC  81 mg Oral Daily  . atorvastatin  80 mg Oral Daily  . Chlorhexidine Gluconate Cloth  6 each Topical Daily  . clopidogrel  75 mg Oral Daily  . enoxaparin (LOVENOX) injection  40 mg Subcutaneous QHS  . insulin aspart  0-24 Units Subcutaneous TID AC & HS  . insulin aspart  6 Units Subcutaneous TID WC  . insulin detemir  40 Units Subcutaneous BID  . losartan  25 mg Oral Daily  . metFORMIN  1,000 mg Oral BID WC  . metoprolol tartrate  25 mg Oral BID  . pantoprazole  40 mg Oral QAC breakfast  . sodium chloride flush  3 mL Intravenous Q12H   Continuous Infusions: . sodium chloride     PRN Meds: sodium chloride, acetaminophen, ondansetron **OR** ondansetron (ZOFRAN) IV, oxyCODONE, senna-docusate, sodium chloride flush, traMADol   Vital Signs    Vitals:   12/24/20 1948 12/24/20 2333 12/25/20 0338 12/25/20 0825  BP: 130/60 119/74 (!) 141/78 132/63  Pulse: 94 84 99 99  Resp: 20 16 16 20   Temp: 98 F (36.7 C) 98 F (36.7 C) 97.7 F (36.5 C) 98.8 F (37.1 C)  TempSrc: Oral Oral Oral Oral  SpO2: 95% 92% 96% 93%  Weight:   (!) 136.4 kg   Height:        Intake/Output Summary (Last 24 hours) at 12/25/2020 0958 Last data filed at 12/24/2020 1720 Gross per 24 hour  Intake 600 ml  Output --  Net 600 ml   Filed Weights   12/23/20 0326 12/24/20 0342 12/25/20 0338  Weight: (!) 140.3 kg (!) 137.1 kg (!) 136.4 kg    Telemetry    NSR - Personally Reviewed  ECG    NA - Personally Reviewed  Physical Exam   GEN: No acute distress.   Neck: No  JVD Cardiac: RRR, no murmurs, rubs, or gallops.  Respiratory: Clear  to auscultation bilaterally. GI: Soft, nontender, non-distended  MS:      Trace edema; No  deformity. Neuro:  Nonfocal  Psych: Normal affect   Labs    Chemistry Recent Labs  Lab 12/20/20 0620 12/20/20 0825 12/22/20 0337 12/23/20 0146 12/25/20 0137  NA 140   < > 132* 133* 135  K 3.7   < > 4.4 3.9 3.6  CL 105   < > 102 100 102  CO2 25   < > 26 27 25   GLUCOSE 134*   < > 247* 219* 114*  BUN 15   < > 19 21* 16  CREATININE 0.92   < > 0.91 0.76 0.75  CALCIUM 9.0   < > 8.1* 8.2* 8.3*  PROT 6.3*  --   --   --   --   ALBUMIN 3.5  --   --   --   --   AST 41  --   --   --   --   ALT 29  --   --   --   --   ALKPHOS 88  --   --   --   --   BILITOT 0.9  --   --   --   --   GFRNONAA >60   < > >  60 >60 >60  ANIONGAP 10   < > 4* 6 8   < > = values in this interval not displayed.     Hematology Recent Labs  Lab 12/21/20 1700 12/22/20 0337 12/23/20 0146  WBC 16.3* 13.9* 11.8*  RBC 4.34 4.22 4.06*  HGB 12.1* 11.7* 11.2*  HCT 37.9* 36.5* 34.4*  MCV 87.3 86.5 84.7  MCH 27.9 27.7 27.6  MCHC 31.9 32.1 32.6  RDW 14.0 14.4 14.3  PLT 224 227 228   Lab Results  Component Value Date   CHOL 156 12/17/2020   TRIG 94 12/17/2020   HDL 34 (L) 12/17/2020   LDLCALC 103 (H) 12/17/2020    Cardiac EnzymesNo results for input(s): TROPONINI in the last 168 hours. No results for input(s): TROPIPOC in the last 168 hours.   BNPNo results for input(s): BNP, PROBNP in the last 168 hours.   DDimer No results for input(s): DDIMER in the last 168 hours.   Radiology    DG Shoulder Left  Result Date: 12/24/2020 CLINICAL DATA:  Pain EXAM: LEFT SHOULDER - 2+ VIEW COMPARISON:  None. FINDINGS: Frontal, oblique, Y scapular, and axillary images were obtained. No appreciable fracture or dislocation. There is mild narrowing of the glenohumeral joint. Acromioclavicular joint appears unremarkable. No erosive change. Visualized left lung clear. 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 No fracture or dislocation. Electronically Signed   By: Bretta Bang III M.D.   On:  12/24/2020 09:53    Cardiac Studies   ECHO:  1. Left ventricular ejection fraction, by estimation, is 60 to 65%. The  left ventricle has normal function. The left ventricle has no regional  wall motion abnormalities. Left ventricular diastolic parameters are  consistent with Grade II diastolic  dysfunction (pseudonormalization). Elevated left ventricular end-diastolic  pressure.  2. Right ventricular systolic function is normal. The right ventricular  size is normal. There is normal pulmonary artery systolic pressure.  3. Left atrial size was moderately dilated.  4. The mitral valve is normal in structure. Trivial mitral valve  regurgitation. No evidence of mitral stenosis.  5. The aortic valve is tricuspid. Aortic valve regurgitation is not  visualized. No aortic stenosis is present.  6. Aortic dilatation noted. 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  7. The inferior vena cava is normal in size with greater than 50%  respiratory variability, suggesting right atrial pressure of 3 mmHg.   Patient Profile     54 y.o. male with PMH of HTN, HLD, DM and obesity who presented [facility] ED with chest pain. 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 TCTS consulted and underwent CABG.  Assessment & Plan    CAD/CABG:    Plavix added per GDMT yesterday.    Follow up scheduled.     CAROTID STENOSIS:  Needs follow up with [facility].  I will send [facility] a reminder.    DM:  A1c is 8.3.    He does have a PCP in [place] who is actively managing this.     CARDIOLOGY RECOMMENDATIONS:  Discharge is anticipated today. Recommendations for medications and follow up:  Discharge Medications: Continue medications as they are currently listed in the [facility]. Exceptions to the above:  Agree with MAR and plans for restarting low dose Cozaar  Follow Up: The patient's Primary Cardiologist is Lance Muss, MD   Follow up in the office is scheduled.   Signed,   Rollene Rotunda, MD  10:05 AM 12/25/2020  [facility]   For questions or updates, please contact [facility] Please consult [email or web address] for contact info under Cardiology/STEMI.   Signed, Rollene Rotunda, MD  12/25/2020, 9:58 AM

## 2020-12-25 NOTE — Progress Notes (Signed)
Discharge instructions provided to patient. All medications, follow up appointments, and discharge instructions provided. IV out. Monitor off CCMD notified. Discharging home with wife.  Leanda Padmore R Tasmia Blumer, RN  

## 2020-12-25 NOTE — Progress Notes (Addendum)
      [address]       [place] [address]             [phone]      5 Days Post-Op Procedure(s) (LRB): CORONARY ARTERY BYPASS GRAFTING (CABG) x FOUR, USING LEFT INTERNAL MAMMARY ARTERY AND RIGHT LEG GREATER SAPHENOUS VEIN HARVESTED ENDOSCOPICALLY (N/A) TRANSESOPHAGEAL ECHOCARDIOGRAM (TEE) (N/A)   Subjective:  No new complaints.  Continues to have right sided shoulder pain.  Objective: Vital signs in last 24 hours: Temp:  [97.7 F (36.5 C)-99.5 F (37.5 C)] 98.8 F (37.1 C) (05/07 0825) Pulse Rate:  [84-99] 99 (05/07 0825) Cardiac Rhythm: Sinus tachycardia;Bundle branch block (05/07 0847) Resp:  [16-20] 20 (05/07 0825) BP: (115-141)/(60-78) 132/63 (05/07 0825) SpO2:  [92 %-98 %] 93 % (05/07 0825) Weight:  [136.4 kg] 136.4 kg (05/07 0338)  Intake/Output from previous day: 05/06 0701 - 05/07 0700 In: 840 [P.O.:840] Out: -   General appearance: alert, cooperative and no distress Heart: regular rate and rhythm Lungs: clear to auscultation bilaterally Abdomen: soft, non-tender; bowel sounds normal; no masses,  no organomegaly Extremities: edema trace Wound: clean and dry  Lab Results: Recent Labs    12/23/20 0146  WBC 11.8*  HGB 11.2*  HCT 34.4*  PLT 228   BMET:  Recent Labs    12/23/20 0146 12/25/20 0137  NA 133* 135  K 3.9 3.6  CL 100 102  CO2 27 25  GLUCOSE 219* 114*  BUN 21* 16  CREATININE 0.76 0.75  CALCIUM 8.2* 8.3*    PT/INR: No results for input(s): LABPROT, INR in the last 72 hours. ABG    Component Value Date/Time   PHART 7.356 12/20/2020 1958   HCO3 20.8 12/20/2020 1958   TCO2 22 12/20/2020 1958   ACIDBASEDEF 4.0 (H) 12/20/2020 1958   O2SAT 99.0 12/20/2020 1958   CBG (last 3)  Recent Labs    12/24/20 1618 12/24/20 2107 12/25/20 0621  GLUCAP 298* 239* 87    Assessment/Plan: S/P Procedure(s) (LRB): CORONARY ARTERY BYPASS GRAFTING (CABG) x FOUR, USING LEFT INTERNAL MAMMARY ARTERY AND RIGHT LEG GREATER  SAPHENOUS VEIN HARVESTED ENDOSCOPICALLY (N/A) TRANSESOPHAGEAL ECHOCARDIOGRAM (TEE) (N/A)  1. CV- NSR, BP is elevated at times- continue Lopressor, will restart home Cozaar at reduced dose 2. Pulm- no acute issues, off oxygen, continue IS 3. Renal- creatinine WNL, weight is at baseline, Lasix ordered and has been completed 4. DM-sugars remain elevated at times, will resume all home medications at discharge 5. R shoulder pain- PT eval recs home PT, orders placed 6. Dispo- patient stable, will resume home Cozaar at reduced dose, plan to d/c home today   LOS: 8 days    Lowella Dandy, PA-C 12/25/2020    I have seen and examined the patient and agree with the assessment and plan as outlined.  Purcell Nails, MD 12/25/2020 12:20 PM

## 2020-12-28 ENCOUNTER — Other Ambulatory Visit: Payer: Self-pay | Admitting: *Deleted

## 2020-12-28 DIAGNOSIS — Z951 Presence of aortocoronary bypass graft: Secondary | ICD-10-CM

## 2020-12-28 DIAGNOSIS — M25512 Pain in left shoulder: Secondary | ICD-10-CM

## 2020-12-28 NOTE — Progress Notes (Signed)
Patient contacted the office with questions regarding PT/OT in the outpatient setting. Patient states he is slowly regaining ROM in his left shoulder however he would still like to go through therapy for his left shoulder pain. Per Jaclyn Prime, PA, referral placed for PT/OT in outpatient setting.

## 2020-12-31 ENCOUNTER — Telehealth (HOSPITAL_COMMUNITY): Payer: Self-pay

## 2020-12-31 NOTE — Telephone Encounter (Signed)
Called patient to see if he is interested in the Cardiac Rehab Program. Patient expressed interest. Explained scheduling process and went over insurance, patient verbalized understanding. Will contact patient for scheduling once f/u has been completed.  °

## 2020-12-31 NOTE — Telephone Encounter (Signed)
Pt insurance is active and benefits verified through [employer] [employer], DED $1,750/$183.67 met, out of pocket $6,850/$415.15 met, co-insurance 20%. NO pre-authorization required. Passport, 12/31/2020'@3' :55PM, REF# [id number]  Will contact patient to see if he is interested in the Cardiac Rehab Program. If interested, patient will need to complete follow up appt. Once completed, patient will be contacted for scheduling upon review by the RN Navigator.

## 2021-01-03 ENCOUNTER — Ambulatory Visit (INDEPENDENT_AMBULATORY_CARE_PROVIDER_SITE_OTHER): Payer: Self-pay

## 2021-01-03 ENCOUNTER — Other Ambulatory Visit: Payer: Self-pay

## 2021-01-03 DIAGNOSIS — Z951 Presence of aortocoronary bypass graft: Secondary | ICD-10-CM

## 2021-01-03 DIAGNOSIS — Z4802 Encounter for removal of sutures: Secondary | ICD-10-CM

## 2021-01-03 NOTE — Progress Notes (Signed)
Removed 3 sutures from chest tube incision sites with no signs of infection and patient tolerated well.  

## 2021-01-04 ENCOUNTER — Ambulatory Visit: Payer: 59 | Attending: Surgery | Admitting: Occupational Therapy

## 2021-01-04 DIAGNOSIS — R278 Other lack of coordination: Secondary | ICD-10-CM | POA: Diagnosis present

## 2021-01-04 DIAGNOSIS — M25512 Pain in left shoulder: Secondary | ICD-10-CM | POA: Insufficient documentation

## 2021-01-04 DIAGNOSIS — R208 Other disturbances of skin sensation: Secondary | ICD-10-CM | POA: Diagnosis present

## 2021-01-04 DIAGNOSIS — M6281 Muscle weakness (generalized): Secondary | ICD-10-CM | POA: Diagnosis not present

## 2021-01-04 NOTE — Therapy (Signed)
[facility] [address] [place], [place], [address] Phone: [phone]   Fax:  [phone]  Occupational Therapy Evaluation  Patient Details  Name: Timothy Golden MRN: [id number] Date of Birth: [birth year] Referring Provider (OT): Rexanne ManoBrian Bartle (cardiac surgeon)   Encounter Date: 01/04/2021   OT End of Session - 01/04/21 0859    Visit Number 1    Number of Visits 13    Date for OT Re-Evaluation 02/18/21    Authorization Type UHC    OT Start Time 0755    OT Stop Time 0845    OT Time Calculation (min) 50 min    Activity Tolerance Patient tolerated treatment well    Behavior During Therapy [facility] for tasks assessed/performed           Past Medical History:  Diagnosis Date  . Diabetes mellitus without complication (HCC)   . HLD (hyperlipidemia)   . Hypertension   . Obesity   . Stroke [facility](HCC)     Past Surgical History:  Procedure Laterality Date  . CORONARY ARTERY BYPASS GRAFT N/A 12/20/2020   Procedure: CORONARY ARTERY BYPASS GRAFTING (CABG) x FOUR, USING LEFT INTERNAL MAMMARY ARTERY AND RIGHT LEG GREATER SAPHENOUS VEIN HARVESTED ENDOSCOPICALLY;  Surgeon: Alleen BorneBartle, Bryan K, MD;  Location: [facility] OR;  Service: Open Heart Surgery;  Laterality: N/A;  . EYE SURGERY    . LEFT HEART CATH AND CORONARY ANGIOGRAPHY N/A 12/16/2020   Procedure: LEFT HEART CATH AND CORONARY ANGIOGRAPHY;  Surgeon: Corky CraftsVaranasi, Jayadeep S, MD;  [facility];  Service: Cardiovascular;  Laterality: N/A;  . SHOULDER SURGERY    . TEE WITHOUT CARDIOVERSION N/A 12/20/2020   Procedure: TRANSESOPHAGEAL ECHOCARDIOGRAM (TEE);  Surgeon: Alleen BorneBartle, Bryan K, MD;  [facility];  Service: Open Heart Surgery;  Laterality: N/A;    There were no vitals filed for this visit.   Subjective Assessment - 01/04/21 0758    Subjective  Pt reports numbness with ring and small fingers Lt hand since surgery. Pt reports shoulder is slowly improving but more frustrated w/  hand at this time.    Pertinent History 12/16/20 presented to ED with NSTEMI, s/p CABG x 4 on 12/20/20 w/ new Lt shoulder pain. PMH: DM type I, HTN, HLD, CAD, ?CVA, Rt frozen shoulder    Limitations Sternal precautions, no lifting > 8 lbs    Currently in Pain? Yes    Pain Score --   2/10 at rest because he doesn't use often   Pain Location Shoulder    Pain Orientation Left             [facility] OT Assessment - 01/04/21 0001      Assessment   Medical Diagnosis s/p CABG X 4, Lt shoulder pain   NSTEMI   Referring Provider (OT) Rexanne ManoBrian Bartle (cardiac surgeon)    Onset Date/Surgical Date 12/20/20   Surgery date (NSTEMI 12/16/20)   Hand Dominance Right    Next MD Visit 01/19/21      Precautions   Precautions Sternal    Precaution Comments Sternal precautions, no lifting > 8 lbs      Restrictions   Weight Bearing Restrictions Yes    RUE Weight Bearing Non weight bearing    LUE Weight Bearing Non weight bearing      Home  Environment   Bathroom Shower/Tub Walk-in Shower    Additional Comments Pt lives in 1 story home with 1 step to enter    Lives With Spouse   and  9 y.o.     Prior Function   Level of Independence Independent    Vocation Full time employment    Vocation Requirements [employer]    Leisure wood working      ADL   Eating/Feeding Independent    Grooming Modified independent    Product manager Modified independent   DIFFICULTY WASHING HAIR   Lower Body Bathing Modified independent    Upper Body Dressing Independent    Lower Body Dressing Modified independent    Lobbyist -  Hygiene Modified Independent    Tub/Shower Transfer Modified independent    ADL comments Typing and in hand manipulation difficult d/t numbness along ulnar n. distribution      IADL   Shopping --   Wife mostly does   Light Housekeeping --   not doing currently, wife and pt shared prior to CABG   Meal Prep --   Wife currently doing, pt has done some light cooking    Medication Management Is responsible for taking medication in correct dosages at correct time      Mobility   Mobility Status Independent      Written Expression   Dominant Hand Right    Handwriting --   denies change     Vision - History   Baseline Vision Wears glasses only for reading      Activity Tolerance   Activity Tolerance Comments increased fatigue, sleeps more      Cognition   Overall Cognitive Status Within Functional Limits for tasks assessed      Observation/Other Assessments   Observations Pt Rt handed but due to previous RT frozen shoulder, pt learned to use Lt hand/UE more.      Sensation   Light Touch Impaired by gross assessment    Additional Comments dimininshed ring and small finger Lt hand, no distinction b/t sharp/dull and no stereognosis in those fingers      Coordination   9 Hole Peg Test Right;Left    Right 9 Hole Peg Test 31.94 sec    Left 9 Hole Peg Test 28.75 sec    Coordination in hand manipulation impaired Lt hand due to numbness ring and small finger and weakness      Edema   Edema none      ROM / Strength   AROM / PROM / Strength AROM      AROM   Overall AROM Comments bilateral shoulder flexion to approx 105-110* (pt reports previous Rt frozen shoulder and reports he is not flexible even prior to CABG). Unable to fully assess ER/IR due to sternal precautions. Abduction above 90* w/ opposite arm across chest when performing to adhere to precautions     Hand Function   Right Hand Grip (lbs) 100.5 lbs    Left Hand Grip (lbs) 50.4 lbs                           OT Education - 01/04/21 0850    Education Details sternal precautions, HEP following open heart surgery, Lt handed typing words and isolated finger ext (tapping) HEP    Person(s) Educated Patient    Methods Explanation;Demonstration;Verbal cues;Handout    Comprehension Verbalized understanding;Returned demonstration               OT Long Term Goals -  01/04/21 0905      OT LONG TERM GOAL #1   Title Independent with HEP for  Lt shoulder adhering to sternal precautions    Time 6    Period Weeks    Status New      OT LONG TERM GOAL #2   Title Independent with HEP for Lt hand (including in hand manipulation, putty HEP, and ulnar n. gliding)    Time 6    Period Weeks    Status New      OT LONG TERM GOAL #3   Title Pt to verbalize understanding with all sternal precautions and safety considerations due to lack of sensation Lt hand    Time 6    Period Weeks    Status New      OT LONG TERM GOAL #4   Title Pt to wash hair w/ greater ease and pain less than or equal to 1/10 (adhering to precautions)    Time 6    Period Weeks    Status New      OT LONG TERM GOAL #5   Title Pt to report greater ease typing and performing in hand manipulation tasks Lt hand    Time 6    Period Weeks    Status New      Long Term Additional Goals   Additional Long Term Goals Yes      OT LONG TERM GOAL #6   Title Pt to improve grip strength Lt hand by 10 lbs or greater    Baseline 50 lbs    Time 6    Period Weeks    Status New      OT LONG TERM GOAL #7   Title Pt to report greater ease cooking light meals with A/E prn    Time 6    Period Weeks    Status New                 Plan - 01/04/21 0900    Clinical Impression Statement Pt is a pleasant  55 y.o. male who presents to OPOT s/p CABG x 4 on 12/20/20 from NSTEMI on 12/16/20. Pt w/ sternal precautions, lifting restrictions, and new but improving Lt shoulder pain, and numbness Lt hand along ulnar n. distribution affecting in hand manipulation and grip strength. PMH includes: DM, HTN, HLD, CAD, Rt frozen shoulder. Pt will benefit from O.T. to address these deficits and improve overall function and endurance.    OT Occupational Profile and History Problem Focused Assessment - Including review of records relating to presenting problem    Occupational performance deficits (Please refer to  evaluation for details): ADL's;IADL's;Leisure;Work    Games developer / Function / Physical Skills ADL;Strength;Pain;UE functional use;ROM;IADL;Coordination;Decreased knowledge of precautions;FMC    Rehab Potential Excellent    Clinical Decision Making Several treatment options, min-mod task modification necessary    Comorbidities Affecting Occupational Performance: Presence of comorbidities impacting occupational performance    Comorbidities impacting occupational performance description: sternal precautions from CABG    Modification or Assistance to Complete Evaluation  No modification of tasks or assist necessary to complete eval    OT Frequency 2x / week    OT Duration 6 weeks   anticipate only 4 weeks   OT Treatment/Interventions Self-care/ADL training;Moist Heat;Fluidtherapy;DME and/or AE instruction;Therapeutic activities;Therapeutic exercise;Coping strategies training;Passive range of motion;Energy conservation;Patient/family education;Manual Therapy    Plan issue ulnar n. gliding ex's, in hand manipulation coordination HEP, putty HEP (following visit: shoulder HEP adhering to sternal precautions and safety considerations d/t lack of sensation)    Consulted and Agree with Plan of Care Patient  Patient will benefit from skilled therapeutic intervention in order to improve the following deficits and impairments:   Body Structure / Function / Physical Skills: ADL,Strength,Pain,UE functional use,ROM,IADL,Coordination,Decreased knowledge of precautions,FMC       Visit Diagnosis: Muscle weakness (generalized)  Acute pain of left shoulder  Other lack of coordination  Other disturbances of skin sensation    Problem List Patient Active Problem List   Diagnosis Date Noted  . Elevated troponin   . 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 Type 1 diabetes mellitus with other specified complication (HCC) 12/17/2020  . NSTEMI (non-ST elevated myocardial infarction) (HCC) 12/16/2020     Kelli Churn, OTR/L 01/04/2021, 9:20 AM  [facility] [address] [place], [place], [address] Phone: [phone]   Fax:  [phone]  Name: Timothy Golden MRN: [id number] Date of Birth: Dec 11, 1965

## 2021-01-10 ENCOUNTER — Ambulatory Visit: Payer: 59 | Admitting: Occupational Therapy

## 2021-01-10 NOTE — Progress Notes (Signed)
Cardiology Office Note:    Date:  01/11/2021   ID:  Timothy Golden, DOB 1965-10-01, MRN [id number]  PCP:  Raynelle Bring [facility] Referring MD: [place], [place] [facility]*    [facility]  Cardiologist:  Lance Muss, MD   Reason for visit: F/U CABG  History of Present Illness:    Timothy Golden is a 55 y.o. male with a hx of HTN, HLD, DM and obesity who presented to the ED 12/16/2020 with chest pain. Approximately 2 hours prior to arrival in the emergency department he had an episode of heaviness in his chest after getting up.  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  Pt underwent CABG x4 (LIMA to LAD, SVG-Diag, SVG-Ramus, SVG-OM). Preop studies showed EF 60-65% and bilateral ICA stenosis (60-70%).  Dr. Myra Gianotti (vascular) recommended f/u carotid duplex 6 months post CABG.  Today, he is here for CABG f/u.  He feels well.  He denies angina and shortness of breath.  No LE edema, PND, orthopnea, lightheadedness, syncope, palpitations or bleeding.  Denies TIA/CVA symptoms.  He is taking his medications without issue.  He states he is working better with his new endocrinologist.  He is considering cardiac rehab.  He is taking off work on short term disability until the end of June.  He mentions new erectile dysfunction.        Past Medical History:  Diagnosis Date  . Diabetes mellitus without complication (HCC)   . HLD (hyperlipidemia)   . Hypertension   . Obesity   . Stroke [facility])     Past Surgical History:  Procedure Laterality Date  . CORONARY ARTERY BYPASS GRAFT N/A 12/20/2020   Procedure: CORONARY ARTERY BYPASS GRAFTING (CABG) x FOUR, USING LEFT INTERNAL MAMMARY ARTERY AND RIGHT LEG GREATER SAPHENOUS VEIN HARVESTED ENDOSCOPICALLY;  Surgeon: Alleen Borne, MD;  Location: [facility] OR;  Service: Open Heart Surgery;  Laterality: N/A;  . EYE SURGERY    . LEFT HEART CATH AND CORONARY ANGIOGRAPHY N/A 12/16/2020   Procedure: LEFT HEART CATH AND  CORONARY ANGIOGRAPHY;  Surgeon: Corky Crafts, MD;  Location: [facility] INVASIVE CV LAB;  Service: Cardiovascular;  Laterality: N/A;  . SHOULDER SURGERY    . TEE WITHOUT CARDIOVERSION N/A 12/20/2020   Procedure: TRANSESOPHAGEAL ECHOCARDIOGRAM (TEE);  Surgeon: Alleen Borne, MD;  Location: [facility] OR;  Service: Open Heart Surgery;  Laterality: N/A;    Current Medications: Current Meds  Medication Sig  . acetaminophen (TYLENOL) 325 MG tablet Take 2 tablets (650 mg total) by mouth every 6 (six) hours as needed for mild pain.  Marland Kitchen aspirin 81 MG EC tablet Take 81 mg by mouth daily.  Marland Kitchen atorvastatin (LIPITOR) 80 MG tablet Take 1 tablet (80 mg total) by mouth daily.  . clopidogrel (PLAVIX) 75 MG tablet Take 1 tablet (75 mg total) by mouth daily.  Marland Kitchen HUMALOG 100 UNIT/ML injection Inject into the skin 3 (three) times daily with meals. Sliding Scale  . metFORMIN (GLUCOPHAGE-XR) 500 MG 24 hr tablet Take 1,000 mg by mouth 2 (two) times daily.  . metoprolol tartrate (LOPRESSOR) 25 MG tablet Take 1 tablet (25 mg total) by mouth 2 (two) times daily.  Marland Kitchen NOVOLOG 100 UNIT/ML injection Inject 10-30 Units into the skin 3 (three) times daily with meals. Sliding Scale  . TOUJEO MAX SOLOSTAR 300 UNIT/ML Solostar Pen Inject into the skin.  Marland Kitchen traMADol (ULTRAM) 50 MG tablet Take 1-2 tablets (50-100 mg total) by mouth every 4 (four) hours as needed  for moderate pain.  Evaristo Bury FLEXTOUCH 200 UNIT/ML FlexTouch Pen Inject 50 Units into the skin in the morning.  . [DISCONTINUED] losartan (COZAAR) 25 MG tablet Take 1 tablet (25 mg total) by mouth daily.     Allergies:   Ace inhibitors and Shellfish allergy   Social History   Socioeconomic History  . Marital status: Married    Spouse name: Not on file  . Number of children: Not on file  . Years of education: Not on file  . Highest education level: Not on file  Occupational History  . Not on file  Tobacco Use  . Smoking status: Never Smoker  . Smokeless tobacco: Never  Used  Substance and Sexual Activity  . Alcohol use: Never  . Drug use: Never  . Sexual activity: Not on file  Other Topics Concern  . Not on file  Social History Narrative  . Not on file   Social Determinants of Health   Financial Resource Strain: Not on file  Food Insecurity: Not on file  Transportation Needs: Not on file  Physical Activity: Not on file  Stress: Not on file  Social Connections: Not on file     Family History: The patient's family history includes Diabetes Mellitus I in his father; Hypertension in his father.  ROS:   Please see the history of present illness.     EKGs/Labs/Other Studies Reviewed:    EKG:  The ekg ordered today demonstrates NSR, rate 74, QRS 63ms, QTc .    Recent Labs: 12/20/2020: ALT 29 12/21/2020: Magnesium 2.3 12/23/2020: Hemoglobin 11.2; Platelets 228 12/25/2020: BUN 16; Creatinine, Ser 0.75; Potassium 3.6; Sodium 135  Recent Lipid Panel    Component Value Date/Time   CHOL 156 12/17/2020 0739   TRIG 94 12/17/2020 0739   HDL 34 (L) 12/17/2020 0739   CHOLHDL 4.6 12/17/2020 0739   VLDL 19 12/17/2020 0739   LDLCALC 103 (H) 12/17/2020 0739    Physical Exam:    VS:  BP (!) 142/88 (BP Location: Left Arm, Patient Position: Sitting, Cuff Size: Normal)   Pulse 74   Ht 6' (1.829 m)   Wt 282 lb 12.8 oz (128.3 kg)   BMI 38.35 kg/m     Wt Readings from Last 3 Encounters:  01/11/21 282 lb 12.8 oz (128.3 kg)  12/25/20 (!) 300 lb 12.8 oz (136.4 kg)     GEN:  Well nourished, well developed in no acute distress HEENT: Normal NECK: No JVD; No carotid bruits CARDIAC: RRR, no murmurs, rubs, gallops RESPIRATORY:  Clear to auscultation without rales, wheezing or rhonchi  ABDOMEN: Soft, non-tender, non-distended MUSCULOSKELETAL: Minimal ankle edema; No deformity  SKIN: Warm and dry.  Chest incision clean/dry, no erythema.  Ruddy appearance in shins to ankles with varicosities.   NEUROLOGIC:  Alert and oriented PSYCHIATRIC:  Normal affect    ASSESSMENT AND PLAN   Multivessel CAD s/p CABGx4  - CABG on 12/20/20 with Dr. Laneta Simmers (LIMA to LAD, SVG-Diag, SVG-Ramus, SVG-OM) - Cont ASA/Plavix x 1 year post NSTEMI - Cont high dose lipitor - Cont BB.  Pt c/o erectile dysfunction postop.  He was on atenolol prior to surgery.  I do not think his ED is 2/2 metoprolol.  His ED may be more psychological postop - f/u with PCP.    - Recommend cardiac rehab  HTN, BP poorly controlled.   - Increase losartan to 50mg  daily.  Check BP in 2 weeks.  If BP remains >130/80 in 2 weeks, then increase losartan  to 100mg  daily (This was his losartan dose prior to surgery).  - If BP remains elevated after losartan 100mg  daily, consider adding chlorthalidone.    Hyperlipidemia with goal LDL <70 - LDL 103 in 11/2020 - Continue Lipitor 80mg . - Check fasting lipids in 2 weeks.  Consider adding zetia if LDL remains >70.  Diabetes Mellitus, poorly controlled. - Long time DM since 19, Hgb A1c 8.2% in 09/2020, goal ~7% - F/U with PCP/endo  Bilateral carotid artery stenosis, asymptomatic - f/u with vascular (Dr. 12/2020) in 6 months with carotid .   Disposition: F/U with Dr. 10/2020 in 3 months.       Medication Adjustments/Labs and Tests Ordered: Current medicines are reviewed at length with the patient today.  Concerns regarding medicines are outlined above.  Orders Placed This Encounter  Procedures  . Lipid Profile  . EKG 12-Lead   Meds ordered this encounter  Medications  . losartan (COZAAR) 50 MG tablet    Sig: Take 1 tablet (50 mg total) by mouth daily.    Dispense:  30 tablet    Refill:  6    Patient Instructions  Medication Instructions:  INCREASE LOSARTAN 50MG  *If you need a refill on your cardiac medications before your next appointment, please call your pharmacy*  Lab Work: FASTING LIPID IN 2 WEEKS  ABOUT 01-25-21 If you have labs (blood work) drawn today and your tests are completely normal, you will receive your results only by:   MyChart Message (if you have MyChart) OR A paper copy in the mail.  If you have any lab test that is abnormal or we need to change your treatment, we will call you to review the results. You may go to any [facility] that is convenient for you however, we do have a lab in our office that is able to assist you. You DO NOT need an appointment for our lab. The lab is open 8:00am and closes at 4:00pm. Lunch 12:45 - 1:45pm.  Special Instructions OK TO START CARDIAC REHAB  TAKE AND LOG YOUR BLOOD PRESSURE OK TO INCREASE LOSARTAN 100MG  IF CONSISTENTLY >130/80  Follow-Up: Your next appointment:  3 month(s) In Person with You may see [place], MD or one of the following Advanced Practice Providers on your designated Care Team:  Eldridge Dace, PA-C  , PA-C  At [facility], you and your health needs are our priority.  As part of our continuing mission to provide you with exceptional heart care, we have created designated Provider Care Teams.  These Care Teams include your primary Cardiologist (physician) and Advanced Practice Providers (APPs -  Physician Assistants and Nurse Practitioners) who all work together to provide you with the care you need, when you need it.     Signed, , PA-C  01/11/2021 12:14 PM    [facility]

## 2021-01-11 ENCOUNTER — Encounter: Payer: Self-pay | Admitting: Physician Assistant

## 2021-01-11 ENCOUNTER — Other Ambulatory Visit: Payer: Self-pay

## 2021-01-11 ENCOUNTER — Ambulatory Visit (INDEPENDENT_AMBULATORY_CARE_PROVIDER_SITE_OTHER): Payer: 59 | Admitting: Physician Assistant

## 2021-01-11 VITALS — BP 142/88 | HR 74 | Ht 72.0 in | Wt 282.8 lb

## 2021-01-11 DIAGNOSIS — E1069 Type 1 diabetes mellitus with other specified complication: Secondary | ICD-10-CM

## 2021-01-11 DIAGNOSIS — Z951 Presence of aortocoronary bypass graft: Secondary | ICD-10-CM

## 2021-01-11 DIAGNOSIS — I2510 Atherosclerotic heart disease of native coronary artery without angina pectoris: Secondary | ICD-10-CM | POA: Diagnosis not present

## 2021-01-11 DIAGNOSIS — I10 Essential (primary) hypertension: Secondary | ICD-10-CM

## 2021-01-11 DIAGNOSIS — I6523 Occlusion and stenosis of bilateral carotid arteries: Secondary | ICD-10-CM

## 2021-01-11 DIAGNOSIS — Z79899 Other long term (current) drug therapy: Secondary | ICD-10-CM

## 2021-01-11 DIAGNOSIS — E785 Hyperlipidemia, unspecified: Secondary | ICD-10-CM

## 2021-01-11 MED ORDER — LOSARTAN POTASSIUM 50 MG PO TABS: 50.0000 mg | ORAL_TABLET | Freq: Every day | ORAL | 6 refills | Status: DC

## 2021-01-11 NOTE — Patient Instructions (Signed)
Medication Instructions:  INCREASE LOSARTAN 50MG  *If you need a refill on your cardiac medications before your next appointment, please call your pharmacy*  Lab Work: FASTING LIPID IN 2 WEEKS  ABOUT 01-25-21 If you have labs (blood work) drawn today and your tests are completely normal, you will receive your results only by:  MyChart Message (if you have MyChart) OR A paper copy in the mail.  If you have any lab test that is abnormal or we need to change your treatment, we will call you to review the results. You may go to any [facility] that is convenient for you however, we do have a lab in our office that is able to assist you. You DO NOT need an appointment for our lab. The lab is open 8:00am and closes at 4:00pm. Lunch 12:45 - 1:45pm.  Special Instructions OK TO START CARDIAC REHAB  TAKE AND LOG YOUR BLOOD PRESSURE OK TO INCREASE LOSARTAN 100MG  IF CONSISTENTLY >130/80  Follow-Up: Your next appointment:  3 month(s) In Person with You may see 11-13-1983, MD or one of the following Advanced Practice Providers on your designated Care Team:  , PA-C  Lance Muss, PA-C  At [facility], you and your health needs are our priority.  As part of our continuing mission to provide you with exceptional heart care, we have created designated Provider Care Teams.  These Care Teams include your primary Cardiologist (physician) and Advanced Practice Providers (APPs -  Physician Assistants and Nurse Practitioners) who all work together to provide you with the care you need, when you need it.

## 2021-01-12 ENCOUNTER — Encounter: Payer: 59 | Admitting: Occupational Therapy

## 2021-01-14 ENCOUNTER — Other Ambulatory Visit: Payer: Self-pay

## 2021-01-18 ENCOUNTER — Other Ambulatory Visit: Payer: Self-pay | Admitting: Surgery

## 2021-01-18 ENCOUNTER — Ambulatory Visit: Payer: 59 | Admitting: Occupational Therapy

## 2021-01-18 DIAGNOSIS — Z951 Presence of aortocoronary bypass graft: Secondary | ICD-10-CM

## 2021-01-19 ENCOUNTER — Other Ambulatory Visit: Payer: Self-pay

## 2021-01-19 ENCOUNTER — Ambulatory Visit (INDEPENDENT_AMBULATORY_CARE_PROVIDER_SITE_OTHER): Payer: Self-pay | Admitting: Surgery

## 2021-01-19 ENCOUNTER — Ambulatory Visit
Admission: RE | Admit: 2021-01-19 | Discharge: 2021-01-19 | Disposition: A | Discharge status: Home / Self Care | Payer: 59 | Source: Ambulatory Visit | Attending: Surgery | Admitting: Surgery

## 2021-01-19 ENCOUNTER — Encounter: Payer: Self-pay | Admitting: Surgery

## 2021-01-19 VITALS — BP 144/83 | HR 86 | Resp 20 | Ht 72.0 in | Wt 283.0 lb

## 2021-01-19 DIAGNOSIS — Z951 Presence of aortocoronary bypass graft: Secondary | ICD-10-CM

## 2021-01-19 NOTE — Progress Notes (Signed)
HPI: Patient returns for routine postoperative follow-up having undergone coronary bypass graft surgery x4 on 12/20/2020. The patient's early postoperative recovery while in the hospital was notable for an uncomplicated postoperative course. Since hospital discharge the patient reports that he has been feeling well.  He is walking daily without chest pain or shortness of breath.  He has been watching his diet closely and has lost some weight.  He has been monitoring his blood sugar closely.  He does report some persistent numbness in his left fourth and fifth fingers which has improved since the time of surgery.   Current Outpatient Medications  Medication Sig Dispense Refill  . acetaminophen (TYLENOL) 325 MG tablet Take 2 tablets (650 mg total) by mouth every 6 (six) hours as needed for mild pain.    Marland Kitchen aspirin 81 MG EC tablet Take 81 mg by mouth daily.    Marland Kitchen atorvastatin (LIPITOR) 80 MG tablet Take 1 tablet (80 mg total) by mouth daily. 30 tablet 3  . clopidogrel (PLAVIX) 75 MG tablet Take 1 tablet (75 mg total) by mouth daily. 30 tablet 3  . HUMALOG 100 UNIT/ML injection Inject into the skin 3 (three) times daily with meals. Sliding Scale    . losartan (COZAAR) 50 MG tablet Take 1 tablet (50 mg total) by mouth daily. 30 tablet 6  . metFORMIN (GLUCOPHAGE-XR) 500 MG 24 hr tablet Take 1,000 mg by mouth 2 (two) times daily.    . metoprolol tartrate (LOPRESSOR) 25 MG tablet Take 1 tablet (25 mg total) by mouth 2 (two) times daily. 60 tablet 3  . NOVOLOG 100 UNIT/ML injection Inject 10-30 Units into the skin 3 (three) times daily with meals. Sliding Scale    . TOUJEO MAX SOLOSTAR 300 UNIT/ML Solostar Pen Inject into the skin.    Marland Kitchen traMADol (ULTRAM) 50 MG tablet Take 1-2 tablets (50-100 mg total) by mouth every 4 (four) hours as needed for moderate pain. 30 tablet 0  . TRESIBA FLEXTOUCH 200 UNIT/ML FlexTouch Pen Inject 50 Units into the skin in the morning.     No current facility-administered  medications for this visit.    Physical Exam: BP (!) 144/83 (BP Location: Left Arm, Patient Position: Sitting)   Pulse 86   Resp 20   Ht 6' (1.829 m)   Wt 283 lb (128.4 kg)   SpO2 97% Comment: RA  BMI 38.38 kg/m  He looks well. Cardiac exam shows a regular rate and rhythm with normal heart sounds. Lungs are clear. Chest incision is healing well and the sternum is stable. His leg incision is healing well and there is no peripheral edema.   Diagnostic Tests:  Narrative & Impression  CLINICAL DATA:  Status post coronary artery bypass grafting  EXAM: CHEST - 2 VIEW  COMPARISON:  Dec 22, 2020  FINDINGS: There is a small left pleural effusion. No edema or airspace opacity. Heart size and pulmonary vascularity are normal. No adenopathy. Patient is status post median sternotomy. No evident pneumothorax. No adenopathy. No bone lesions.  IMPRESSION: Small left pleural effusion. Lungs elsewhere clear. Heart size within normal limits. No pneumothorax.   Electronically Signed   By: Bretta Bang III M.D.   On: 01/19/2021 09:31      Impression:  Overall I think he is making good progress following his surgery.  I encouraged him to continue ambulating as much as possible and to watch his diet closely.  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  We discussed the importance of maximum cardiac risk factor reduction.  I suspect to use left fourth and fifth finger numbness are related to brachial plexus stretch with sternal retraction in this gentleman with a large upper body and diabetes.  This has already improved since surgery and I would expect it to continue to improve over the next 6 to 12 months.  Plan:  He will continue to follow-up with cardiology and his PCP and will return to see me if he has any problems with his incisions.   Alleen Borne, MD [facility] [phone]

## 2021-01-21 ENCOUNTER — Ambulatory Visit: Payer: 59 | Admitting: Occupational Therapy

## 2021-01-25 ENCOUNTER — Ambulatory Visit: Payer: 59 | Admitting: Occupational Therapy

## 2021-01-27 ENCOUNTER — Ambulatory Visit: Payer: 59 | Admitting: Occupational Therapy

## 2021-01-31 ENCOUNTER — Encounter: Payer: 59 | Admitting: Occupational Therapy

## 2021-01-31 MED ORDER — AMLODIPINE BESYLATE 5 MG PO TABS: 5.0000 mg | ORAL_TABLET | Freq: Every day | ORAL | 3 refills | Status: DC

## 2021-02-02 ENCOUNTER — Encounter: Payer: 59 | Admitting: Occupational Therapy

## 2021-02-08 ENCOUNTER — Telehealth (HOSPITAL_COMMUNITY): Payer: Self-pay

## 2021-02-08 NOTE — Telephone Encounter (Signed)
Called patient to see if he was interested in participating in the Cardiac Rehab Program. Patient stated yes. Patient will come in for orientation on 03/03/2021@10 :00am and will attend the 3:15pm exercise class.   Pensions consultant.

## 2021-02-10 ENCOUNTER — Encounter: Payer: Self-pay | Admitting: *Deleted

## 2021-02-17 ENCOUNTER — Other Ambulatory Visit: Payer: Self-pay

## 2021-02-17 ENCOUNTER — Ambulatory Visit (INDEPENDENT_AMBULATORY_CARE_PROVIDER_SITE_OTHER): Payer: 59 | Admitting: Pharmacist

## 2021-02-17 VITALS — BP 148/60 | HR 79

## 2021-02-17 DIAGNOSIS — I10 Essential (primary) hypertension: Secondary | ICD-10-CM | POA: Diagnosis not present

## 2021-02-17 MED ORDER — AMLODIPINE BESYLATE 10 MG PO TABS: 10.0000 mg | ORAL_TABLET | Freq: Every day | ORAL | 3 refills | Status: DC

## 2021-02-17 NOTE — Patient Instructions (Signed)
It was nice to meet you. Please increase amlodipine to 10mg  daily Continue your exercise. Increase as able Continue working on portion control and good choices. Take it one choice at a time.  Call me at [phone] with any questions

## 2021-02-17 NOTE — Progress Notes (Signed)
Patient ID: Timothy Golden                 DOB: [birth year]                      MRN: [id number]     HPI: Timothy Golden is a 55 y.o. male referred by Dr. Eldridge Dace to HTN clinic. PMH is significant for  HTN, HLD, DM, obesity multivessel CAD s/p CABG and stroke. Patient underwent CABG on 12/20/20. His losartan dose was decreased to 25mg  daily post op. At follow up visit with Angie Duke his losartan was increased to 50mg  daily and then was increased to 100mg  daily. He sent a my chart message 6/12 that his BP was still high. Amlodipine 5mg  daily was added and he was referred to the HTN clinic.  Patient present to HTN clinic. He denies dizziness, lightheadedness, headache, blurred vision, SOB, or swelling. Sometimes he feels like his heart is pounding out of his chest when his blood pressure is higher. He has started walking 1.5 miles in the AM and PM. Is working on portion control. Goes back to work next week. Has been off for the last few months due to his heart attack and surgery. Blood pressure has been in the 150's systolic mainly at home. Did not see much difference between losartan 25mg  and 100mg . May have missed a few metoprolol evening doses.   Current HTN meds: amlodipine 5mg  daily, losartan 100mg  daily, metoprolol tartrate 25mg  BID Previously tried: atenolol, HCTZ BP goal: <130/80  Family History: The patient's family history includes Diabetes Mellitus I in his father; Hypertension in his father.  Social History: never smoked, drinks socially   Diet: eats out once a week, doesn't use salt on food Breakfast:1 toast with butter and PB or eggs Lunch: left overs (steak/chicken brocolli) chicken and cheese quesedilla, Dinner: frozen pizza, meat and vegetable Snack: banana, pear, nectarine, sometimes a cookie or peanuts/mix nuts Drink: diet coke, diet snapple, Gatorade zero, occasional water  Exercise: walks 1.5 miles in the AM and PM  Home BP readings: 150's a few reading in the 130's  Wt  Readings from Last 3 Encounters:  01/19/21 283 lb (128.4 kg)  01/11/21 282 lb 12.8 oz (128.3 kg)  12/25/20 (!) 300 lb 12.8 oz (136.4 kg)   BP Readings from Last 3 Encounters:  01/19/21 (!) 144/83  01/11/21 (!) 142/88  12/25/20 (!) 152/60   Pulse Readings from Last 3 Encounters:  01/19/21 86  01/11/21 74  12/25/20 96    Renal function: CrCl cannot be calculated (Patient's most recent lab result is older than the maximum 21 days allowed.).  Past Medical History:  Diagnosis Date   Diabetes mellitus without complication (HCC)    HLD (hyperlipidemia)    Hypertension    Obesity    Stroke [facility])     Current Outpatient Medications on File Prior to Visit  Medication Sig Dispense Refill   acetaminophen (TYLENOL) 325 MG tablet Take 2 tablets (650 mg total) by mouth every 6 (six) hours as needed for mild pain.     amLODipine (NORVASC) 5 MG tablet Take 1 tablet (5 mg total) by mouth daily. 90 tablet 3   aspirin 81 MG EC tablet Take 81 mg by mouth daily.     atorvastatin (LIPITOR) 80 MG tablet Take 1 tablet (80 mg total) by mouth daily. 30 tablet 3   clopidogrel (PLAVIX) 75 MG tablet Take 1 tablet (75 mg total) by mouth daily. 30 tablet  3   HUMALOG 100 UNIT/ML injection Inject into the skin 3 (three) times daily with meals. Sliding Scale     losartan (COZAAR) 50 MG tablet Take 1 tablet (50 mg total) by mouth daily. 30 tablet 6   metFORMIN (GLUCOPHAGE-XR) 500 MG 24 hr tablet Take 1,000 mg by mouth 2 (two) times daily.     metoprolol tartrate (LOPRESSOR) 25 MG tablet Take 1 tablet (25 mg total) by mouth 2 (two) times daily. 60 tablet 3   NOVOLOG 100 UNIT/ML injection Inject 10-30 Units into the skin 3 (three) times daily with meals. Sliding Scale     TOUJEO MAX SOLOSTAR 300 UNIT/ML Solostar Pen Inject into the skin.     traMADol (ULTRAM) 50 MG tablet Take 1-2 tablets (50-100 mg total) by mouth every 4 (four) hours as needed for moderate pain. 30 tablet 0   TRESIBA FLEXTOUCH 200 UNIT/ML  FlexTouch Pen Inject 50 Units into the skin in the morning.     No current facility-administered medications on file prior to visit.    Allergies  Allergen Reactions   Ace Inhibitors     Other reaction(s): Cough (ALLERGY/intolerance)   Shellfish Allergy Rash    hives    There were no vitals taken for this visit.   Assessment/Plan:  1. Hypertension - Blood pressure is above goal of <130/80 in clinic. We discussed medication options including changing out losartan for a stronger ARB, increasing amlodipine or adding back a thiazide. Patient has a large supply of losartan at home. His latest labs in May showed a K of 3.6, Na 135 and a calcium of 8.3. Although thiazide would positively effect his low Ca, could make his lower K and Na worse. Will recheck labs today to see his new baseline. Increase amlodipine to 10mg  daily. Continue losartan 100mg  and metoprolol tartrate 25mg  BID. Follow up in 2-3 weeks.   We spent a good portion of vist discussing diet and areas or improvement including drinking more water, less artifical sweeteners, keeping sweets out of the house.    Thank you  , Pharm.D, BCPS, CPP [facility]  [address] [place], [place] [address]  Phone: [phone]; Fax: [phone]

## 2021-02-18 LAB — BASIC METABOLIC PANEL
BUN/Creatinine Ratio: 29 — ABNORMAL HIGH (ref 9–20)
BUN: 20 mg/dL (ref 6–24)
CO2: 21 mmol/L (ref 20–29)
Calcium: 10.1 mg/dL (ref 8.7–10.2)
Chloride: 106 mmol/L (ref 96–106)
Creatinine, Ser: 0.7 mg/dL — ABNORMAL LOW (ref 0.76–1.27)
Glucose: 130 mg/dL — ABNORMAL HIGH (ref 65–99)
Potassium: 4.3 mmol/L (ref 3.5–5.2)
Sodium: 143 mmol/L (ref 134–144)
eGFR: 109 mL/min/{1.73_m2} (ref >59)

## 2021-03-02 ENCOUNTER — Telehealth (HOSPITAL_COMMUNITY): Payer: Self-pay | Admitting: Otolaryngology

## 2021-03-02 ENCOUNTER — Telehealth (HOSPITAL_COMMUNITY): Payer: Self-pay | Admitting: *Deleted

## 2021-03-03 ENCOUNTER — Inpatient Hospital Stay (HOSPITAL_COMMUNITY): Admission: RE | Admit: 2021-03-03 | Payer: 59 | Source: Ambulatory Visit

## 2021-03-07 ENCOUNTER — Ambulatory Visit (HOSPITAL_COMMUNITY): Payer: 59

## 2021-03-08 ENCOUNTER — Other Ambulatory Visit: Payer: Self-pay

## 2021-03-08 ENCOUNTER — Ambulatory Visit (INDEPENDENT_AMBULATORY_CARE_PROVIDER_SITE_OTHER): Payer: 59 | Admitting: Pharmacist

## 2021-03-08 VITALS — BP 140/62 | HR 82

## 2021-03-08 DIAGNOSIS — I10 Essential (primary) hypertension: Secondary | ICD-10-CM | POA: Diagnosis not present

## 2021-03-08 MED ORDER — CHLORTHALIDONE 25 MG PO TABS: 12.5000 mg | ORAL_TABLET | Freq: Every day | ORAL | 3 refills | Status: DC

## 2021-03-08 NOTE — Progress Notes (Signed)
Patient ID: Timothy Golden                 DOB: 08-Jun-1966                      MRN: [id number]     HPI: Timothy Golden is a 55 y.o. male referred by Dr. Eldridge Dace to HTN clinic. PMH is significant for  HTN, HLD, DM, obesity multivessel CAD s/p CABG and stroke. Patient underwent CABG on 12/20/20. His losartan dose was decreased to 25mg  daily post op. At follow up visit with Angie Duke his losartan was increased to 50mg  daily and then was increased to 100mg  daily. He sent a my chart message 6/12 that his BP was still high. Amlodipine 5mg  daily was added and he was referred to the HTN clinic.  At initial HTN clinic apt, amlodipine was increased to 10mg  daily. Good discussion on diet improvements was also had.   Patient presents today for follow up. Blood pressure improved, but still high around 140/80. He has been drinking more water, less diet drinks. Has a mild amount of swelling in his legs. Resumed work a few weeks ago. Is now on his feet more. Has some stiff joints in the AM. Denies dizziness, lightheadedness or SOB.  Current HTN meds: amlodipine 10mg  daily, losartan 100mg  daily, metoprolol tartrate 25mg  BID Previously tried: atenolol, HCTZ BP goal: <130/80  Family History: The patient's family history includes Diabetes Mellitus I in his father; Hypertension in his father.  Social History: never smoked, drinks socially   Diet: eats out once a week, doesn't use salt on food Breakfast:1 toast with butter and PB or eggs Lunch: left overs (steak/chicken brocolli) chicken and cheese quesedilla, Dinner: frozen pizza, meat and vegetable Snack: banana, pear, nectarine, sometimes a cookie or peanuts/mix nuts Drink: diet coke, diet snapple, Gatorade zero, occasional water  Exercise: walks 1.5 miles in the AM and PM  Home BP readings: 140/80  Wt Readings from Last 3 Encounters:  01/19/21 283 lb (128.4 kg)  01/11/21 282 lb 12.8 oz (128.3 kg)  12/25/20 (!) 300 lb 12.8 oz (136.4 kg)   BP Readings  from Last 3 Encounters:  02/17/21 (!) 148/60  01/19/21 (!) 144/83  01/11/21 (!) 142/88   Pulse Readings from Last 3 Encounters:  02/17/21 79  01/19/21 86  01/11/21 74    Renal function: CrCl cannot be calculated (Unknown ideal weight.).  Past Medical History:  Diagnosis Date   Diabetes mellitus without complication (HCC)    HLD (hyperlipidemia)    Hypertension    Obesity    Stroke [facility])     Current Outpatient Medications on File Prior to Visit  Medication Sig Dispense Refill   acetaminophen (TYLENOL) 325 MG tablet Take 2 tablets (650 mg total) by mouth every 6 (six) hours as needed for mild pain.     amLODipine (NORVASC) 10 MG tablet Take 1 tablet (10 mg total) by mouth daily. 90 tablet 3   aspirin 81 MG EC tablet Take 81 mg by mouth daily.     atorvastatin (LIPITOR) 80 MG tablet Take 1 tablet (80 mg total) by mouth daily. 30 tablet 3   clopidogrel (PLAVIX) 75 MG tablet Take 1 tablet (75 mg total) by mouth daily. 30 tablet 3   HUMALOG 100 UNIT/ML injection Inject into the skin 3 (three) times daily with meals. Sliding Scale     losartan (COZAAR) 100 MG tablet Take 100 mg by mouth daily.     metFORMIN (  GLUCOPHAGE-XR) 500 MG 24 hr tablet Take 500 mg by mouth 2 (two) times daily.     metoprolol tartrate (LOPRESSOR) 25 MG tablet Take 1 tablet (25 mg total) by mouth 2 (two) times daily. 60 tablet 3   NOVOLOG 100 UNIT/ML injection Inject 10-30 Units into the skin 3 (three) times daily with meals. Sliding Scale     TOUJEO MAX SOLOSTAR 300 UNIT/ML Solostar Pen Inject 35 Units into the skin daily.     No current facility-administered medications on file prior to visit.    Allergies  Allergen Reactions   Ace Inhibitors     Other reaction(s): Cough (ALLERGY/intolerance)   Shellfish Allergy Rash    hives    There were no vitals taken for this visit.   Assessment/Plan:  1. Hypertension - Blood pressure is above goal of <130/80 in clinic. Will add chlorthalidone 12.5mg  daily.  Continue amlodipine 10mg  daily, losartan 100mg  daily, metoprolol tartrate 25mg  twice a day. Follow up in clinic in 2 weeks. Will check BMP at that appointment. Advised patient that his swelling could be from increased amlodipine dose or the fact that he has been on his feet a lot more. If swelling gets worse or chlorthalidone doesn't help, can consider decreasing amlodipine back to 5mg . Follow up in clinic in 2 weeks.   Thank you  , Pharm.D, BCPS, CPP [facility]  [address] [place], [place]  Phone: [phone]; Fax: [phone]

## 2021-03-08 NOTE — Patient Instructions (Addendum)
Start taking chlorthalidone 12.5mg  daily  Continue amlodipine 10mg  daily, losartan 100mg  daily, metoprolol tartrate 25mg  twice a day  Continue checking blood pressure at home  Call me with any issues at [phone]  Continue walking daily

## 2021-03-09 ENCOUNTER — Ambulatory Visit (HOSPITAL_COMMUNITY): Payer: 59

## 2021-03-11 ENCOUNTER — Ambulatory Visit (HOSPITAL_COMMUNITY): Payer: 59

## 2021-03-14 ENCOUNTER — Ambulatory Visit (HOSPITAL_COMMUNITY): Payer: 59

## 2021-03-16 ENCOUNTER — Ambulatory Visit (HOSPITAL_COMMUNITY): Payer: 59

## 2021-03-18 ENCOUNTER — Ambulatory Visit (HOSPITAL_COMMUNITY): Payer: 59

## 2021-03-21 ENCOUNTER — Ambulatory Visit (HOSPITAL_COMMUNITY): Payer: 59

## 2021-03-22 ENCOUNTER — Ambulatory Visit (INDEPENDENT_AMBULATORY_CARE_PROVIDER_SITE_OTHER): Payer: 59 | Admitting: Pharmacist

## 2021-03-22 ENCOUNTER — Other Ambulatory Visit: Payer: Self-pay

## 2021-03-22 ENCOUNTER — Other Ambulatory Visit: Payer: Self-pay | Admitting: Physician Assistant

## 2021-03-22 VITALS — BP 138/62 | HR 82

## 2021-03-22 DIAGNOSIS — I10 Essential (primary) hypertension: Secondary | ICD-10-CM | POA: Diagnosis not present

## 2021-03-22 NOTE — Progress Notes (Signed)
Patient ID: Timothy Golden                 DOB: [birth year]                      MRN: [id number]     HPI: Timothy Golden is a 55 y.o. male referred by Dr. Eldridge Dace to HTN clinic. PMH is significant for  HTN, HLD, DM, obesity multivessel CAD s/p CABG and stroke. Patient underwent CABG on 12/20/20. His losartan dose was decreased to 25mg  daily post op. At follow up visit with Angie Duke his losartan was increased to 50mg  daily and then was increased to 100mg  daily. He sent a my chart message 6/12 that his BP was still high. Amlodipine 5mg  daily was added and he was referred to the HTN clinic.  At initial HTN clinic apt, amlodipine was increased to 10mg  daily. Good discussion on diet improvements was also had. At last vist, chlorthaldione 12.5mg  daily was started.  Patient presents today for follow up. Blood pressure running in the mid 140's /mid 60's per patient. Swelling is much better. Denies dizziness, lighheadedness, headaches or blurred vision.   Current HTN meds: amlodipine 10mg  daily, losartan 100mg  daily, metoprolol tartrate 25mg  BID, chlorthalidone 12.5mg  daily Previously tried: atenolol, HCTZ BP goal: <130/80  Family History: The patient's family history includes Diabetes Mellitus I in his father; Hypertension in his father.  Social History: never smoked, drinks socially   Diet: eats out once a week, doesn't use salt on food Breakfast:1 toast with butter and PB or eggs Lunch: left overs (steak/chicken brocolli) chicken and cheese quesedilla, Dinner: frozen pizza, meat and vegetable Snack: banana, pear, nectarine, sometimes a cookie or peanuts/mix nuts Drink: diet coke, diet snapple, Gatorade zero, occasional water  Exercise: walks 1.5 miles in the AM and PM  Home BP readings: mid 140's/ mid 60's  Wt Readings from Last 3 Encounters:  01/19/21 283 lb (128.4 kg)  01/11/21 282 lb 12.8 oz (128.3 kg)  12/25/20 (!) 300 lb 12.8 oz (136.4 kg)   BP Readings from Last 3 Encounters:  03/08/21  140/62  02/17/21 (!) 148/60  01/19/21 (!) 144/83   Pulse Readings from Last 3 Encounters:  03/08/21 82  02/17/21 79  01/19/21 86    Renal function: CrCl cannot be calculated (Patient's most recent lab result is older than the maximum 21 days allowed.).  Past Medical History:  Diagnosis Date   Diabetes mellitus without complication (HCC)    HLD (hyperlipidemia)    Hypertension    Obesity    Stroke [facility])     Current Outpatient Medications on File Prior to Visit  Medication Sig Dispense Refill   acetaminophen (TYLENOL) 325 MG tablet Take 2 tablets (650 mg total) by mouth every 6 (six) hours as needed for mild pain.     amLODipine (NORVASC) 10 MG tablet Take 1 tablet (10 mg total) by mouth daily. 90 tablet 3   aspirin 81 MG EC tablet Take 81 mg by mouth daily.     atorvastatin (LIPITOR) 80 MG tablet Take 1 tablet (80 mg total) by mouth daily. 30 tablet 3   chlorthalidone (HYGROTON) 25 MG tablet Take 0.5 tablets (12.5 mg total) by mouth daily. 45 tablet 3   clopidogrel (PLAVIX) 75 MG tablet Take 1 tablet (75 mg total) by mouth daily. 30 tablet 3   HUMALOG 100 UNIT/ML injection Inject into the skin 3 (three) times daily with meals. Sliding Scale     losartan (COZAAR)  100 MG tablet Take 100 mg by mouth daily.     metFORMIN (GLUCOPHAGE-XR) 500 MG 24 hr tablet Take 500 mg by mouth 2 (two) times daily.     metoprolol tartrate (LOPRESSOR) 25 MG tablet Take 1 tablet (25 mg total) by mouth 2 (two) times daily. 60 tablet 3   NOVOLOG 100 UNIT/ML injection Inject 10-30 Units into the skin 3 (three) times daily with meals. Sliding Scale     TOUJEO MAX SOLOSTAR 300 UNIT/ML Solostar Pen Inject 20 Units into the skin daily.     No current facility-administered medications on file prior to visit.    Allergies  Allergen Reactions   Ace Inhibitors     Other reaction(s): Cough (ALLERGY/intolerance)   Shellfish Allergy Rash    hives    There were no vitals taken for this  visit.   Assessment/Plan:  1. Hypertension - Blood pressure is above goal of <130/80 in clinic. Will check BMP today. If stable, will increase chlorthalidone to 25mg  daily. I will call patient tomorrow to confirm plan. Continue amlodipine 10mg  daily, losartan 100mg  daily, metoprolol tartrate 25mg  twice a day. He will f/u with Dr. on 8/25. I will see him back in clinic after that if needed.    Thank you  , Pharm.D, BCPS, CPP [facility]  [address] [place], [address] [place]  Phone: [phone]; Fax: [phone]

## 2021-03-22 NOTE — Patient Instructions (Addendum)
Continue amlodipine 10mg  daily, losartan 100mg  daily, metoprolol tartrate 25mg  twice a day  We will plan to increase chlorthalidone to 25mg  daily if labs look good. I will call you tomorrow to confirm

## 2021-03-23 ENCOUNTER — Telehealth: Payer: Self-pay | Admitting: Pharmacist

## 2021-03-23 ENCOUNTER — Ambulatory Visit (HOSPITAL_COMMUNITY): Payer: 59

## 2021-03-23 LAB — BASIC METABOLIC PANEL
BUN/Creatinine Ratio: 26 — ABNORMAL HIGH (ref 9–20)
BUN: 23 mg/dL (ref 6–24)
CO2: 26 mmol/L (ref 20–29)
Calcium: 9.4 mg/dL (ref 8.7–10.2)
Chloride: 106 mmol/L (ref 96–106)
Creatinine, Ser: 0.89 mg/dL (ref 0.76–1.27)
Glucose: 91 mg/dL (ref 65–99)
Potassium: 4.2 mmol/L (ref 3.5–5.2)
Sodium: 142 mmol/L (ref 134–144)
eGFR: 101 mL/min/{1.73_m2} (ref >59)

## 2021-03-23 MED ORDER — CHLORTHALIDONE 25 MG PO TABS: 25.0000 mg | ORAL_TABLET | Freq: Every day | ORAL | 3 refills | Status: DC

## 2021-03-23 NOTE — Telephone Encounter (Signed)
BMP stable. Will increase chlorthalidone to 25mg  daily as planned. Attempted to call patient. Mailbox full. Will try again later

## 2021-03-23 NOTE — Telephone Encounter (Signed)
Patient made aware of results. In agreement to increase chlorthalidone to 25mg  daily. Follow up in 3 weeks in clinic.

## 2021-03-25 ENCOUNTER — Ambulatory Visit (HOSPITAL_COMMUNITY): Payer: 59

## 2021-03-28 ENCOUNTER — Ambulatory Visit (HOSPITAL_COMMUNITY): Payer: 59

## 2021-03-30 ENCOUNTER — Ambulatory Visit (HOSPITAL_COMMUNITY): Payer: 59

## 2021-04-01 ENCOUNTER — Ambulatory Visit (HOSPITAL_COMMUNITY): Payer: 59

## 2021-04-04 ENCOUNTER — Ambulatory Visit (HOSPITAL_COMMUNITY): Payer: 59

## 2021-04-06 ENCOUNTER — Ambulatory Visit (HOSPITAL_COMMUNITY): Payer: 59

## 2021-04-08 ENCOUNTER — Ambulatory Visit (HOSPITAL_COMMUNITY): Payer: 59

## 2021-04-11 ENCOUNTER — Ambulatory Visit (HOSPITAL_COMMUNITY): Payer: 59

## 2021-04-13 ENCOUNTER — Ambulatory Visit (HOSPITAL_COMMUNITY): Payer: 59

## 2021-04-14 ENCOUNTER — Ambulatory Visit (INDEPENDENT_AMBULATORY_CARE_PROVIDER_SITE_OTHER): Payer: 59 | Admitting: Interventional Cardiology

## 2021-04-14 ENCOUNTER — Other Ambulatory Visit: Payer: Self-pay

## 2021-04-14 ENCOUNTER — Encounter: Payer: Self-pay | Admitting: Interventional Cardiology

## 2021-04-14 VITALS — BP 150/80 | HR 83 | Ht 72.0 in | Wt 284.8 lb

## 2021-04-14 DIAGNOSIS — I10 Essential (primary) hypertension: Secondary | ICD-10-CM

## 2021-04-14 DIAGNOSIS — I2510 Atherosclerotic heart disease of native coronary artery without angina pectoris: Secondary | ICD-10-CM | POA: Diagnosis not present

## 2021-04-14 DIAGNOSIS — E785 Hyperlipidemia, unspecified: Secondary | ICD-10-CM

## 2021-04-14 DIAGNOSIS — E1069 Type 1 diabetes mellitus with other specified complication: Secondary | ICD-10-CM

## 2021-04-14 DIAGNOSIS — I6523 Occlusion and stenosis of bilateral carotid arteries: Secondary | ICD-10-CM

## 2021-04-14 MED ORDER — CARVEDILOL 6.25 MG PO TABS: 6.2500 mg | ORAL_TABLET | Freq: Two times a day (BID) | ORAL | 3 refills | Status: DC

## 2021-04-14 NOTE — Patient Instructions (Signed)
Medication Instructions:  Your physician has recommended you make the following change in your medication:  STOP: metoprolol START:  carvedilol (Coreg) 6.25 mg by mouth twice daily  *If you need a refill on your cardiac medications before your next appointment, please call your pharmacy*   Lab Work: NONE If you have labs (blood work) drawn today and your tests are completely normal, you will receive your results only by: MyChart Message (if you have MyChart) OR A paper copy in the mail If you have any lab test that is abnormal or we need to change your treatment, we will call you to review the results.   Testing/Procedures: NONE   Follow-Up: At [facility], you and your health needs are our priority.  As part of our continuing mission to provide you with exceptional heart care, we have created designated Provider Care Teams.  These Care Teams include your primary Cardiologist (physician) and Advanced Practice Providers (APPs -  Physician Assistants and Nurse Practitioners) who all work together to provide you with the care you need, when you need it.  We recommend signing up for the patient portal called "MyChart".  Sign up information is provided on this After Visit Summary.  MyChart is used to connect with patients for Virtual Visits (Telemedicine).  Patients are able to view lab/test results, encounter notes, upcoming appointments, etc.  Non-urgent messages can be sent to your provider as well.   To learn more about what you can do with MyChart, go to [email or web address].    Your next appointment:   6 month(s)  The format for your next appointment:   In Person  Provider:   You may see Lance Muss, MD or one of the following Advanced Practice Providers on your designated Care Team:   Ronie Spies, PA-C Jacolyn Reedy, PA-C   Other Instructions

## 2021-04-14 NOTE — Progress Notes (Signed)
Cardiology Office Note   Date:  04/14/2021   ID:  Timothy Golden, DOB 1965-12-01, MRN [id number]  PCP:  Raynelle Bring [facility]    No chief complaint on file.  CAD  Wt Readings from Last 3 Encounters:  04/14/21 284 lb 12.8 oz (129.2 kg)  01/19/21 283 lb (128.4 kg)  01/11/21 282 lb 12.8 oz (128.3 kg)       History of Present Illness: Timothy Golden is a 55 y.o. male  with a hx of HTN, HLD, DM and obesity who presented to the ED 12/16/2020 with chest pain.  Approximately 2 hours prior to arrival in the emergency department he had an episode of heaviness in his chest after getting up.  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  Pt underwent CABG x4 (LIMA to LAD, SVG-Diag, SVG-Ramus, SVG-OM).  Preop studies showed EF 60-65% and bilateral ICA stenosis (60-70%).  Dr. Myra Gianotti (vascular) recommended f/u carotid duplex 6 months post CABG.  Denies : Chest pain. Dizziness. Leg edema. Nitroglycerin use. Orthopnea. Palpitations. Paroxysmal nocturnal dyspnea. Shortness of breath. Syncope.   Exercising more.  Trying to eat healthy.      Past Medical History:  Diagnosis Date   Diabetes mellitus without complication (HCC)    HLD (hyperlipidemia)    Hypertension    Obesity    Stroke [facility])     Past Surgical History:  Procedure Laterality Date   CORONARY ARTERY BYPASS GRAFT N/A 12/20/2020   Procedure: CORONARY ARTERY BYPASS GRAFTING (CABG) x FOUR, USING LEFT INTERNAL MAMMARY ARTERY AND RIGHT LEG GREATER SAPHENOUS VEIN HARVESTED ENDOSCOPICALLY;  Surgeon: Alleen Borne, MD;  Location: [facility] OR;  Service: Open Heart Surgery;  Laterality: N/A;   EYE SURGERY     LEFT HEART CATH AND CORONARY ANGIOGRAPHY N/A 12/16/2020   Procedure: LEFT HEART CATH AND CORONARY ANGIOGRAPHY;  Surgeon: Corky Crafts, MD;  Location: [facility] INVASIVE CV LAB;  Service: Cardiovascular;  Laterality: N/A;   SHOULDER SURGERY     TEE WITHOUT CARDIOVERSION N/A 12/20/2020   Procedure: TRANSESOPHAGEAL  ECHOCARDIOGRAM (TEE);  Surgeon: Alleen Borne, MD;  Location: [facility] OR;  Service: Open Heart Surgery;  Laterality: N/A;     Current Outpatient Medications  Medication Sig Dispense Refill   acetaminophen (TYLENOL) 325 MG tablet Take 2 tablets (650 mg total) by mouth every 6 (six) hours as needed for mild pain.     amLODipine (NORVASC) 10 MG tablet Take 1 tablet (10 mg total) by mouth daily. 90 tablet 3   aspirin 81 MG EC tablet Take 81 mg by mouth daily.     atorvastatin (LIPITOR) 80 MG tablet Take 1 tablet (80 mg total) by mouth daily. 30 tablet 3   carvedilol (COREG) 6.25 MG tablet Take 1 tablet (6.25 mg total) by mouth 2 (two) times daily. 180 tablet 3   chlorthalidone (HYGROTON) 25 MG tablet Take 1 tablet (25 mg total) by mouth daily. 90 tablet 3   clopidogrel (PLAVIX) 75 MG tablet Take 1 tablet (75 mg total) by mouth daily. 30 tablet 3   HUMALOG 100 UNIT/ML injection Inject into the skin 3 (three) times daily with meals. Sliding Scale     losartan (COZAAR) 100 MG tablet Take 100 mg by mouth daily.     metFORMIN (GLUCOPHAGE-XR) 500 MG 24 hr tablet Take 500 mg by mouth 2 (two) times daily.     NOVOLOG 100 UNIT/ML injection Inject 10-30 Units into the skin 3 (three) times daily with meals. Sliding Scale  TOUJEO MAX SOLOSTAR 300 UNIT/ML Solostar Pen Inject 20 Units into the skin daily.     No current facility-administered medications for this visit.    Allergies:   Ace inhibitors and Shellfish allergy    Social History:  The patient  reports that he has never smoked. He has never used smokeless tobacco. He reports that he does not drink alcohol and does not use drugs.   Family History:  The patient's family history includes Diabetes Mellitus I in his father; Hypertension in his father.    ROS:  Please see the history of present illness.   Otherwise, review of systems are positive for intentional weight loss.   All other systems are reviewed and negative.    PHYSICAL EXAM: VS:  BP  (!) 150/80   Pulse 83   Ht 6' (1.829 m)   Wt 284 lb 12.8 oz (129.2 kg)   SpO2 96%   BMI 38.63 kg/m  , BMI Body mass index is 38.63 kg/m. GEN: Well nourished, well developed, in no acute distress HEENT: normal Neck: no JVD, carotid bruits, or masses Cardiac: RRR; no murmurs, rubs, or gallops,no edema  Respiratory:  clear to auscultation bilaterally, normal work of breathing GI: soft, nontender, nondistended, obese MS: no deformity or atrophy Skin: warm and dry, no rash Neuro:  Strength and sensation are intact Psych: euthymic mood, full affect    Recent Labs: 12/20/2020: ALT 29 12/21/2020: Magnesium 2.3 12/23/2020: Hemoglobin 11.2; Platelets 228 03/22/2021: BUN 23; Creatinine, Ser 0.89; Potassium 4.2; Sodium 142   Lipid Panel    Component Value Date/Time   CHOL 156 12/17/2020 0739   TRIG 94 12/17/2020 0739   HDL 34 (L) 12/17/2020 0739   CHOLHDL 4.6 12/17/2020 0739   VLDL 19 12/17/2020 0739   LDLCALC 103 (H) 12/17/2020 0739     Other studies Reviewed: Additional studies/ records that were reviewed today with results demonstrating: labs reviewed. EF 60-65% by echo.   ASSESSMENT AND PLAN:  CAD:  No angina.  Continue aggressive secondary prevention.   HTN: Stop metoprolol.  Start Coreg 6.25 mg BID.  Continue attempts at weight loss.  Hyperlipidemia: COntinue high dose atorvastatin. LDL 103.  DM1: Long time DM since 55 y/o.  Lost weight since CABG.  Working on getting A1C down.  8.2 in 11/2020. Carotid artery disease: f/u with Dr. Myra Gianotti.    Current medicines are reviewed at length with the patient today.  The patient concerns regarding his medicines were addressed.  The following changes have been made:  No change  Labs/ tests ordered today include:  No orders of the defined types were placed in this encounter.   Recommend 150 minutes/week of aerobic exercise Low fat, low carb, high fiber diet recommended  Disposition:   FU in 6 months   Signed, Lance Muss,  MD  04/14/2021 4:54 PM    [facility] [address] [place], [place], [place]  [address] Phone: [phone]; Fax: [phone]

## 2021-04-15 ENCOUNTER — Ambulatory Visit (HOSPITAL_COMMUNITY): Payer: 59

## 2021-04-15 ENCOUNTER — Telehealth: Payer: Self-pay | Admitting: Pharmacist

## 2021-04-15 NOTE — Telephone Encounter (Signed)
Attempted to call pt to schedule follow up in HTN clinic in 2-3 week. Mailbox full. Will try again later

## 2021-04-18 ENCOUNTER — Ambulatory Visit (HOSPITAL_COMMUNITY): Payer: 59

## 2021-04-19 ENCOUNTER — Other Ambulatory Visit: Payer: Self-pay | Admitting: Physician Assistant

## 2021-04-20 ENCOUNTER — Ambulatory Visit (HOSPITAL_COMMUNITY): Payer: 59

## 2021-04-22 ENCOUNTER — Ambulatory Visit (HOSPITAL_COMMUNITY): Payer: 59

## 2021-04-22 MED ORDER — CLOPIDOGREL BISULFATE 75 MG PO TABS: 75.0000 mg | ORAL_TABLET | Freq: Every day | ORAL | 3 refills | Status: DC

## 2021-04-27 ENCOUNTER — Ambulatory Visit (HOSPITAL_COMMUNITY): Payer: 59

## 2021-04-29 ENCOUNTER — Ambulatory Visit (HOSPITAL_COMMUNITY): Payer: 59

## 2021-05-03 ENCOUNTER — Telehealth (HOSPITAL_COMMUNITY): Payer: Self-pay

## 2021-05-05 ENCOUNTER — Inpatient Hospital Stay (HOSPITAL_COMMUNITY): Admission: RE | Admit: 2021-05-05 | Payer: 59 | Source: Ambulatory Visit

## 2021-05-09 ENCOUNTER — Ambulatory Visit (HOSPITAL_COMMUNITY): Payer: 59

## 2021-05-11 ENCOUNTER — Ambulatory Visit (HOSPITAL_COMMUNITY): Payer: 59

## 2021-05-13 ENCOUNTER — Ambulatory Visit (HOSPITAL_COMMUNITY): Payer: 59

## 2021-05-16 ENCOUNTER — Ambulatory Visit (HOSPITAL_COMMUNITY): Payer: 59

## 2021-05-18 ENCOUNTER — Ambulatory Visit (HOSPITAL_COMMUNITY): Payer: 59

## 2021-05-20 ENCOUNTER — Ambulatory Visit (HOSPITAL_COMMUNITY): Payer: 59

## 2021-05-23 ENCOUNTER — Ambulatory Visit (HOSPITAL_COMMUNITY): Payer: 59

## 2021-05-25 ENCOUNTER — Ambulatory Visit (HOSPITAL_COMMUNITY): Payer: 59

## 2021-05-27 ENCOUNTER — Ambulatory Visit (HOSPITAL_COMMUNITY): Payer: 59

## 2021-05-30 ENCOUNTER — Ambulatory Visit (HOSPITAL_COMMUNITY): Payer: 59

## 2021-06-01 ENCOUNTER — Ambulatory Visit (HOSPITAL_COMMUNITY): Payer: 59

## 2021-06-03 ENCOUNTER — Ambulatory Visit (HOSPITAL_COMMUNITY): Payer: 59

## 2021-06-06 ENCOUNTER — Ambulatory Visit (HOSPITAL_COMMUNITY): Payer: 59

## 2021-06-08 ENCOUNTER — Ambulatory Visit (HOSPITAL_COMMUNITY): Payer: 59

## 2021-06-10 ENCOUNTER — Ambulatory Visit (HOSPITAL_COMMUNITY): Payer: 59

## 2021-06-10 MED ORDER — LOSARTAN POTASSIUM 100 MG PO TABS: 100.0000 mg | ORAL_TABLET | Freq: Every day | ORAL | 3 refills | Status: DC

## 2021-06-10 MED ORDER — ATORVASTATIN CALCIUM 80 MG PO TABS: 80.0000 mg | ORAL_TABLET | Freq: Every day | ORAL | 3 refills | Status: DC

## 2021-06-13 ENCOUNTER — Ambulatory Visit (HOSPITAL_COMMUNITY): Payer: 59

## 2021-06-15 ENCOUNTER — Ambulatory Visit (HOSPITAL_COMMUNITY): Payer: 59

## 2021-06-17 ENCOUNTER — Ambulatory Visit (HOSPITAL_COMMUNITY): Payer: 59

## 2021-06-20 ENCOUNTER — Ambulatory Visit (HOSPITAL_COMMUNITY): Payer: 59

## 2021-06-22 ENCOUNTER — Ambulatory Visit (HOSPITAL_COMMUNITY): Payer: 59

## 2021-06-24 ENCOUNTER — Ambulatory Visit (HOSPITAL_COMMUNITY): Payer: 59

## 2021-06-27 ENCOUNTER — Ambulatory Visit (HOSPITAL_COMMUNITY): Payer: 59

## 2021-06-29 ENCOUNTER — Ambulatory Visit (HOSPITAL_COMMUNITY): Payer: 59

## 2021-07-01 ENCOUNTER — Ambulatory Visit (HOSPITAL_COMMUNITY): Payer: 59

## 2021-07-04 MED ORDER — IRBESARTAN 300 MG PO TABS: 300.0000 mg | ORAL_TABLET | Freq: Every day | ORAL | 3 refills | Status: DC

## 2021-07-07 ENCOUNTER — Other Ambulatory Visit: Payer: Self-pay | Admitting: Physician Assistant

## 2021-07-14 ENCOUNTER — Encounter: Payer: Self-pay | Admitting: Pharmacist

## 2021-07-18 MED ORDER — CLOPIDOGREL BISULFATE 75 MG PO TABS: 75.0000 mg | ORAL_TABLET | Freq: Every day | ORAL | 3 refills | Status: DC

## 2021-07-18 MED ORDER — AMLODIPINE BESYLATE 10 MG PO TABS: 10.0000 mg | ORAL_TABLET | Freq: Every day | ORAL | 3 refills | Status: DC

## 2021-07-19 ENCOUNTER — Other Ambulatory Visit: Payer: Self-pay

## 2021-07-19 ENCOUNTER — Ambulatory Visit (INDEPENDENT_AMBULATORY_CARE_PROVIDER_SITE_OTHER): Payer: Self-pay | Admitting: Pharmacist

## 2021-07-19 VITALS — BP 136/76 | HR 70

## 2021-07-19 DIAGNOSIS — I10 Essential (primary) hypertension: Secondary | ICD-10-CM

## 2021-07-19 MED ORDER — CARVEDILOL 12.5 MG PO TABS: 12.5000 mg | ORAL_TABLET | Freq: Two times a day (BID) | ORAL | 3 refills | Status: DC

## 2021-07-19 NOTE — Patient Instructions (Addendum)
Start taking carvedilol 12.5mg  twice a day  Continue amlodipine 10mg  daily, irbesartan 300mg  daily and chlorthalidone 25mg  daily  Continue checking blood pressure at home  Call me at [phone] with any questions

## 2021-07-19 NOTE — Progress Notes (Signed)
Patient ID: Timothy Golden                 DOB: [birth year]                      MRN: [id number]     HPI: Timothy Golden is a 55 y.o. male referred by Dr. Eldridge Dace to HTN clinic. PMH is significant for  HTN, HLD, DM, obesity multivessel CAD s/p CABG and stroke. Patient underwent CABG on 12/20/20. His losartan dose was decreased to 25mg  daily post op. At follow up visit with Angie Duke his losartan was increased to 50mg  daily and then was increased to 100mg  daily. He sent a my chart message 6/12 that his BP was still high. Amlodipine 5mg  daily was added and he was referred to the HTN clinic.  At initial HTN clinic apt, amlodipine was increased to 10mg  daily. Good discussion on diet improvements was also had. At next visit, chlorthalidone 12.5mg  daily was started and then increased to 25mg  daily. He was seen by Dr. on 8/25 where his metoprolol was changed to carvedilol 6.25mg  twice a day. Patient was lost to follow up briefly.   He messaged the HTN clinic a few weeks ago about his high blood pressure. Losartan was changed to irbesartan 300mg  daily.  Patient presents to clinic today. He states that he is feeling better on the irbesartan. His BP has been in the 140's/70's for the last week or two. Down from the 170's. He had stopped walking because he was afraid to raise his BP more, but he has resumed walking about 1-1.5 miles daily for the last week.   He has temporary insurance that only covers 8/12. His new insurance kicks in Dec 12.  Current HTN meds: amlodipine 10mg  daily, irbesartan 300mg  daily, carvedilol 6.25mg  twice a day, chlorthalidone 25mg  daily Previously tried: atenolol, HCTZ BP goal: <130/80  Family History: The patient's family history includes Diabetes Mellitus I in his father; Hypertension in his father.  Social History: never smoked, drinks socially   Diet: eats out once a week, doesn't use salt on food Breakfast:1 toast with butter and PB or eggs Lunch: left overs  (steak/chicken brocolli) chicken and cheese quesedilla, Dinner: frozen pizza, meat and vegetable Snack: banana, pear, nectarine, sometimes a cookie or peanuts/mix nuts Drink: diet coke, diet snapple, Gatorade zero, occasional water  Exercise: walks 1.5 miles daily  Home BP readings: 143/71  Wt Readings from Last 3 Encounters:  04/14/21 284 lb 12.8 oz (129.2 kg)  01/19/21 283 lb (128.4 kg)  01/11/21 282 lb 12.8 oz (128.3 kg)   BP Readings from Last 3 Encounters:  04/14/21 (!) 150/80  03/22/21 138/62  03/08/21 140/62   Pulse Readings from Last 3 Encounters:  04/14/21 83  03/22/21 82  03/08/21 82    Renal function: CrCl cannot be calculated (Patient's most recent lab result is older than the maximum 21 days allowed.).  Past Medical History:  Diagnosis Date   Diabetes mellitus without complication (HCC)    HLD (hyperlipidemia)    Hypertension    Obesity    Stroke [facility])     Current Outpatient Medications on File Prior to Visit  Medication Sig Dispense Refill   acetaminophen (TYLENOL) 325 MG tablet Take 2 tablets (650 mg total) by mouth every 6 (six) hours as needed for mild pain.     amLODipine (NORVASC) 10 MG tablet Take 1 tablet (10 mg total) by mouth daily. 90 tablet 3  aspirin 81 MG EC tablet Take 81 mg by mouth daily.     atorvastatin (LIPITOR) 80 MG tablet Take 1 tablet (80 mg total) by mouth daily. 30 tablet 3   carvedilol (COREG) 6.25 MG tablet Take 1 tablet (6.25 mg total) by mouth 2 (two) times daily. 180 tablet 3   chlorthalidone (HYGROTON) 25 MG tablet Take 1 tablet (25 mg total) by mouth daily. 90 tablet 3   clopidogrel (PLAVIX) 75 MG tablet Take 1 tablet (75 mg total) by mouth daily. 90 tablet 3   HUMALOG 100 UNIT/ML injection Inject into the skin 3 (three) times daily with meals. Sliding Scale     irbesartan (AVAPRO) 300 MG tablet Take 1 tablet (300 mg total) by mouth daily. 90 tablet 3   metFORMIN (GLUCOPHAGE-XR) 500 MG 24 hr tablet Take 500 mg by mouth 2  (two) times daily.     NOVOLOG 100 UNIT/ML injection Inject 10-30 Units into the skin 3 (three) times daily with meals. Sliding Scale     TOUJEO MAX SOLOSTAR 300 UNIT/ML Solostar Pen Inject 20 Units into the skin daily.     No current facility-administered medications on file prior to visit.    Allergies  Allergen Reactions   Ace Inhibitors     Other reaction(s): Cough (ALLERGY/intolerance)   Shellfish Allergy Rash    hives    There were no vitals taken for this visit.   Assessment/Plan:  1. Hypertension - Blood pressure is above goal of <130/80 in clinic. Improved with the change to irbesartan. Will increase carvedilol to 12.5mg  BID. Since pt does not have insurance that will cover labs will delay checking labs until next apt. Continue amlodipine 10mg  daily, irbesartan 300mg  daily and chlorthalidone 25mg  daily. Follow up in 3 weeks 12/20. Will drawn a BMP and a uric acid level at next visit. Patient has had HTN since a young age. He is on 4 medications and still with HTN. There is some evidence that uric acid could be a cause of HTN, will check uric acid level at next apt.  Thank you  , Pharm.D, BCPS, CPP [facility]  [address] [place], [place] [address]  Phone: [phone]; Fax: [phone]

## 2021-08-09 ENCOUNTER — Ambulatory Visit: Payer: Self-pay

## 2021-09-05 ENCOUNTER — Encounter: Payer: Self-pay | Admitting: Pharmacist

## 2021-09-05 MED ORDER — ATORVASTATIN CALCIUM 80 MG PO TABS: 80.0000 mg | ORAL_TABLET | Freq: Every day | ORAL | 3 refills | Status: DC

## 2021-09-08 ENCOUNTER — Ambulatory Visit: Payer: Self-pay

## 2021-09-22 ENCOUNTER — Encounter: Payer: Self-pay | Admitting: Pharmacist

## 2021-09-22 ENCOUNTER — Other Ambulatory Visit: Payer: Self-pay

## 2021-09-22 ENCOUNTER — Ambulatory Visit (INDEPENDENT_AMBULATORY_CARE_PROVIDER_SITE_OTHER): Payer: 59 | Admitting: Pharmacist

## 2021-09-22 VITALS — BP 142/60 | HR 63

## 2021-09-22 DIAGNOSIS — I10 Essential (primary) hypertension: Secondary | ICD-10-CM | POA: Diagnosis not present

## 2021-09-22 NOTE — Patient Instructions (Addendum)
I will call you tomorrow with your lab results- if they look good we will add spironolactone 12.5mg  daily Continue amlodipine 10mg  daily, irbesartan 300mg  daily, carvedilol 12.5mg  twice a day, chlorthalidone 25mg  daily

## 2021-09-22 NOTE — Progress Notes (Signed)
Patient ID: Timothy Golden                 DOB: [birth year]                      MRN: [id number]     HPI: Timothy Golden is a 56 y.o. male referred by Dr. Eldridge Dace to HTN clinic. PMH is significant for  HTN, HLD, DM, obesity multivessel CAD s/p CABG and stroke. Patient underwent CABG on 12/20/20. His losartan dose was decreased to 25mg  daily post op. At follow up visit with Angie Duke his losartan was increased to 50mg  daily and then was increased to 100mg  daily. He sent a my chart message 6/12 that his BP was still high. Amlodipine 5mg  daily was added and he was referred to the HTN clinic.  At initial HTN clinic apt, amlodipine was increased to 10mg  daily. Good discussion on diet improvements was also had. At next visit, chlorthalidone 12.5mg  daily was started and then increased to 25mg  daily. He was seen by Dr. on 8/25 where his metoprolol was changed to carvedilol 6.25mg  twice a day. Patient was lost to follow up briefly.   He messaged the HTN clinic about his high blood pressure. Losartan was changed to irbesartan 300mg  daily. He was seen in clinic in Dec 2022 and carvedilol was increased to 12.5mg  twice a day.  Patient presents today to clinic. Denies dizziness, lightheadedness, headache, blurred vision, SOB or swelling. He is walking 1-1.71miles about 5 days a week. BP still in the 140's/68-70. HR in the 60-70. Compliant with his medications. Started a new job in December. He is enjoying it. Less stressful.  Current HTN meds: amlodipine 10mg  daily, irbesartan 300mg  daily, carvedilol 12.5mg  twice a day, chlorthalidone 25mg  daily Previously tried: atenolol, HCTZ BP goal: <130/80  Family History: The patient's family history includes Diabetes Mellitus I in his father; Hypertension in his father.  Social History: never smoked, drinks socially   Diet: eats out once a week, doesn't use salt on food Breakfast:1 toast with butter and PB or eggs Lunch: left overs (steak/chicken brocolli) chicken  and cheese quesedilla, Dinner: frozen pizza, meat and vegetable Snack: banana, pear, nectarine, sometimes a cookie or peanuts/mix nuts Drink: diet coke, diet snapple, Gatorade zero, occasional water  Exercise: walks 1-1.5 miles x 5 days a week  Home BP readings: 140's/68-70 HR 60-70  Wt Readings from Last 3 Encounters:  04/14/21 284 lb 12.8 oz (129.2 kg)  01/19/21 283 lb (128.4 kg)  01/11/21 282 lb 12.8 oz (128.3 kg)   BP Readings from Last 3 Encounters:  07/19/21 136/76  04/14/21 (!) 150/80  03/22/21 138/62   Pulse Readings from Last 3 Encounters:  07/19/21 70  04/14/21 83  03/22/21 82    Renal function: CrCl cannot be calculated (Patient's most recent lab result is older than the maximum 21 days allowed.).  Past Medical History:  Diagnosis Date   Diabetes mellitus without complication (HCC)    HLD (hyperlipidemia)    Hypertension    Obesity    Stroke [facility])     Current Outpatient Medications on File Prior to Visit  Medication Sig Dispense Refill   acetaminophen (TYLENOL) 325 MG tablet Take 2 tablets (650 mg total) by mouth every 6 (six) hours as needed for mild pain.     amLODipine (NORVASC) 10 MG tablet Take 1 tablet (10 mg total) by mouth daily. 90 tablet 3   aspirin 81 MG EC tablet Take 81 mg by  mouth daily.     atorvastatin (LIPITOR) 80 MG tablet Take 1 tablet (80 mg total) by mouth daily. 30 tablet 3   carvedilol (COREG) 12.5 MG tablet Take 1 tablet (12.5 mg total) by mouth 2 (two) times daily. 180 tablet 3   chlorthalidone (HYGROTON) 25 MG tablet Take 1 tablet (25 mg total) by mouth daily. 90 tablet 3   clopidogrel (PLAVIX) 75 MG tablet Take 1 tablet (75 mg total) by mouth daily. 90 tablet 3   HUMALOG 100 UNIT/ML injection Inject into the skin 3 (three) times daily with meals. Sliding Scale     irbesartan (AVAPRO) 300 MG tablet Take 1 tablet (300 mg total) by mouth daily. 90 tablet 3   metFORMIN (GLUCOPHAGE-XR) 500 MG 24 hr tablet Take 500 mg by mouth 2 (two)  times daily.     NOVOLOG 100 UNIT/ML injection Inject 10-30 Units into the skin 3 (three) times daily with meals. Sliding Scale (Patient not taking: Reported on 07/19/2021)     TOUJEO MAX SOLOSTAR 300 UNIT/ML Solostar Pen Inject 30 Units into the skin daily.     No current facility-administered medications on file prior to visit.    Allergies  Allergen Reactions   Ace Inhibitors     Other reaction(s): Cough (ALLERGY/intolerance)   Shellfish Allergy Rash    hives    There were no vitals taken for this visit.   Assessment/Plan:  1. Hypertension - Blood pressure is above goal of <130/80 in clinic. Will check BMP and uric acid today to rule this out as cause of elevated BP. If scr and K are good, will plan to start spironolactone 12.5mg  daily. Continue amlodipine 10mg  daily, irbesartan 300mg  daily, carvedilol 12.5mg  twice a day and chlorthalidone 25mg  daily. I will call patient tomorrow after labs result to finalize plan. He will need follow up appointment scheduled at that time.   Thank you  , Pharm.D, BCPS, CPP [facility]  [address] [place], [place] [address]  Phone: [phone]; Fax: [phone]

## 2021-09-23 ENCOUNTER — Telehealth: Payer: Self-pay | Admitting: Pharmacist

## 2021-09-23 DIAGNOSIS — I10 Essential (primary) hypertension: Secondary | ICD-10-CM

## 2021-09-23 DIAGNOSIS — E1069 Type 1 diabetes mellitus with other specified complication: Secondary | ICD-10-CM

## 2021-09-23 LAB — BASIC METABOLIC PANEL
BUN/Creatinine Ratio: 30 — ABNORMAL HIGH (ref 9–20)
BUN: 25 mg/dL — ABNORMAL HIGH (ref 6–24)
CO2: 23 mmol/L (ref 20–29)
Calcium: 9.3 mg/dL (ref 8.7–10.2)
Chloride: 103 mmol/L (ref 96–106)
Creatinine, Ser: 0.82 mg/dL (ref 0.76–1.27)
Glucose: 139 mg/dL — ABNORMAL HIGH (ref 70–99)
Potassium: 4 mmol/L (ref 3.5–5.2)
Sodium: 139 mmol/L (ref 134–144)
eGFR: 104 mL/min/{1.73_m2} (ref >59)

## 2021-09-23 LAB — URIC ACID: Uric Acid: 5.8 mg/dL (ref 3.8–8.4)

## 2021-09-23 MED ORDER — ALLOPURINOL 100 MG PO TABS: 50.0000 mg | ORAL_TABLET | Freq: Every day | ORAL | 1 refills | Status: DC

## 2021-09-23 MED ORDER — SPIRONOLACTONE 25 MG PO TABS: 12.5000 mg | ORAL_TABLET | Freq: Every day | ORAL | 3 refills | Status: DC

## 2021-09-23 NOTE — Telephone Encounter (Signed)
BMP stable to start spironolactone 12.5mg  daily. Uric acid >5.5. Will start allopurinol 50mg  daily.  Check BMP/A1C on 2/13.  F/u in clinic for BP and uric acid on 3/2.

## 2021-10-03 ENCOUNTER — Other Ambulatory Visit: Payer: 59

## 2021-10-06 ENCOUNTER — Other Ambulatory Visit: Payer: 59

## 2021-10-06 ENCOUNTER — Other Ambulatory Visit: Payer: Self-pay

## 2021-10-06 DIAGNOSIS — I10 Essential (primary) hypertension: Secondary | ICD-10-CM

## 2021-10-06 DIAGNOSIS — E1069 Type 1 diabetes mellitus with other specified complication: Secondary | ICD-10-CM

## 2021-10-06 LAB — HEMOGLOBIN A1C
Est. average glucose Bld gHb Est-mCnc: 163 mg/dL
Hgb A1c MFr Bld: 7.3 % — ABNORMAL HIGH (ref 4.8–5.6)

## 2021-10-06 LAB — BASIC METABOLIC PANEL
BUN/Creatinine Ratio: 28 — ABNORMAL HIGH (ref 9–20)
BUN: 24 mg/dL (ref 6–24)
CO2: 19 mmol/L — ABNORMAL LOW (ref 20–29)
Calcium: 9.3 mg/dL (ref 8.7–10.2)
Chloride: 100 mmol/L (ref 96–106)
Creatinine, Ser: 0.86 mg/dL (ref 0.76–1.27)
Glucose: 243 mg/dL — ABNORMAL HIGH (ref 70–99)
Potassium: 4.5 mmol/L (ref 3.5–5.2)
Sodium: 134 mmol/L (ref 134–144)
eGFR: 102 mL/min/{1.73_m2} (ref >59)

## 2021-10-14 ENCOUNTER — Telehealth: Payer: Self-pay | Admitting: Pharmacist

## 2021-10-14 NOTE — Telephone Encounter (Signed)
LVM to review labs with patient. Labs stable. A1C improved. Glucose on labs high.

## 2021-10-16 NOTE — Progress Notes (Signed)
Cardiology Office Note   Date:  10/18/2021   ID:  Timothy Golden, DOB [birth year], MRN [id number]  PCP:  Raynelle Bring [facility]    Chief Complaint  Patient presents with   Follow-up   CAD  Wt Readings from Last 3 Encounters:  10/18/21 295 lb (133.8 kg)  04/14/21 284 lb 12.8 oz (129.2 kg)  01/19/21 283 lb (128.4 kg)       History of Present Illness: Timothy Golden is a 56 y.o. male    with a hx of HTN, HLD, DM1 and obesity who presented to the ED 12/16/2020 with chest pain.  Approximately 2 hours prior to arrival in the emergency department he had an episode of heaviness in his chest after getting up.  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  Pt underwent CABG x4 (LIMA to LAD, SVG-Diag, SVG-Ramus, SVG-OM).  Preop studies showed EF 60-65% and bilateral ICA stenosis (60-70%).  Dr. Myra Gianotti (vascular) recommended f/u carotid duplex 6 months post CABG.  Long time DM since 56 y/o.  Lost weight since CABG.  Working on getting A1C down.  8.2 in 11/2020.  Denies : Chest pain. Dizziness. Leg edema. Nitroglycerin use. Orthopnea. Palpitations. Paroxysmal nocturnal dyspnea. Shortness of breath. Syncope.    Gained some weight.    Walks 20 minutes 6-7 days /week.      Past Medical History:  Diagnosis Date   Diabetes mellitus without complication (HCC)    HLD (hyperlipidemia)    Hypertension    Obesity    Stroke [facility])     Past Surgical History:  Procedure Laterality Date   CORONARY ARTERY BYPASS GRAFT N/A 12/20/2020   Procedure: CORONARY ARTERY BYPASS GRAFTING (CABG) x FOUR, USING LEFT INTERNAL MAMMARY ARTERY AND RIGHT LEG GREATER SAPHENOUS VEIN HARVESTED ENDOSCOPICALLY;  Surgeon: Alleen Borne, MD;  Location: [facility] OR;  Service: Open Heart Surgery;  Laterality: N/A;   EYE SURGERY     LEFT HEART CATH AND CORONARY ANGIOGRAPHY N/A 12/16/2020   Procedure: LEFT HEART CATH AND CORONARY ANGIOGRAPHY;  Surgeon: Corky Crafts, MD;  Location: [facility] INVASIVE CV LAB;   Service: Cardiovascular;  Laterality: N/A;   SHOULDER SURGERY     TEE WITHOUT CARDIOVERSION N/A 12/20/2020   Procedure: TRANSESOPHAGEAL ECHOCARDIOGRAM (TEE);  Surgeon: Alleen Borne, MD;  Location: [facility] OR;  Service: Open Heart Surgery;  Laterality: N/A;     Current Outpatient Medications  Medication Sig Dispense Refill   acetaminophen (TYLENOL) 325 MG tablet Take 2 tablets (650 mg total) by mouth every 6 (six) hours as needed for mild pain.     allopurinol (ZYLOPRIM) 100 MG tablet Take 0.5 tablets (50 mg total) by mouth daily. 90 tablet 1   amLODipine (NORVASC) 10 MG tablet Take 1 tablet (10 mg total) by mouth daily. 90 tablet 3   aspirin 81 MG EC tablet Take 81 mg by mouth daily.     atorvastatin (LIPITOR) 80 MG tablet Take 1 tablet (80 mg total) by mouth daily. 30 tablet 3   carvedilol (COREG) 12.5 MG tablet Take 1 tablet (12.5 mg total) by mouth 2 (two) times daily. 180 tablet 3   chlorthalidone (HYGROTON) 25 MG tablet Take 1 tablet (25 mg total) by mouth daily. 90 tablet 3   clopidogrel (PLAVIX) 75 MG tablet Take 1 tablet (75 mg total) by mouth daily. 90 tablet 3   HUMALOG 100 UNIT/ML injection Inject into the skin 3 (three) times daily with meals. Sliding Scale     irbesartan (  AVAPRO) 300 MG tablet Take 1 tablet (300 mg total) by mouth daily. 90 tablet 3   metFORMIN (GLUCOPHAGE-XR) 500 MG 24 hr tablet Take 500 mg by mouth 2 (two) times daily.     spironolactone (ALDACTONE) 25 MG tablet Take 0.5 tablets (12.5 mg total) by mouth daily. 45 tablet 3   TOUJEO MAX SOLOSTAR 300 UNIT/ML Solostar Pen Inject 35-40 Units into the skin daily.     No current facility-administered medications for this visit.    Allergies:   Ace inhibitors and Shellfish allergy    Social History:  The patient  reports that he has never smoked. He has never used smokeless tobacco. He reports that he does not drink alcohol and does not use drugs.   Family History:  The patient's family history includes Diabetes  Mellitus I in his father; Hypertension in his father.    ROS:  Please see the history of present illness.   Otherwise, review of systems are positive for weight gain.   All other systems are reviewed and negative.    PHYSICAL EXAM: VS:  BP (!) 142/82    Pulse 76    Ht 6' (1.829 m)    Wt 295 lb (133.8 kg)    SpO2 97%    BMI 40.01 kg/m  , BMI Body mass index is 40.01 kg/m. GEN: Well nourished, well developed, in no acute distress HEENT: normal Neck: no JVD, carotid bruits, or masses Cardiac: RRR; no murmurs, rubs, or gallops,no edema  Respiratory:  clear to auscultation bilaterally, normal work of breathing GI: soft, nontender, nondistended, + BS, obese MS: no deformity or atrophy Skin: warm and dry, bilateral LE venous stasis discoloration Neuro:  Strength and sensation are intact Psych: euthymic mood, full affect   EKG:   The ekg ordered today demonstrates NSR, septal Q waves, nonspecific ST changes   Recent Labs: 12/20/2020: ALT 29 12/21/2020: Magnesium 2.3 12/23/2020: Hemoglobin 11.2; Platelets 228 10/06/2021: BUN 24; Creatinine, Ser 0.86; Potassium 4.5; Sodium 134   Lipid Panel    Component Value Date/Time   CHOL 156 12/17/2020 0739   TRIG 94 12/17/2020 0739   HDL 34 (L) 12/17/2020 0739   CHOLHDL 4.6 12/17/2020 0739   VLDL 19 12/17/2020 0739   LDLCALC 103 (H) 12/17/2020 0739     Other studies Reviewed: Additional studies/ records that were reviewed today with results demonstrating: labs reviewed.   ASSESSMENT AND PLAN:  CAD: s/p CABG.  No angina on medical therapy. Continue aggressive secondary prevention.  We spoke about improving diet and getting more exercise. HTN: High BP readings at home. Will increase coreg to 25 mg BID. followed in the Pharm.D. hypertension clinic. Hyperlipidemia: LDL 103 in 11/2020. Continue high dose atorvastatin. Needs recheck of lipids, liver when fasting.   DM1: A1C 7.3 per his report.  Whole food.  Plant-based eating lots of fiber.Avoiding  processed foods.  Will also recheck electrolytes since he was started on spironolactone.  No other side effects noted.  Carotid artery disease: sees Dr. Myra Gianotti.  Refer for carotid DOppler at [facility] and appt with Dr. Myra Gianotti.  He did not have his f/u carotid DOppler.    Current medicines are reviewed at length with the patient today.  The patient concerns regarding his medicines were addressed.  The following changes have been made: As above  Labs/ tests ordered today include:  No orders of the defined types were placed in this encounter.   Recommend 150 minutes/week of aerobic exercise Low fat, low carb, high  fiber diet recommended  Disposition:   FU in 6 months   Signed, Lance Muss, MD  10/18/2021 4:24 PM    [facility] [address] [place], [place], [place]  [address] Phone: [phone]; Fax: [phone]

## 2021-10-18 ENCOUNTER — Other Ambulatory Visit: Payer: Self-pay

## 2021-10-18 ENCOUNTER — Ambulatory Visit: Payer: 59 | Admitting: Interventional Cardiology

## 2021-10-18 ENCOUNTER — Encounter: Payer: Self-pay | Admitting: Interventional Cardiology

## 2021-10-18 VITALS — BP 142/82 | HR 76 | Ht 72.0 in | Wt 295.0 lb

## 2021-10-18 DIAGNOSIS — I6523 Occlusion and stenosis of bilateral carotid arteries: Secondary | ICD-10-CM

## 2021-10-18 DIAGNOSIS — I10 Essential (primary) hypertension: Secondary | ICD-10-CM

## 2021-10-18 DIAGNOSIS — I2510 Atherosclerotic heart disease of native coronary artery without angina pectoris: Secondary | ICD-10-CM | POA: Diagnosis not present

## 2021-10-18 DIAGNOSIS — Z951 Presence of aortocoronary bypass graft: Secondary | ICD-10-CM | POA: Diagnosis not present

## 2021-10-18 DIAGNOSIS — E1069 Type 1 diabetes mellitus with other specified complication: Secondary | ICD-10-CM

## 2021-10-18 DIAGNOSIS — E785 Hyperlipidemia, unspecified: Secondary | ICD-10-CM

## 2021-10-18 MED ORDER — CARVEDILOL 25 MG PO TABS: 25.0000 mg | ORAL_TABLET | Freq: Two times a day (BID) | ORAL | 3 refills | Status: DC

## 2021-10-18 NOTE — Patient Instructions (Signed)
Medication Instructions:  Your physician has recommended you make the following change in your medication: Increase Carvedilol to 25 mg by mouth twice daily  *If you need a refill on your cardiac medications before your next appointment, please call your pharmacy*   Lab Work: Your physician recommends that you return for lab work on October 27, 2021.  CMET and lipids.  This will be fasting.  The lab opens at 7:15  If you have labs (blood work) drawn today and your tests are completely normal, you will receive your results only by: MyChart Message (if you have MyChart) OR A paper copy in the mail If you have any lab test that is abnormal or we need to change your treatment, we will call you to review the results.   Testing/Procedures: none   Follow-Up: At [facility], you and your health needs are our priority.  As part of our continuing mission to provide you with exceptional heart care, we have created designated Provider Care Teams.  These Care Teams include your primary Cardiologist (physician) and Advanced Practice Providers (APPs -  Physician Assistants and Nurse Practitioners) who all work together to provide you with the care you need, when you need it.  We recommend signing up for the patient portal called "MyChart".  Sign up information is provided on this After Visit Summary.  MyChart is used to connect with patients for Virtual Visits (Telemedicine).  Patients are able to view lab/test results, encounter notes, upcoming appointments, etc.  Non-urgent messages can be sent to your provider as well.   To learn more about what you can do with MyChart, go to [email or web address].    Your next appointment:   6 month(s)  The format for your next appointment:   In Person  Provider:   Lance Muss, MD     Other Instructions You have been referred to Dr Myra Gianotti

## 2021-10-20 ENCOUNTER — Ambulatory Visit: Payer: 59

## 2021-10-20 NOTE — Progress Notes (Deleted)
Patient ID: Timothy Golden                 DOB: Apr 17, 1966                      MRN: [id number] ? ? ? ? ?HPI: ?Timothy Golden is a 56 y.o. male referred by Dr. Eldridge Dace to HTN clinic. PMH is significant for  HTN, HLD, DM, obesity multivessel CAD s/p CABG and stroke. Patient underwent CABG on 12/20/20. His losartan dose was decreased to 25mg  daily post op. At follow up visit with Angie Duke his losartan was increased to 50mg  daily and then was increased to 100mg  daily. He sent a my chart message 6/12 that his BP was still high. Amlodipine 5mg  daily was added and he was referred to the HTN clinic. ? ?At initial HTN clinic apt, amlodipine was increased to 10mg  daily. Good discussion on diet improvements was also had. At next visit, chlorthalidone 12.5mg  daily was started and then increased to 25mg  daily. He was seen by Dr. on 8/25 where his metoprolol was changed to carvedilol 6.25mg  twice a day. Patient was lost to follow up briefly.  ? ?He messaged the HTN clinic about his high blood pressure. Losartan was changed to irbesartan 300mg  daily. He was seen in clinic in Dec 2022 and carvedilol was increased to 12.5mg  twice a day. ? ?At last visit with PharmD, spironolactone 12.5mg  daily and allopurinol 50mg  daily were started. He saw Dr. on 2/28. Carvedilol was increased to 25mg  twice a day. ? ? ?Patient presents today to clinic. Denies dizziness, lightheadedness, headache, blurred vision, SOB or swelling. He is walking 1-1.71miles about 5 days a week. BP still in the 140's/68-70. HR in the 60-70. Compliant with his medications. Started a new job in December. He is enjoying it. Less stressful. ? ?Current HTN meds: amlodipine 10mg  daily, irbesartan 300mg  daily, carvedilol 12.5mg  twice a day, chlorthalidone 25mg  daily ?Previously tried: atenolol, HCTZ ?BP goal: <130/80 ? ?Family History: The patient's family history includes Diabetes Mellitus I in his father; Hypertension in his father. ? ?Social History: never  smoked, drinks socially  ? ?Diet: eats out once a week, doesn't use salt on food ?Breakfast:1 toast with butter and PB or eggs ?Lunch: left overs (steak/chicken brocolli) chicken and cheese quesedilla, ?Dinner: frozen pizza, meat and vegetable ?Snack: banana, pear, nectarine, sometimes a cookie or peanuts/mix nuts ?Drink: diet coke, diet snapple, Gatorade zero, occasional water ? ?Exercise: walks 1-1.5 miles x 5 days a week ? ?Home BP readings: 140's/68-70 HR 60-70 ? ?Wt Readings from Last 3 Encounters:  ?10/18/21 295 lb (133.8 kg)  ?04/14/21 284 lb 12.8 oz (129.2 kg)  ?01/19/21 283 lb (128.4 kg)  ? ?BP Readings from Last 3 Encounters:  ?10/18/21 (!) 142/82  ?09/22/21 (!) 142/60  ?07/19/21 136/76  ? ?Pulse Readings from Last 3 Encounters:  ?10/18/21 76  ?09/22/21 63  ?07/19/21 70  ? ? ?Renal function: ?Estimated Creatinine Clearance: 135.8 mL/min (by C-G formula based on SCr of 0.86 mg/dL). ? ?Past Medical History:  ?Diagnosis Date  ? Diabetes mellitus without complication (HCC)   ? HLD (hyperlipidemia)   ? Hypertension   ? Obesity   ? Stroke [facility])   ? ? ?Current Outpatient Medications on File Prior to Visit  ?Medication Sig Dispense Refill  ? acetaminophen (TYLENOL) 325 MG tablet Take 2 tablets (650 mg total) by mouth every 6 (six) hours as needed for mild pain.    ? allopurinol (ZYLOPRIM)  100 MG tablet Take 0.5 tablets (50 mg total) by mouth daily. 90 tablet 1  ? amLODipine (NORVASC) 10 MG tablet Take 1 tablet (10 mg total) by mouth daily. 90 tablet 3  ? aspirin 81 MG EC tablet Take 81 mg by mouth daily.    ? atorvastatin (LIPITOR) 80 MG tablet Take 1 tablet (80 mg total) by mouth daily. 30 tablet 3  ? carvedilol (COREG) 25 MG tablet Take 1 tablet (25 mg total) by mouth 2 (two) times daily. 180 tablet 3  ? chlorthalidone (HYGROTON) 25 MG tablet Take 1 tablet (25 mg total) by mouth daily. 90 tablet 3  ? clopidogrel (PLAVIX) 75 MG tablet Take 1 tablet (75 mg total) by mouth daily. 90 tablet 3  ? HUMALOG 100 UNIT/ML  injection Inject into the skin 3 (three) times daily with meals. Sliding Scale    ? irbesartan (AVAPRO) 300 MG tablet Take 1 tablet (300 mg total) by mouth daily. 90 tablet 3  ? metFORMIN (GLUCOPHAGE-XR) 500 MG 24 hr tablet Take 500 mg by mouth 2 (two) times daily.    ? spironolactone (ALDACTONE) 25 MG tablet Take 0.5 tablets (12.5 mg total) by mouth daily. 45 tablet 3  ? TOUJEO MAX SOLOSTAR 300 UNIT/ML Solostar Pen Inject 35-40 Units into the skin daily.    ? ?No current facility-administered medications on file prior to visit.  ? ? ?Allergies  ?Allergen Reactions  ? Ace Inhibitors   ?  Other reaction(s): Cough (ALLERGY/intolerance)  ? Shellfish Allergy Rash  ?  hives  ? ? ?There were no vitals taken for this visit. ? ? ?Assessment/Plan: ? ?1. Hypertension - Blood pressure is above goal of <130/80 in clinic. Will check BMP and uric acid today to rule this out as cause of elevated BP. If scr and K are good, will plan to start spironolactone 12.5mg  daily. Continue amlodipine 10mg  daily, irbesartan 300mg  daily, carvedilol 12.5mg  twice a day and chlorthalidone 25mg  daily. I will call patient tomorrow after labs result to finalize plan. He will need follow up appointment scheduled at that time. ? ? ?Thank you ? ? , Pharm.D, BCPS, CPP ?[facility]  ?[address], [place], [place] [address]  ?Phone: [phone]; Fax: [phone]  ? ? ?

## 2021-10-27 ENCOUNTER — Other Ambulatory Visit: Payer: Self-pay

## 2021-10-27 ENCOUNTER — Other Ambulatory Visit: Payer: 59

## 2021-10-27 DIAGNOSIS — I10 Essential (primary) hypertension: Secondary | ICD-10-CM

## 2021-10-27 DIAGNOSIS — E785 Hyperlipidemia, unspecified: Secondary | ICD-10-CM

## 2021-10-27 DIAGNOSIS — Z951 Presence of aortocoronary bypass graft: Secondary | ICD-10-CM

## 2021-10-27 DIAGNOSIS — I2510 Atherosclerotic heart disease of native coronary artery without angina pectoris: Secondary | ICD-10-CM

## 2021-11-01 ENCOUNTER — Telehealth: Payer: Self-pay

## 2021-11-01 LAB — COMPREHENSIVE METABOLIC PANEL
ALT: 24 IU/L (ref 0–44)
AST: 26 IU/L (ref 0–40)
Albumin/Globulin Ratio: 1.8 (ref 1.2–2.2)
Albumin: 4.3 g/dL (ref 3.8–4.9)
Alkaline Phosphatase: 93 IU/L (ref 44–121)
BUN/Creatinine Ratio: 30 — ABNORMAL HIGH (ref 9–20)
BUN: 24 mg/dL (ref 6–24)
Bilirubin Total: 0.5 mg/dL (ref 0.0–1.2)
CO2: 18 mmol/L — ABNORMAL LOW (ref 20–29)
Calcium: 9.7 mg/dL (ref 8.7–10.2)
Chloride: 106 mmol/L (ref 96–106)
Creatinine, Ser: 0.8 mg/dL (ref 0.76–1.27)
Globulin, Total: 2.4 g/dL (ref 1.5–4.5)
Glucose: 81 mg/dL (ref 70–99)
Potassium: 4.7 mmol/L (ref 3.5–5.2)
Sodium: 140 mmol/L (ref 134–144)
Total Protein: 6.7 g/dL (ref 6.0–8.5)
eGFR: 104 mL/min/{1.73_m2} (ref >59)

## 2021-11-01 LAB — LIPID PANEL
Chol/HDL Ratio: 4 ratio (ref 0.0–5.0)
Cholesterol, Total: 153 mg/dL (ref 100–199)
HDL: 38 mg/dL — ABNORMAL LOW (ref >39)
LDL Chol Calc (NIH): 97 mg/dL (ref 0–99)
Triglycerides: 98 mg/dL (ref 0–149)
VLDL Cholesterol Cal: 18 mg/dL (ref 5–40)

## 2021-11-01 NOTE — Telephone Encounter (Signed)
Lmom pt for Chst pharmd appt need in 3wk ?

## 2021-11-01 NOTE — Telephone Encounter (Signed)
-----   Message from [place], RPH-CPP sent at 11/01/2021  3:36 PM EDT ----- ?Will you schedule him f/u with me in about 3 weeks? thanks ?----- Message ----- ?From: Corky Crafts, MD ?Sent: 10/18/2021   5:08 PM EDT ?To: [place], RPH-CPP ? ?Increasing Coreg to 25 mg BID.   ? ?

## 2021-11-21 ENCOUNTER — Telehealth: Payer: Self-pay | Admitting: *Deleted

## 2021-11-21 DIAGNOSIS — E782 Mixed hyperlipidemia: Secondary | ICD-10-CM

## 2021-11-21 MED ORDER — EZETIMIBE 10 MG PO TABS: 10.0000 mg | ORAL_TABLET | Freq: Every day | ORAL | 3 refills | Status: DC

## 2021-11-21 NOTE — Telephone Encounter (Signed)
Awilda Metro, RPH-CPP  ?10/28/2021  4:38 PM EST   ?  ?Would be happy to discuss other options with pt in lipid clinic appt if he's interested. Given his premature CAD and DM, would be reasonable to target LDL goal < 55 in which case add-on therapy like PCSK9i may be a more effective option. If he prefers oral option vs injectable, agree with adding on ezetimibe and rechecking labs in 2 months  ? ?

## 2021-11-21 NOTE — Telephone Encounter (Signed)
-----   Message from Corky Crafts, MD sent at 10/28/2021  4:29 PM EST ----- ?WOuld ad Zetia 10 mg daily, unless he is a candidate for more advance lipid lowering agent.  Lipids and liver in2-3 months ?

## 2021-11-21 NOTE — Telephone Encounter (Signed)
I spoke with patient and gave him information from Dr Eldridge Dace and pharmacist.  He would like to try Zetia.  Prescription sent to [facility] on [address].  He will come in for fasting lab work on January 23, 2022.  ?

## 2021-11-24 NOTE — Progress Notes (Unsigned)
Patient ID: Timothy Golden                 DOB: 31-Jan-1966                      MRN: [id number] ? ? ? ? ?HPI: ?Timothy Golden is a 56 y.o. male referred by Dr. Irish Golden to HTN clinic. PMH is significant for HTN, HLD, DM, obesity, multivessel CAD s/p CABG and stroke. Patient underwent 4-vessel CABG on 12/20/20. His losartan dose was decreased to 25mg  daily post op. At follow up visit with Timothy Golden, his losartan was increased to 50mg  daily and later 100mg  daily. He sent a my chart message 6/12 that his BP was still high. Amlodipine 5mg  daily was added and he was referred to the HTN clinic. ? ?At initial HTN clinic apt, amlodipine was increased to 10mg  daily and discussed diet improvements. At next visit, chlorthalidone 12.5mg  daily was started and then increased to 25mg  daily. He was seen by Dr. Irish Golden on 8/25 where his metoprolol was changed to carvedilol 6.25mg  twice a day. Patient was lost to follow up briefly.  ? ?He messaged the HTN clinic about his high blood pressure. Losartan was changed to irbesartan 300mg  daily. He was seen in clinic in Dec 2022 and carvedilol was increased to 12.5mg  twice a day. At next visit in Feb 2023, spironolactone 12.5 mg daily was started and allopurinol 50 mg started for uric acid > 5.5. Three weeks later, BP was still above goal >130/80 and carvedilol was increased to 25 mg BID  ? ?Patient presents today to clinic.  ?***Denies dizziness, lightheadedness, headache, blurred vision, SOB or swelling.  ?He is walking ***miles about *** days a week. BP is in the ***. HR in the ***. ?Compliant with his medications. ? ? ?- adherence ?- symptoms - dizziness, lightheadedness, headache, blurred vision, SOB or swelling ?- exercise - mi/days/week ?- home BP log/readings ?- how is work going - started new job in Dec 2022 and was enjoying it/less stressful ? ?Current HTN meds:  ?Amlodipine 10 mg daily ?Irbesartan 300 mg daily ?Carvedilol 25 mg twice a day ?Chlorthalidone 25 mg daily ?Spironolactone  12.5 mg daily ? ?Previously tried: atenolol, metoprolol, HCTZ, losartan ?BP goal: <130/80 ? ?Family History: Father - DM, HTN  ? ?Social History: never smoked, drinks socially*** ? ?Diet: *** ? ?eats out once a week, doesn't use salt on food ?Breakfast:1 toast with butter and PB or eggs ?Lunch: left overs (steak/chicken brocolli) chicken and cheese quesedilla, ?Dinner: frozen pizza, meat and vegetable ?Snack: banana, pear, nectarine, sometimes a cookie or peanuts/mix nuts ?Drink: diet coke, diet snapple, Gatorade zero, occasional water ? ?Exercise: *** ? ?Home BP readings:  ?09/22/2021 - 140's/68-70 HR 60-70 ? ?Wt Readings from Last 3 Encounters:  ?10/18/21 295 lb (133.8 kg)  ?04/14/21 284 lb 12.8 oz (129.2 kg)  ?01/19/21 283 lb (128.4 kg)  ? ?BP Readings from Last 3 Encounters:  ?10/18/21 (!) 142/82  ?09/22/21 (!) 142/60  ?07/19/21 136/76  ? ?Pulse Readings from Last 3 Encounters:  ?10/18/21 76  ?09/22/21 63  ?07/19/21 70  ? ? ?Renal function: ?CrCl cannot be calculated (Patient's most recent lab result is older than the maximum 21 days allowed.). ? ?Past Medical History:  ?Diagnosis Date  ? Diabetes mellitus without complication ([facility])   ? HLD (hyperlipidemia)   ? Hypertension   ? Obesity   ? Stroke [facility])   ? ? ?Current Outpatient Medications on File Prior to Visit  ?  Medication Sig Dispense Refill  ? acetaminophen (TYLENOL) 325 MG tablet Take 2 tablets (650 mg total) by mouth every 6 (six) hours as needed for mild pain.    ? allopurinol (ZYLOPRIM) 100 MG tablet Take 0.5 tablets (50 mg total) by mouth daily. 90 tablet 1  ? amLODipine (NORVASC) 10 MG tablet Take 1 tablet (10 mg total) by mouth daily. 90 tablet 3  ? aspirin 81 MG EC tablet Take 81 mg by mouth daily.    ? atorvastatin (LIPITOR) 80 MG tablet Take 1 tablet (80 mg total) by mouth daily. 30 tablet 3  ? carvedilol (COREG) 25 MG tablet Take 1 tablet (25 mg total) by mouth 2 (two) times daily. 180 tablet 3  ? chlorthalidone (HYGROTON) 25 MG tablet Take 1  tablet (25 mg total) by mouth daily. 90 tablet 3  ? clopidogrel (PLAVIX) 75 MG tablet Take 1 tablet (75 mg total) by mouth daily. 90 tablet 3  ? ezetimibe (ZETIA) 10 MG tablet Take 1 tablet (10 mg total) by mouth daily. 90 tablet 3  ? HUMALOG 100 UNIT/ML injection Inject into the skin 3 (three) times daily with meals. Sliding Scale    ? irbesartan (AVAPRO) 300 MG tablet Take 1 tablet (300 mg total) by mouth daily. 90 tablet 3  ? metFORMIN (GLUCOPHAGE-XR) 500 MG 24 hr tablet Take 500 mg by mouth 2 (two) times daily.    ? spironolactone (ALDACTONE) 25 MG tablet Take 0.5 tablets (12.5 mg total) by mouth daily. 45 tablet 3  ? TOUJEO MAX SOLOSTAR 300 UNIT/ML Solostar Pen Inject 35-40 Units into the skin daily.    ? ?No current facility-administered medications on file prior to visit.  ? ? ?Allergies  ?Allergen Reactions  ? Ace Inhibitors   ?  Other reaction(s): Cough (ALLERGY/intolerance)  ? Shellfish Allergy Rash  ?  hives  ? ? ?There were no vitals taken for this visit. ? ? ?Assessment/Plan: ? ?1. Hypertension - Blood pressure is above goal of <130/80 in clinic after switching to irbesartan, increasing carvedilol to 25 mg BID and adding low-dose spironolactone.  ? ?Pending BMET, increase spiro vs adding hydral. ?clonidine ? ?2. Hyperlipidemia -  ?- Risk factors of premature CAD and DM and currently above LDL goal of < 55 mg/dL (97 on 10/27/21) on atorvastatin 80 mg daily. Started on ezetimibe 10 mg daily in April 2023 and due for follow-up lipid panel 01/23/2022. ? ? ? ?Thank you ? ?Timothy Golden, PharmD ?PGY1 Pharmacy Resident ?[id number] [address] [place], [place] [address]  ?Phone: [phone]; Fax: [phone]  ?

## 2021-11-28 ENCOUNTER — Ambulatory Visit: Payer: 59

## 2021-12-01 ENCOUNTER — Ambulatory Visit: Payer: 59

## 2021-12-21 ENCOUNTER — Ambulatory Visit: Payer: 59

## 2021-12-26 ENCOUNTER — Other Ambulatory Visit: Payer: Self-pay

## 2021-12-26 DIAGNOSIS — I6529 Occlusion and stenosis of unspecified carotid artery: Secondary | ICD-10-CM

## 2021-12-28 ENCOUNTER — Ambulatory Visit: Payer: 59 | Admitting: Pharmacist

## 2021-12-28 VITALS — BP 144/60 | HR 65

## 2021-12-28 DIAGNOSIS — E1069 Type 1 diabetes mellitus with other specified complication: Secondary | ICD-10-CM

## 2021-12-28 DIAGNOSIS — I10 Essential (primary) hypertension: Secondary | ICD-10-CM

## 2021-12-28 NOTE — Progress Notes (Signed)
Patient ID: Timothy Golden                 DOB: 1966-07-29                      MRN: [id number] ? ? ? ? ?HPI: ?Timothy Golden is a 56 y.o. male referred by Dr. Eldridge Dace to HTN clinic. PMH is significant for HTN, HLD, DM, obesity, multivessel CAD s/p CABG and stroke. Patient underwent 4-vessel CABG on 12/20/20. His losartan dose was decreased to 25mg  daily post op. At follow up visit with , his losartan was increased to 50mg  daily and later 100mg  daily. He sent a my chart message 6/12 that his BP was still high. Amlodipine 5mg  daily was added and he was referred to the HTN clinic. ? ?At initial HTN clinic apt, amlodipine was increased to 10mg  daily and discussed diet improvements. At next visit, chlorthalidone 12.5mg  daily was started and then increased to 25mg  daily. He was seen by Dr. on 8/25 where his metoprolol was changed to carvedilol 6.25mg  twice a day. Patient was lost to follow up briefly.  ? ?He messaged the HTN clinic about his high blood pressure. Losartan was changed to irbesartan 300mg  daily. He was seen in clinic in Dec 2022 and carvedilol was increased to 12.5mg  twice a day. At next visit in Feb 2023, spironolactone 12.5 mg daily was started and allopurinol 50 mg started for uric acid > 5.5. Three weeks later, BP was still above goal >130/80 and carvedilol was increased to 25 mg BID. ? ?Patient presents today to clinic. He was recently started on ozempic. Seeing better blood sugar control. A little nausea and dizziness. Walking about a mile 6 days a week. Compliant with medications. Blood pressure in the high 140's systolic. HR typically around 65. ?Compliant with his medications. ? ?Current HTN meds:  ?Amlodipine 10 mg daily ?Irbesartan 300 mg daily ?Carvedilol 25 mg twice a day ?Chlorthalidone 25 mg daily ?Spironolactone 12.5 mg daily ? ?Previously tried: atenolol, metoprolol, HCTZ, losartan ?BP goal: <130/80 ? ?Family History: Father - DM, HTN  ? ?Social History: never smoked, drinks  socially ? ?Diet: eating less with ozempic ?Salad for lunch 4 days a week ?Burritos, tacos -made at home ?Broccoli, green beans, peas, kale ?Breakfast: english muffin w/ PB and butter ?Drink: diet coke, diet snapple, Gatorade zero, some water ? ?Exercise: walking 6 times a week ? ?Home BP readings:  ?High 140's/79 ? ?Wt Readings from Last 3 Encounters:  ?10/18/21 295 lb (133.8 kg)  ?04/14/21 284 lb 12.8 oz (129.2 kg)  ?01/19/21 283 lb (128.4 kg)  ? ?BP Readings from Last 3 Encounters:  ?10/18/21 (!) 142/82  ?09/22/21 (!) 142/60  ?07/19/21 136/76  ? ?Pulse Readings from Last 3 Encounters:  ?10/18/21 76  ?09/22/21 63  ?07/19/21 70  ? ? ?Renal function: ?CrCl cannot be calculated (Patient's most recent lab result is older than the maximum 21 days allowed.). ? ?Past Medical History:  ?Diagnosis Date  ? Diabetes mellitus without complication ([facility])   ? HLD (hyperlipidemia)   ? Hypertension   ? Obesity   ? Stroke [facility])   ? ? ?Current Outpatient Medications on File Prior to Visit  ?Medication Sig Dispense Refill  ? acetaminophen (TYLENOL) 325 MG tablet Take 2 tablets (650 mg total) by mouth every 6 (six) hours as needed for mild pain.    ? allopurinol (ZYLOPRIM) 100 MG tablet Take 0.5 tablets (50 mg total) by mouth daily.  90 tablet 1  ? amLODipine (NORVASC) 10 MG tablet Take 1 tablet (10 mg total) by mouth daily. 90 tablet 3  ? aspirin 81 MG EC tablet Take 81 mg by mouth daily.    ? atorvastatin (LIPITOR) 80 MG tablet Take 1 tablet (80 mg total) by mouth daily. 30 tablet 3  ? carvedilol (COREG) 25 MG tablet Take 1 tablet (25 mg total) by mouth 2 (two) times daily. 180 tablet 3  ? chlorthalidone (HYGROTON) 25 MG tablet Take 1 tablet (25 mg total) by mouth daily. 90 tablet 3  ? clopidogrel (PLAVIX) 75 MG tablet Take 1 tablet (75 mg total) by mouth daily. 90 tablet 3  ? ezetimibe (ZETIA) 10 MG tablet Take 1 tablet (10 mg total) by mouth daily. 90 tablet 3  ? HUMALOG 100 UNIT/ML injection Inject into the skin 3 (three)  times daily with meals. Sliding Scale    ? irbesartan (AVAPRO) 300 MG tablet Take 1 tablet (300 mg total) by mouth daily. 90 tablet 3  ? metFORMIN (GLUCOPHAGE-XR) 500 MG 24 hr tablet Take 500 mg by mouth 2 (two) times daily.    ? spironolactone (ALDACTONE) 25 MG tablet Take 0.5 tablets (12.5 mg total) by mouth daily. 45 tablet 3  ? TOUJEO MAX SOLOSTAR 300 UNIT/ML Solostar Pen Inject 35-40 Units into the skin daily.    ? ?No current facility-administered medications on file prior to visit.  ? ? ?Allergies  ?Allergen Reactions  ? Ace Inhibitors   ?  Other reaction(s): Cough (ALLERGY/intolerance)  ? Shellfish Allergy Rash  ?  hives  ? ? ?There were no vitals taken for this visit. ? ? ?Assessment/Plan: ? ?1. Hypertension - Blood pressure is above goal of <130/80 in clinic after switching to irbesartan, increasing carvedilol to 25 mg BID and adding low-dose spironolactone. Will check BMP today and long uric acid level to see if allopurinol needs to be increased to 100mg . If uric acid is sufficiently low then we will just discontinue allopurinol since it has not helped BP yet. If K is ok, will plan to increase spironolactone. Follow up in clinic 6/1. I will call patient tomorrow with lab results to review. We also discussed adding in a few days of resistance training. ? ? ?2. Hyperlipidemia -  ?- Risk factors of premature CAD and DM and currently above LDL goal of < 55 mg/dL (97 on 8/1) on atorvastatin 80 mg daily. Started on ezetimibe 10 mg daily in April 2023 and due for follow-up lipid panel 01/23/2022. ? ? ? ?Thank you ? ?03/25/2022, PharmD ?PGY1 Pharmacy Resident ?[facility]  ?[address] [place], [place]  ?Phone: [phone]; Fax: [phone]  ?

## 2021-12-28 NOTE — Patient Instructions (Signed)
I will call you tomorrow with your lab results and we can make changes as needed ?Please try to add some resistance training a few days a week ? ?

## 2021-12-29 ENCOUNTER — Telehealth: Payer: Self-pay | Admitting: Pharmacist

## 2021-12-29 DIAGNOSIS — I10 Essential (primary) hypertension: Secondary | ICD-10-CM

## 2021-12-29 LAB — BASIC METABOLIC PANEL
BUN/Creatinine Ratio: 26 — ABNORMAL HIGH (ref 9–20)
BUN: 34 mg/dL — ABNORMAL HIGH (ref 6–24)
CO2: 22 mmol/L (ref 20–29)
Calcium: 9.6 mg/dL (ref 8.7–10.2)
Chloride: 100 mmol/L (ref 96–106)
Creatinine, Ser: 1.29 mg/dL — ABNORMAL HIGH (ref 0.76–1.27)
Glucose: 99 mg/dL (ref 70–99)
Potassium: 4.7 mmol/L (ref 3.5–5.2)
Sodium: 139 mmol/L (ref 134–144)
eGFR: 65 mL/min/{1.73_m2} (ref >59)

## 2021-12-29 LAB — URIC ACID: Uric Acid: 6.6 mg/dL (ref 3.8–8.4)

## 2021-12-29 MED ORDER — ALLOPURINOL 100 MG PO TABS: 100.0000 mg | ORAL_TABLET | Freq: Every day | ORAL | 3 refills | Status: DC

## 2021-12-29 MED ORDER — HYDRALAZINE HCL 25 MG PO TABS: 12.5000 mg | ORAL_TABLET | Freq: Three times a day (TID) | ORAL | 3 refills | Status: DC

## 2021-12-29 NOTE — Telephone Encounter (Signed)
Spoke with patient. Scr increased, no changes other than addition of Ozempic. I have encouraged patient to increase water intake. Will recheck labs next Thursday. ?Uric acid increased, will increase allopurinol to 100mg  daily. ?Start hydralazine 12.5mg  TID. ?Follow up in office 6/1.  ?

## 2022-01-05 ENCOUNTER — Other Ambulatory Visit: Payer: 59

## 2022-01-05 DIAGNOSIS — I10 Essential (primary) hypertension: Secondary | ICD-10-CM

## 2022-01-05 LAB — BASIC METABOLIC PANEL
BUN/Creatinine Ratio: 20 (ref 9–20)
BUN: 21 mg/dL (ref 6–24)
CO2: 23 mmol/L (ref 20–29)
Calcium: 9.6 mg/dL (ref 8.7–10.2)
Chloride: 102 mmol/L (ref 96–106)
Creatinine, Ser: 1.05 mg/dL (ref 0.76–1.27)
Glucose: 84 mg/dL (ref 70–99)
Potassium: 4.8 mmol/L (ref 3.5–5.2)
Sodium: 137 mmol/L (ref 134–144)
eGFR: 83 mL/min/{1.73_m2} (ref >59)

## 2022-01-06 ENCOUNTER — Encounter: Payer: Self-pay | Admitting: Pharmacist

## 2022-01-06 ENCOUNTER — Other Ambulatory Visit: Payer: Self-pay | Admitting: Pharmacist

## 2022-01-06 MED ORDER — HYDRALAZINE HCL 25 MG PO TABS: 25.0000 mg | ORAL_TABLET | Freq: Three times a day (TID) | ORAL | 3 refills | Status: DC

## 2022-01-06 MED ORDER — ATORVASTATIN CALCIUM 80 MG PO TABS: 80.0000 mg | ORAL_TABLET | Freq: Every day | ORAL | 3 refills | Status: DC

## 2022-01-09 ENCOUNTER — Ambulatory Visit: Payer: 59 | Admitting: Surgery

## 2022-01-09 ENCOUNTER — Encounter: Payer: Self-pay | Admitting: Surgery

## 2022-01-09 ENCOUNTER — Other Ambulatory Visit: Payer: Self-pay

## 2022-01-09 ENCOUNTER — Ambulatory Visit (HOSPITAL_COMMUNITY)
Admission: RE | Admit: 2022-01-09 | Discharge: 2022-01-09 | Disposition: A | Discharge status: Home / Self Care | Payer: 59 | Source: Ambulatory Visit | Attending: Surgery | Admitting: Surgery

## 2022-01-09 VITALS — BP 129/82 | HR 60 | Temp 97.6°F | Resp 20 | Ht 72.0 in | Wt 292.0 lb

## 2022-01-09 DIAGNOSIS — I6523 Occlusion and stenosis of bilateral carotid arteries: Secondary | ICD-10-CM

## 2022-01-09 DIAGNOSIS — I6529 Occlusion and stenosis of unspecified carotid artery: Secondary | ICD-10-CM | POA: Diagnosis present
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
## 2022-01-09 NOTE — Progress Notes (Signed)
Vascular and Vein Specialist of [facility]  Patient name: Timothy Golden MRN: [id number] DOB: 12/16/1965 Sex: male   REQUESTING PROVIDER:    Dr. Irish Lack   REASON FOR CONSULT:    Carotid stenosis  HISTORY OF PRESENT ILLNESS:   Timothy Golden is a 56 y.o. male, who I was consulted on in April 2022, when the patient presented with chest pain and was diagnosed with a NSTEMI.  He was scheduled for cardiac surgery but his preoperative carotid duplex showed significant bilateral stenosis, left greater than right.  I sent him for CT scan to better evaluate his disease and this only indicated a 70% left-sided stenosis and 60% right.  Therefore, no carotid intervention was performed.  He went on to have CABG.  The patient has a history of stroke in the remote past.  However, this happened around the time of the trauma with a bungee cord injury to his neck.  His symptoms were mainly dizziness.  He is currently without symptoms of numbness or weakness in either extremity, slurred speech, or amaurosis fugax.  The patient is a diabetic.  He takes a statin for hypercholesterolemia.  He is medically managed for hypertension.  He is a non-smoker  PAST MEDICAL HISTORY    Past Medical History:  Diagnosis Date   Diabetes mellitus without complication (HCC)    HLD (hyperlipidemia)    Hypertension    Obesity    Stroke ([facility])      FAMILY HISTORY   Family History  Problem Relation Age of Onset   Hypertension Father    Diabetes Mellitus I Father     SOCIAL HISTORY:   Social History   Socioeconomic History   Marital status: Married    Spouse name: Not on file   Number of children: Not on file   Years of education: Not on file   Highest education level: Not on file  Occupational History   Not on file  Tobacco Use   Smoking status: Never   Smokeless tobacco: Never  Substance and Sexual Activity   Alcohol use: Never   Drug use: Never   Sexual activity:  Not on file  Other Topics Concern   Not on file  Social History Narrative   Not on file   Social Determinants of Health   Financial Resource Strain: Not on file  Food Insecurity: Not on file  Transportation Needs: Not on file  Physical Activity: Not on file  Stress: Not on file  Social Connections: Not on file  Intimate Partner Violence: Not on file    ALLERGIES:    Allergies  Allergen Reactions   Ace Inhibitors     Other reaction(s): Cough (ALLERGY/intolerance)   Shellfish Allergy Rash    hives    CURRENT MEDICATIONS:    Current Outpatient Medications  Medication Sig Dispense Refill   acetaminophen (TYLENOL) 325 MG tablet Take 2 tablets (650 mg total) by mouth every 6 (six) hours as needed for mild pain.     allopurinol (ZYLOPRIM) 100 MG tablet Take 1 tablet (100 mg total) by mouth daily. 90 tablet 3   amLODipine (NORVASC) 10 MG tablet Take 1 tablet (10 mg total) by mouth daily. 90 tablet 3   aspirin 81 MG EC tablet Take 81 mg by mouth daily.     atorvastatin (LIPITOR) 80 MG tablet Take 1 tablet (80 mg total) by mouth daily. 90 tablet 3   carvedilol (COREG) 25 MG tablet Take 1 tablet (25 mg total) by mouth 2 (two)  times daily. 180 tablet 3   chlorthalidone (HYGROTON) 25 MG tablet Take 1 tablet (25 mg total) by mouth daily. 90 tablet 3   clopidogrel (PLAVIX) 75 MG tablet Take 1 tablet (75 mg total) by mouth daily. 90 tablet 3   ezetimibe (ZETIA) 10 MG tablet Take 1 tablet (10 mg total) by mouth daily. 90 tablet 3   HUMALOG 100 UNIT/ML injection Inject into the skin 3 (three) times daily with meals. Sliding Scale     hydrALAZINE (APRESOLINE) 25 MG tablet Take 1 tablet (25 mg total) by mouth 3 (three) times daily. 270 tablet 3   irbesartan (AVAPRO) 300 MG tablet Take 1 tablet (300 mg total) by mouth daily. 90 tablet 3   metFORMIN (GLUCOPHAGE-XR) 500 MG 24 hr tablet Take 500 mg by mouth 2 (two) times daily.     Semaglutide,0.25 or 0.5MG /DOS, (OZEMPIC, 0.25 OR 0.5 MG/DOSE,)  2 MG/1.5ML SOPN Inject 0.25 mg into the skin once a week. 3 mL 0   spironolactone (ALDACTONE) 25 MG tablet Take 0.5 tablets (12.5 mg total) by mouth daily. 45 tablet 3   TOUJEO MAX SOLOSTAR 300 UNIT/ML Solostar Pen Inject 35-40 Units into the skin daily.     No current facility-administered medications for this visit.    REVIEW OF SYSTEMS:   [X]  denotes positive finding, [ ]  denotes negative finding Cardiac  Comments:  Chest pain or chest pressure:    Shortness of breath upon exertion:    Short of breath when lying flat:    Irregular heart rhythm:        Vascular    Pain in calf, thigh, or hip brought on by ambulation:    Pain in feet at night that wakes you up from your sleep:     Blood clot in your veins:    Leg swelling:         Pulmonary    Oxygen at home:    Productive cough:     Wheezing:         Neurologic    Sudden weakness in arms or legs:     Sudden numbness in arms or legs:     Sudden onset of difficulty speaking or slurred speech:    Temporary loss of vision in one eye:     Problems with dizziness:         Gastrointestinal    Blood in stool:      Vomited blood:         Genitourinary    Burning when urinating:     Blood in urine:        Psychiatric    Major depression:         Hematologic    Bleeding problems:    Problems with blood clotting too easily:        Skin    Rashes or ulcers:        Constitutional    Fever or chills:     PHYSICAL EXAM:   There were no vitals filed for this visit.  GENERAL: The patient is a well-nourished male, in no acute distress. The vital signs are documented above. CARDIAC: There is a regular rate and rhythm.  VASCULAR: I evaluated his carotid bifurcation with the SonoSite.  It is easily accessible.  He does have good mobility of his neck PULMONARY: Nonlabored respirations MUSCULOSKELETAL: There are no major deformities or cyanosis. NEUROLOGIC: No focal weakness or paresthesias are detected. SKIN: There are no  ulcers or rashes noted. PSYCHIATRIC: The  patient has a normal affect.  STUDIES:   I have reviewed the following: 12/18/2020 -  CTA neck 1. 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 2. 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 Additional severe near occlusive stenosis at the origin of the left external carotid artery. 3. 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 Dominant right vertebral artery occludes just distal to its origin, with irregular distal reconstitution at the proximal V2 segment. Additional moderate to severe multifocal stenoses throughout the right V2 through V4 segments. 4. 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 PCAs not visualized on this exam.   Carotid duplex:  Greater than 80% left carotid stenosis, 40-59% right-sided stenosis  ASSESSMENT and PLAN   Asymptomatic bilateral carotid stenosis, left greater than right: I discussed 2 options for treatment, carotid endarterectomy versus stenting.  For multiple reasons, I think that he is a good candidate for endarterectomy.  His bifurcation is in the mid neck and appears to be easily accessible.  He has good mobility of his neck.  I discussed proceeding with left carotid endarterectomy.  We discussed the risks and benefits of the operation including the risk of stroke, nerve injury, bleeding.  All of his questions were answered.  He is going to see his daughter in [place] June 9, and we will schedule it when he gets back.  He will need to be off of his Plavix.   Leia Alf, MD, FACS [facility] Specialists of [facility] [phone] Pager [phone]

## 2022-01-13 ENCOUNTER — Encounter: Payer: Self-pay | Admitting: Surgery

## 2022-01-19 ENCOUNTER — Encounter: Payer: Self-pay | Admitting: Pharmacist

## 2022-01-19 ENCOUNTER — Ambulatory Visit: Payer: 59

## 2022-01-19 NOTE — Progress Notes (Deleted)
Patient ID: Timothy Golden                 DOB: 1965-10-19                      MRN: [id number]     HPI: Timothy Golden is a 56 y.o. male referred by Dr. Eldridge Dace to HTN clinic. PMH is significant for HTN, HLD, DM, obesity, multivessel CAD s/p CABG and stroke. Patient underwent 4-vessel CABG on 12/20/20. His losartan dose was decreased to 25mg  daily post op. At follow up visit with , his losartan was increased to 50mg  daily and later 100mg  daily. He sent a my chart message 6/12 that his BP was still high. Amlodipine 5mg  daily was added and he was referred to the HTN clinic.  At initial HTN clinic apt, amlodipine was increased to 10mg  daily and discussed diet improvements. At next visit, chlorthalidone 12.5mg  daily was started and then increased to 25mg  daily. He was seen by Dr. on 8/25 where his metoprolol was changed to carvedilol 6.25mg  twice a day. Patient was lost to follow up briefly.   He messaged the HTN clinic about his high blood pressure. Losartan was changed to irbesartan 300mg  daily. He was seen in clinic in Dec 2022 and carvedilol was increased to 12.5mg  twice a day. At next visit in Feb 2023, spironolactone 12.5 mg daily was started and allopurinol 50 mg started for uric acid > 5.5. Three weeks later, BP was still above goal >130/80 and carvedilol was increased to 25 mg BID.  At last visit on 01/06/22 hydralazine 25mg  three times a day was started. Pt scr bumped, therefore spironolactone could not be increased. Improved with hydration.  Patient presents today to clinic. He was recently started on ozempic. Seeing better blood sugar control. A little nausea and dizziness. Walking about a mile 6 days a week. Compliant with medications. Blood pressure in the high 140's systolic. HR typically around 65. Compliant with his medications.  Current HTN meds:  Amlodipine 10 mg daily Irbesartan 300 mg daily Carvedilol 25 mg twice a day Chlorthalidone 25 mg daily Spironolactone 12.5  mg daily  Previously tried: atenolol, metoprolol, HCTZ, losartan BP goal: <130/80  Family History: Father - DM, HTN   Social History: never smoked, drinks socially  Diet: eating less with ozempic Salad for lunch 4 days a week Burritos, tacos -made at home Broccoli, green beans, peas, kale Breakfast: english muffin w/ PB and butter Drink: diet coke, diet snapple, Gatorade zero, some water  Exercise: walking 6 times a week  Home BP readings:  High 140's/79  Wt Readings from Last 3 Encounters:  01/09/22 292 lb (132.5 kg)  10/18/21 295 lb (133.8 kg)  04/14/21 284 lb 12.8 oz (129.2 kg)   BP Readings from Last 3 Encounters:  01/09/22 129/82  12/28/21 (!) 144/60  10/18/21 (!) 142/82   Pulse Readings from Last 3 Encounters:  01/09/22 60  12/28/21 65  10/18/21 76    Renal function: Estimated Creatinine Clearance: 110.7 mL/min (by C-G formula based on SCr of 1.05 mg/dL).  Past Medical History:  Diagnosis Date   Diabetes mellitus without complication (HCC)    HLD (hyperlipidemia)    Hypertension    Obesity    Stroke [facility])     Current Outpatient Medications on File Prior to Visit  Medication Sig Dispense Refill   acetaminophen (TYLENOL) 325 MG tablet Take 2 tablets (650 mg total) by mouth every 6 (six) hours  as needed for mild pain.     allopurinol (ZYLOPRIM) 100 MG tablet Take 1 tablet (100 mg total) by mouth daily. 90 tablet 3   amLODipine (NORVASC) 10 MG tablet Take 1 tablet (10 mg total) by mouth daily. 90 tablet 3   aspirin 81 MG EC tablet Take 81 mg by mouth daily.     atorvastatin (LIPITOR) 80 MG tablet Take 1 tablet (80 mg total) by mouth daily. 90 tablet 3   carvedilol (COREG) 25 MG tablet Take 1 tablet (25 mg total) by mouth 2 (two) times daily. 180 tablet 3   chlorthalidone (HYGROTON) 25 MG tablet Take 1 tablet (25 mg total) by mouth daily. 90 tablet 3   clopidogrel (PLAVIX) 75 MG tablet Take 1 tablet (75 mg total) by mouth daily. 90 tablet 3    ezetimibe (ZETIA) 10 MG tablet Take 1 tablet (10 mg total) by mouth daily. 90 tablet 3   HUMALOG 100 UNIT/ML injection Inject into the skin 3 (three) times daily with meals. Sliding Scale     hydrALAZINE (APRESOLINE) 25 MG tablet Take 1 tablet (25 mg total) by mouth 3 (three) times daily. 270 tablet 3   irbesartan (AVAPRO) 300 MG tablet Take 1 tablet (300 mg total) by mouth daily. 90 tablet 3   metFORMIN (GLUCOPHAGE-XR) 500 MG 24 hr tablet Take 500 mg by mouth 2 (two) times daily.     Semaglutide,0.25 or 0.5MG /DOS, (OZEMPIC, 0.25 OR 0.5 MG/DOSE,) 2 MG/1.5ML SOPN Inject 0.25 mg into the skin once a week. 3 mL 0   spironolactone (ALDACTONE) 25 MG tablet Take 0.5 tablets (12.5 mg total) by mouth daily. 45 tablet 3   TOUJEO MAX SOLOSTAR 300 UNIT/ML Solostar Pen Inject 35-40 Units into the skin daily.     No current facility-administered medications on file prior to visit.    Allergies  Allergen Reactions   Ace Inhibitors     Other reaction(s): Cough (ALLERGY/intolerance)   Shellfish Allergy Rash    hives    There were no vitals taken for this visit.   Assessment/Plan:  1. Hypertension - Blood pressure is above goal of <130/80 in clinic after switching to irbesartan, increasing carvedilol to 25 mg BID and adding low-dose spironolactone. Will check BMP today and long uric acid level to see if allopurinol needs to be increased to 100mg . If uric acid is sufficiently low then we will just discontinue allopurinol since it has not helped BP yet. If K is ok, will plan to increase spironolactone. Follow up in clinic 6/1. I will call patient tomorrow with lab results to review. We also discussed adding in a few days of resistance training.   2. Hyperlipidemia -  - Risk factors of premature CAD and DM and currently above LDL goal of < 55 mg/dL (97 on 8/1) on atorvastatin 80 mg daily. Started on ezetimibe 10 mg daily in April 2023 and due for follow-up lipid panel 01/23/2022.    Thank you  03/25/2022, PharmD PGY1 Pharmacy Resident [facility]  [address], [place], [place]  Phone: [phone]; Fax: [phone]

## 2022-01-23 ENCOUNTER — Other Ambulatory Visit: Payer: 59

## 2022-01-25 ENCOUNTER — Encounter (HOSPITAL_COMMUNITY): Payer: Self-pay

## 2022-01-25 ENCOUNTER — Encounter (HOSPITAL_COMMUNITY)
Admission: RE | Admit: 2022-01-25 | Discharge: 2022-01-25 | Disposition: A | Discharge status: Home / Self Care | Payer: 59 | Source: Ambulatory Visit | Attending: Surgery | Admitting: Surgery

## 2022-01-25 ENCOUNTER — Other Ambulatory Visit: Payer: Self-pay

## 2022-01-25 VITALS — BP 116/62 | HR 68 | Temp 98.2°F | Resp 18 | Ht 72.0 in | Wt 288.1 lb

## 2022-01-25 DIAGNOSIS — I6523 Occlusion and stenosis of bilateral carotid arteries: Secondary | ICD-10-CM | POA: Insufficient documentation

## 2022-01-25 DIAGNOSIS — Z01818 Encounter for other preprocedural examination: Secondary | ICD-10-CM

## 2022-01-25 DIAGNOSIS — Z01812 Encounter for preprocedural laboratory examination: Secondary | ICD-10-CM | POA: Insufficient documentation

## 2022-01-25 HISTORY — DX: Angina pectoris, unspecified: I20.9

## 2022-01-25 HISTORY — DX: Atherosclerotic heart disease of native coronary artery without angina pectoris: I25.10

## 2022-01-25 HISTORY — DX: Unspecified osteoarthritis, unspecified site: M19.90

## 2022-01-25 HISTORY — DX: Acute myocardial infarction, unspecified: I21.9

## 2022-01-25 LAB — COMPREHENSIVE METABOLIC PANEL
ALT: 33 U/L (ref 0–44)
AST: 26 U/L (ref 15–41)
Albumin: 4.4 g/dL (ref 3.5–5.0)
Alkaline Phosphatase: 73 U/L (ref 38–126)
Anion gap: 11 (ref 5–15)
BUN: 40 mg/dL — ABNORMAL HIGH (ref 6–20)
CO2: 21 mmol/L — ABNORMAL LOW (ref 22–32)
Calcium: 9.8 mg/dL (ref 8.9–10.3)
Chloride: 106 mmol/L (ref 98–111)
Creatinine, Ser: 1.96 mg/dL — ABNORMAL HIGH (ref 0.61–1.24)
GFR, Estimated: 39 mL/min — ABNORMAL LOW (ref >60)
Glucose, Bld: 130 mg/dL — ABNORMAL HIGH (ref 70–99)
Potassium: 4.8 mmol/L (ref 3.5–5.1)
Sodium: 138 mmol/L (ref 135–145)
Total Bilirubin: 0.8 mg/dL (ref 0.3–1.2)
Total Protein: 7.3 g/dL (ref 6.5–8.1)

## 2022-01-25 LAB — CBC
HCT: 34.8 % — ABNORMAL LOW (ref 39.0–52.0)
Hemoglobin: 11.9 g/dL — ABNORMAL LOW (ref 13.0–17.0)
MCH: 29.5 pg (ref 26.0–34.0)
MCHC: 34.2 g/dL (ref 30.0–36.0)
MCV: 86.1 fL (ref 80.0–100.0)
Platelets: 377 10*3/uL (ref 150–400)
RBC: 4.04 MIL/uL — ABNORMAL LOW (ref 4.22–5.81)
RDW: 13.1 % (ref 11.5–15.5)
WBC: 13.1 10*3/uL — ABNORMAL HIGH (ref 4.0–10.5)
nRBC: 0 % (ref 0.0–0.2)

## 2022-01-25 LAB — PROTIME-INR
INR: 1.1 (ref 0.8–1.2)
Prothrombin Time: 13.8 seconds (ref 11.4–15.2)

## 2022-01-25 LAB — GLUCOSE, CAPILLARY: Glucose-Capillary: 143 mg/dL — ABNORMAL HIGH (ref 70–99)

## 2022-01-25 LAB — SURGICAL PCR SCREEN

## 2022-01-25 LAB — TYPE AND SCREEN
ABO/RH(D): A POS
Antibody Screen: NEGATIVE

## 2022-01-25 LAB — APTT: aPTT: 29 seconds (ref 24–36)

## 2022-01-25 NOTE — Progress Notes (Signed)
Unable to obtain urine sample during PAT appt. Order placed for DOS and specimen cup placed in chart.

## 2022-01-25 NOTE — Progress Notes (Addendum)
Surgical Instructions    Your procedure is scheduled on Wednesday, 02/01/22.  Report to [facility] Main Entrance "A" at 6:30 A.M., then check in with the Admitting office.  Call this number if you have problems the morning of surgery:  [phone]   If you have any questions prior to your surgery date call [phone]: Open Monday-Friday 8am-4pm    Remember:  Do not eat or drink after midnight the night before your surgery     Take these medicines the morning of surgery with A SIP OF WATER:  allopurinol (ZYLOPRIM) amLODipine (NORVASC) atorvastatin (LIPITOR)  carvedilol (COREG) ezetimibe (ZETIA) hydrALAZINE (APRESOLINE)  Aspirin  IF NEEDED: acetaminophen (TYLENOL)  As of today, STOP taking any (unless otherwise instructed by your surgeon) Aleve, Naproxen, Ibuprofen, Motrin, Advil, Goody's, BC's, all herbal medications, fish oil, and all vitamins.  YOU WILL NEED TO HOLD YOUR PLAVIX FOR 5 DAYS BEFORE YOUR PROCEDURE. TAKE YOUR LAST DOSE ON 01/26/22.  CONTINUE TAKING ASPIRIN.      WHAT DO I DO ABOUT MY DIABETES MEDICATION?   Do not take oral diabetes medicines (pills) the morning of surgery.   THE MORNING OF SURGERY, do not metFORMIN (GLUCOPHAGE-XR) or Semaglutide. Only take 80% (28 units ) of TOUJEO.  The day of surgery, do not take other diabetes injectables, including Byetta (exenatide), Bydureon (exenatide ER), Victoza (liraglutide), or Trulicity (dulaglutide).  If your CBG is greater than 220 mg/dL, you may take  of your sliding scale (HUMALOG) dose of insulin.   HOW TO MANAGE YOUR DIABETES BEFORE AND AFTER SURGERY  Why is it important to control my blood sugar before and after surgery? Improving blood sugar levels before and after surgery helps healing and can limit problems. A way of improving blood sugar control is eating a healthy diet by:  Eating less sugar and carbohydrates  Increasing activity/exercise  Talking with your doctor about reaching your blood sugar  goals High blood sugars (greater than 180 mg/dL) can raise your risk of infections and slow your recovery, so you will need to focus on controlling your diabetes during the weeks before surgery. Make sure that the doctor who takes care of your diabetes knows about your planned surgery including the date and location.  How do I manage my blood sugar before surgery? Check your blood sugar at least 4 times a day, starting 2 days before surgery, to make sure that the level is not too high or low.  Check your blood sugar the morning of your surgery when you wake up and every 2 hours until you get to the Short Stay unit.  If your blood sugar is less than 70 mg/dL, you will need to treat for low blood sugar: Do not take insulin. Treat a low blood sugar (less than 70 mg/dL) with  cup of clear juice (cranberry or apple), 4 glucose tablets, OR glucose gel. Recheck blood sugar in 15 minutes after treatment (to make sure it is greater than 70 mg/dL). If your blood sugar is not greater than 70 mg/dL on recheck, call [phone] for further instructions. Report your blood sugar to the short stay nurse when you get to Short Stay.  If you are admitted to the hospital after surgery: Your blood sugar will be checked by the staff and you will probably be given insulin after surgery (instead of oral diabetes medicines) to make sure you have good blood sugar levels. The goal for blood sugar control after surgery is 80-180 mg/dL.  Do not wear jewelry or makeup Do not wear lotions, powders, perfumes/colognes, or deodorant. Men may shave face and neck. Do not bring valuables to the hospital. Do not wear nail polish, gel polish, artificial nails, or any other type of covering on natural nails (fingers and toes) If you have artificial nails or gel coating that need to be removed by a nail salon, please have this removed prior to surgery. Artificial nails or gel coating may interfere with anesthesia's ability to  adequately monitor your vital signs.  [facility] is not responsible for any belongings or valuables. .   Do NOT Smoke (Tobacco/Vaping)  24 hours prior to your procedure  If you use a CPAP at night, you may bring your mask for your overnight stay.   Contacts, glasses, hearing aids, dentures or partials may not be worn into surgery, please bring cases for these belongings   For patients admitted to the hospital, discharge time will be determined by your treatment team.   Patients discharged the day of surgery will not be allowed to drive home, and someone needs to stay with them for 24 hours.   SURGICAL WAITING ROOM VISITATION Patients having surgery or a procedure in a hospital may have two support people. Children under the age of 74 must have an adult with them who is not the patient. They may stay in the waiting area during the procedure and may switch out with other visitors. If the patient needs to stay at the hospital during part of their recovery, the visitor guidelines for inpatient rooms apply.  Please refer to the [facility] website for the visitor guidelines for Inpatients (after your surgery is over and you are in a regular room).       Special instructions:    Oral Hygiene is also important to reduce your risk of infection.  Remember - BRUSH YOUR TEETH THE MORNING OF SURGERY WITH YOUR REGULAR TOOTHPASTE   [facility]- Preparing For Surgery  Before surgery, you can play an important role. Because skin is not sterile, your skin needs to be as free of germs as possible. You can reduce the number of germs on your skin by washing with CHG (chlorahexidine gluconate) Soap before surgery.  CHG is an antiseptic cleaner which kills germs and bonds with the skin to continue killing germs even after washing.     Please do not use if you have an allergy to CHG or antibacterial soaps. If your skin becomes reddened/irritated stop using the CHG.  Do not shave (including legs and  underarms) for at least 48 hours prior to first CHG shower. It is OK to shave your face.  Please follow these instructions carefully.     Shower the NIGHT BEFORE SURGERY and the MORNING OF SURGERY with CHG Soap.   If you chose to wash your hair, wash your hair first as usual with your normal shampoo. After you shampoo, rinse your hair and body thoroughly to remove the shampoo.  Then [employer] and genitals (private parts) with your normal soap and rinse thoroughly to remove soap.  After that Use CHG Soap as you would any other liquid soap. You can apply CHG directly to the skin and wash gently with a scrungie or a clean washcloth.   Apply the CHG Soap to your body ONLY FROM THE NECK DOWN.  Do not use on open wounds or open sores. Avoid contact with your eyes, ears, mouth and genitals (private parts). Wash Face and genitals (private parts)  with your normal soap.   Wash thoroughly, paying special attention to the area where your surgery will be performed.  Thoroughly rinse your body with warm water from the neck down.  DO NOT shower/wash with your normal soap after using and rinsing off the CHG Soap.  Pat yourself dry with a CLEAN TOWEL.  Wear CLEAN PAJAMAS to bed the night before surgery  Place CLEAN SHEETS on your bed the night before your surgery  DO NOT SLEEP WITH PETS.   Day of Surgery: Take a shower with CHG soap. Wear Clean/Comfortable clothing the morning of surgery Do not apply any deodorants/lotions.   Remember to brush your teeth WITH YOUR REGULAR TOOTHPASTE.    If you received a COVID test during your pre-op visit, it is requested that you wear a mask when out in public, stay away from anyone that may not be feeling well, and notify your surgeon if you develop symptoms. If you have been in contact with anyone that has tested positive in the last 10 days, please notify your surgeon.    Please read over the following fact sheets that you were given.

## 2022-01-25 NOTE — Progress Notes (Addendum)
PCP - Charna Archer, NP  Cardiologist - Dr. Eldridge Dace  PPM/ICD - n/a Device Orders - n/a  Rep Notified - n/a  Chest x-ray - n/a EKG - 2-28/2023 Stress Test - denies ECHO - 12/17/20 Cardiac Cath - 12/16/20  Sleep Study - denies CPAP - denies  CBG - 143 Checks Blood Sugar __7__ times a day  Hold Plavix for 5 days before procedure.Take last dose on 01/26/22.   Continue taking Aspirin until procedure.  ERAS Protcol - NPO  COVID TEST- n/a   Anesthesia review: Yes. MI in 2022 with extensive cardiac history  Patient denies shortness of breath, fever, cough and chest pain at PAT appointment   All instructions explained to the patient, with a verbal understanding of the material. Patient agrees to go over the instructions while at home for a better understanding. Patient also instructed to self quarantine after being tested for COVID-19. The opportunity to ask questions was provided.

## 2022-01-26 NOTE — Progress Notes (Signed)
Anesthesia Chart Review:  Follows with cardiology for hx of CAD s/p NSTEMI  12/16/20 and CABG x 4 on 12/20/20. Preop studies showed EF 60-65% and bilateral ICA stenosis (60-70%). Vascular surgery recommended f/u doppler 90mo s/p CABG. Pt last seen by cardiologist Dr. Eldridge Dace 10/18/21 and noted to be stable from cardiac standpoint.  Referred patient back to vascular surgery to follow-up on carotid disease as he did not have recommended Doppler.  Follow-up duplex subsequently showed greater than 80% left carotid stenosis and Dr. Myra Gianotti recommended carotid endarterectomy.  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 Most recent A1c 6.9 on 12/07/21.   Preop labs reviewed, creatinine significantly elevated 1.96 (up from 1.05 on 01/05/2022), hemoglobin mildly low at 11.9.  Remainder of labs unremarkable.  The results were called to Dr. Estanislado Spire office.  EKG 10/18/21: NSR. Rate 70. Septal Q waves.  Carotid duplex 01/09/2022: Summary:  Right Carotid: Velocities in the right ICA are consistent with a 40-59% stenosis (top end of range).  Left Carotid: Velocities in the left ICA are consistent with an 80-99% stenosis. The ECA appears >50% stenosed.  Vertebrals:  Left vertebral artery demonstrates antegrade flow. Right vertebral artery was not visualized.  Subclavians: Normal flow hemodynamics were seen in the left subclavian artery. Right monophasic.   TTE (pre-CABG) 12/17/2020:  1. Left ventricular ejection fraction, by estimation, is 60 to 65%. The  left ventricle has normal function. The left ventricle has no regional  wall motion abnormalities. Left ventricular diastolic parameters are  consistent with Grade II diastolic  dysfunction (pseudonormalization). Elevated left ventricular end-diastolic  pressure.   2. Right ventricular systolic function is normal. The right ventricular  size is normal. There is normal pulmonary artery systolic pressure.    3. Left atrial size was moderately dilated.   4. The mitral valve is normal in structure. Trivial mitral valve  regurgitation. No evidence of mitral stenosis.   5. The aortic valve is tricuspid. Aortic valve regurgitation is not  visualized. No aortic stenosis is present.   6. Aortic dilatation noted. 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   7. The inferior vena cava is normal in size with greater than 50%  respiratory variability, suggesting right atrial pressure of 3 mmHg.

## 2022-01-26 NOTE — Progress Notes (Signed)
Patient will need a repeat surgical PCR on DOS due to an invalid result.

## 2022-01-27 NOTE — Telephone Encounter (Signed)
Sent to [place] to follow up.

## 2022-01-30 NOTE — Anesthesia Preprocedure Evaluation (Signed)
Anesthesia Evaluation  Patient identified by MRN, date of birth, ID band Patient awake    Reviewed: Allergy & Precautions, NPO status , Patient's Chart, lab work & pertinent test results  Airway Mallampati: III  TM Distance: >3 FB Neck ROM: Full    Dental no notable dental hx. (+) Teeth Intact, Dental Advisory Given   Pulmonary neg pulmonary ROS,    Pulmonary exam normal breath sounds clear to auscultation       Cardiovascular hypertension (141/60 in preop, has been around 130s-140s after CABG), Pt. on medications + CAD, + Past MI, + CABG (12/2020 cabg x 4) and +CHF (grade 2 diastolic dysfunction on TEE pre-CABG, normal LVEF. no post-cabg tee accessible)  Normal cardiovascular exam Rhythm:Regular Rate:Normal  Follows with cardiology for hx of CAD s/p NSTEMI  12/16/20 and CABG x 4 on 12/20/20. Preop studies showed EF 60-65% and bilateral ICA stenosis (60-70%). Vascular surgery recommended f/u doppler 35mo s/p CABG. Pt last seen by cardiologist Dr. Eldridge Dace 10/18/21 and noted to be stable from cardiac standpoint  TTE (pre-CABG) 12/17/2020: 1. Left ventricular ejection fraction, by estimation, is 60 to 65%. The  left ventricle has normal function. The left ventricle has no regional  wall motion abnormalities. Left ventricular diastolic parameters are  consistent with Grade II diastolic  dysfunction (pseudonormalization). Elevated left ventricular end-diastolic  pressure.  2. Right ventricular systolic function is normal. The right ventricular  size is normal. There is normal pulmonary artery systolic pressure.  3. Left atrial size was moderately dilated.  4. The mitral valve is normal in structure. Trivial mitral valve  regurgitation. No evidence of mitral stenosis.  5. The aortic valve is tricuspid. Aortic valve regurgitation is not  visualized. No aortic stenosis is present.  6. Aortic dilatation noted. There is mild dilatation of  the ascending  aorta, measuring 37 mm.  7. The inferior vena cava is normal in size with greater than 50%  respiratory variability, suggesting right atrial pressure of 3 mmHg.    Neuro/Psych Carotid duplex 01/09/2022: Summary:  Right Carotid: Velocities in the right ICA are consistent with a 40-59% stenosis (top end of range).  Left Carotid: Velocities in the left ICA are consistent with an 80-99% stenosis. The ECA appears >50% stenosed.  Vertebrals: Left vertebral artery demonstrates antegrade flow. Right vertebral artery was not visualized.  Subclavians: Normal flow hemodynamics were seen in the left subclavian artery. Right monophasic.   CVA, No Residual Symptoms negative psych ROS   GI/Hepatic negative GI ROS, Neg liver ROS,   Endo/Other  diabetes, Well Controlled, Type 2, Oral Hypoglycemic Agents, Insulin DependentBMI 39 Last a1c 6.9 FS 182 this AM, was 60 this AM so had apple juice at 6am  Renal/GU Renal Insufficiency and CRFRenal diseaseCr 1.96 this June 2023 (up from 1.05 in may 2023)- to be repeated this AM   negative genitourinary   Musculoskeletal  (+) Arthritis , Osteoarthritis,    Abdominal (+) + obese,   Peds  Hematology negative hematology ROS (+) Hb 11.9   Anesthesia Other Findings   Reproductive/Obstetrics negative OB ROS                           Anesthesia Physical Anesthesia Plan  ASA: 3  Anesthesia Plan: General   Post-op Pain Management: Tylenol PO (pre-op)*   Induction: Intravenous  PONV Risk Score and Plan: 2 and Ondansetron, Dexamethasone, Midazolam and Treatment may vary due to age or medical condition  Airway Management  Planned: Oral ETT  Additional Equipment: Arterial line  Intra-op Plan:   Post-operative Plan: Extubation in OR  Informed Consent: I have reviewed the patients History and Physical, chart, labs and discussed the procedure including the risks, benefits and alternatives for the proposed  anesthesia with the patient or authorized representative who has indicated his/her understanding and acceptance.     Dental advisory given  Plan Discussed with: CRNA  Anesthesia Plan Comments: ( )       Anesthesia Quick Evaluation

## 2022-02-01 ENCOUNTER — Other Ambulatory Visit: Payer: Self-pay

## 2022-02-01 ENCOUNTER — Inpatient Hospital Stay (HOSPITAL_COMMUNITY)
Admission: RE | Admit: 2022-02-01 | Discharge: 2022-02-02 | DRG: 038 | Disposition: A | Discharge status: Home / Self Care | Payer: 59 | Attending: Surgery | Admitting: Surgery

## 2022-02-01 ENCOUNTER — Encounter (HOSPITAL_COMMUNITY)
Admission: RE | Disposition: A | Discharge status: Home / Self Care | Payer: Self-pay | Source: Home / Self Care | Attending: Surgery

## 2022-02-01 ENCOUNTER — Inpatient Hospital Stay (HOSPITAL_COMMUNITY): Payer: 59 | Admitting: Physician Assistant

## 2022-02-01 ENCOUNTER — Encounter (HOSPITAL_COMMUNITY): Payer: Self-pay | Admitting: Surgery

## 2022-02-01 DIAGNOSIS — Z951 Presence of aortocoronary bypass graft: Secondary | ICD-10-CM | POA: Diagnosis not present

## 2022-02-01 DIAGNOSIS — Z888 Allergy status to other drugs, medicaments and biological substances status: Secondary | ICD-10-CM | POA: Diagnosis not present

## 2022-02-01 DIAGNOSIS — I2510 Atherosclerotic heart disease of native coronary artery without angina pectoris: Secondary | ICD-10-CM | POA: Diagnosis present

## 2022-02-01 DIAGNOSIS — I509 Heart failure, unspecified: Secondary | ICD-10-CM

## 2022-02-01 DIAGNOSIS — I6529 Occlusion and stenosis of unspecified carotid artery: Secondary | ICD-10-CM | POA: Diagnosis present

## 2022-02-01 DIAGNOSIS — Z8249 Family history of ischemic heart disease and other diseases of the circulatory system: Secondary | ICD-10-CM

## 2022-02-01 DIAGNOSIS — M1990 Unspecified osteoarthritis, unspecified site: Secondary | ICD-10-CM | POA: Diagnosis present

## 2022-02-01 DIAGNOSIS — Z7982 Long term (current) use of aspirin: Secondary | ICD-10-CM

## 2022-02-01 DIAGNOSIS — E119 Type 2 diabetes mellitus without complications: Secondary | ICD-10-CM | POA: Diagnosis present

## 2022-02-01 DIAGNOSIS — Z6838 Body mass index (BMI) 38.0-38.9, adult: Secondary | ICD-10-CM

## 2022-02-01 DIAGNOSIS — Z7984 Long term (current) use of oral hypoglycemic drugs: Secondary | ICD-10-CM | POA: Diagnosis not present

## 2022-02-01 DIAGNOSIS — I6523 Occlusion and stenosis of bilateral carotid arteries: Secondary | ICD-10-CM | POA: Diagnosis present

## 2022-02-01 DIAGNOSIS — Z833 Family history of diabetes mellitus: Secondary | ICD-10-CM | POA: Diagnosis not present

## 2022-02-01 DIAGNOSIS — I63231 Cerebral infarction due to unspecified occlusion or stenosis of right carotid arteries: Secondary | ICD-10-CM | POA: Diagnosis not present

## 2022-02-01 DIAGNOSIS — Z79899 Other long term (current) drug therapy: Secondary | ICD-10-CM | POA: Diagnosis not present

## 2022-02-01 DIAGNOSIS — I5032 Chronic diastolic (congestive) heart failure: Secondary | ICD-10-CM | POA: Diagnosis present

## 2022-02-01 DIAGNOSIS — E669 Obesity, unspecified: Secondary | ICD-10-CM | POA: Diagnosis present

## 2022-02-01 DIAGNOSIS — Z7902 Long term (current) use of antithrombotics/antiplatelets: Secondary | ICD-10-CM | POA: Diagnosis not present

## 2022-02-01 DIAGNOSIS — E7800 Pure hypercholesterolemia, unspecified: Secondary | ICD-10-CM | POA: Diagnosis present

## 2022-02-01 DIAGNOSIS — Z91013 Allergy to seafood: Secondary | ICD-10-CM | POA: Diagnosis not present

## 2022-02-01 DIAGNOSIS — I252 Old myocardial infarction: Secondary | ICD-10-CM

## 2022-02-01 DIAGNOSIS — I110 Hypertensive heart disease with heart failure: Secondary | ICD-10-CM

## 2022-02-01 DIAGNOSIS — Z794 Long term (current) use of insulin: Secondary | ICD-10-CM | POA: Diagnosis not present

## 2022-02-01 DIAGNOSIS — R7989 Other specified abnormal findings of blood chemistry: Secondary | ICD-10-CM

## 2022-02-01 DIAGNOSIS — Z87891 Personal history of nicotine dependence: Secondary | ICD-10-CM | POA: Diagnosis not present

## 2022-02-01 DIAGNOSIS — Z8673 Personal history of transient ischemic attack (TIA), and cerebral infarction without residual deficits: Secondary | ICD-10-CM

## 2022-02-01 DIAGNOSIS — I6522 Occlusion and stenosis of left carotid artery: Secondary | ICD-10-CM | POA: Diagnosis not present

## 2022-02-01 DIAGNOSIS — Z01818 Encounter for other preprocedural examination: Principal | ICD-10-CM

## 2022-02-01 HISTORY — PX: ENDARTERECTOMY: SHX5162

## 2022-02-01 HISTORY — PX: PATCH ANGIOPLASTY: SHX6230

## 2022-02-01 LAB — BASIC METABOLIC PANEL
Anion gap: 10 (ref 5–15)
BUN: 26 mg/dL — ABNORMAL HIGH (ref 6–20)
CO2: 22 mmol/L (ref 22–32)
Calcium: 9.3 mg/dL (ref 8.9–10.3)
Chloride: 101 mmol/L (ref 98–111)
Creatinine, Ser: 1.06 mg/dL (ref 0.61–1.24)
GFR, Estimated: >60 mL/min (ref >60)
Glucose, Bld: 187 mg/dL — ABNORMAL HIGH (ref 70–99)
Potassium: 4.8 mmol/L (ref 3.5–5.1)
Sodium: 133 mmol/L — ABNORMAL LOW (ref 135–145)

## 2022-02-01 LAB — POCT I-STAT 7, (LYTES, BLD GAS, ICA,H+H)
Acid-base deficit: 2 mmol/L (ref 0.0–2.0)
Bicarbonate: 24.1 mmol/L (ref 20.0–28.0)
Calcium, Ion: 1.45 mmol/L — ABNORMAL HIGH (ref 1.15–1.40)
HCT: 29 % — ABNORMAL LOW (ref 39.0–52.0)
Hemoglobin: 9.9 g/dL — ABNORMAL LOW (ref 13.0–17.0)
O2 Saturation: 96 %
Potassium: 4.4 mmol/L (ref 3.5–5.1)
Sodium: 136 mmol/L (ref 135–145)
TCO2: 26 mmol/L (ref 22–32)
pCO2 arterial: 46.9 mmHg (ref 32–48)
pH, Arterial: 7.319 — ABNORMAL LOW (ref 7.35–7.45)
pO2, Arterial: 90 mmHg (ref 83–108)

## 2022-02-01 LAB — URINALYSIS, ROUTINE W REFLEX MICROSCOPIC
Bilirubin Urine: NEGATIVE
Glucose, UA: NEGATIVE mg/dL
Hgb urine dipstick: NEGATIVE
Ketones, ur: NEGATIVE mg/dL
Leukocytes,Ua: NEGATIVE
Nitrite: NEGATIVE
Protein, ur: NEGATIVE mg/dL
Specific Gravity, Urine: 1.017 (ref 1.005–1.030)
pH: 6 (ref 5.0–8.0)

## 2022-02-01 LAB — POCT ACTIVATED CLOTTING TIME
Activated Clotting Time: 197 seconds
Activated Clotting Time: 215 seconds
Activated Clotting Time: 245 seconds

## 2022-02-01 LAB — GLUCOSE, CAPILLARY
Glucose-Capillary: 182 mg/dL — ABNORMAL HIGH (ref 70–99)
Glucose-Capillary: 176 mg/dL — ABNORMAL HIGH (ref 70–99)
Glucose-Capillary: 211 mg/dL — ABNORMAL HIGH (ref 70–99)
Glucose-Capillary: 165 mg/dL — ABNORMAL HIGH (ref 70–99)
Glucose-Capillary: 226 mg/dL — ABNORMAL HIGH (ref 70–99)

## 2022-02-01 LAB — SURGICAL PCR SCREEN
MRSA, PCR: NEGATIVE
Staphylococcus aureus: NEGATIVE

## 2022-02-01 SURGERY — ENDARTERECTOMY, CAROTID
Anesthesia: General | Site: Neck | Laterality: Right

## 2022-02-01 MED ORDER — PROPOFOL 10 MG/ML IV BOLUS
INTRAVENOUS | Status: AC
  Filled 2022-02-01: qty 20

## 2022-02-01 MED ORDER — ROCURONIUM BROMIDE 10 MG/ML (PF) SYRINGE
PREFILLED_SYRINGE | INTRAVENOUS | Status: DC | PRN
  Administered 2022-02-01: 10 mg via INTRAVENOUS
  Administered 2022-02-01: 20 mg via INTRAVENOUS
  Administered 2022-02-01: 100 mg via INTRAVENOUS
  Administered 2022-02-01: 30 mg via INTRAVENOUS

## 2022-02-01 MED ORDER — LACTATED RINGERS IV SOLN: INTRAVENOUS | Status: DC

## 2022-02-01 MED ORDER — PHENYLEPHRINE 80 MCG/ML (10ML) SYRINGE FOR IV PUSH (FOR BLOOD PRESSURE SUPPORT)
PREFILLED_SYRINGE | INTRAVENOUS | Status: DC | PRN
  Administered 2022-02-01: 240 ug via INTRAVENOUS
  Administered 2022-02-01 (×3): 160 ug via INTRAVENOUS
  Administered 2022-02-01 (×2): 80 ug via INTRAVENOUS
  Administered 2022-02-01: 160 ug via INTRAVENOUS
  Administered 2022-02-01 (×2): 240 ug via INTRAVENOUS

## 2022-02-01 MED ORDER — HYDROMORPHONE HCL 1 MG/ML IJ SOLN
INTRAMUSCULAR | Status: DC | PRN
  Administered 2022-02-01: .5 mg via INTRAVENOUS

## 2022-02-01 MED ORDER — ONDANSETRON HCL 4 MG/2ML IJ SOLN: 4.0000 mg | Freq: Four times a day (QID) | INTRAMUSCULAR | Status: DC | PRN

## 2022-02-01 MED ORDER — PHENYLEPHRINE HCL-NACL 20-0.9 MG/250ML-% IV SOLN
INTRAVENOUS | Status: DC | PRN
  Administered 2022-02-01: 40 ug/min via INTRAVENOUS

## 2022-02-01 MED ORDER — PHENOL 1.4 % MT LIQD: 1.0000 | OROMUCOSAL | Status: DC | PRN

## 2022-02-01 MED ORDER — ONDANSETRON HCL 4 MG/2ML IJ SOLN
INTRAMUSCULAR | Status: AC
  Filled 2022-02-01: qty 2

## 2022-02-01 MED ORDER — VASOPRESSIN 20 UNIT/ML IV SOLN
INTRAVENOUS | Status: DC | PRN
  Administered 2022-02-01 (×3): 1 [IU] via INTRAVENOUS
  Administered 2022-02-01: 2 [IU] via INTRAVENOUS
  Administered 2022-02-01 (×3): .5 [IU] via INTRAVENOUS
  Administered 2022-02-01 (×4): 1 [IU] via INTRAVENOUS
  Administered 2022-02-01: .5 [IU] via INTRAVENOUS
  Administered 2022-02-01 (×5): 1 [IU] via INTRAVENOUS
  Administered 2022-02-01 (×2): 2 [IU] via INTRAVENOUS

## 2022-02-01 MED ORDER — ALBUMIN HUMAN 5 % IV SOLN: INTRAVENOUS | Status: DC | PRN

## 2022-02-01 MED ORDER — SODIUM CHLORIDE 0.9 % IV SOLN: INTRAVENOUS | Status: DC

## 2022-02-01 MED ORDER — PROTAMINE SULFATE 10 MG/ML IV SOLN
INTRAVENOUS | Status: AC
  Filled 2022-02-01: qty 25

## 2022-02-01 MED ORDER — CHLORHEXIDINE GLUCONATE 0.12 % MT SOLN
15.0000 mL | Freq: Once | OROMUCOSAL | Status: AC
Start: 2022-02-01 — End: 2022-02-01
  Administered 2022-02-01: 15 mL via OROMUCOSAL
  Filled 2022-02-01: qty 15

## 2022-02-01 MED ORDER — VASOPRESSIN 20 UNIT/ML IV SOLN
INTRAVENOUS | Status: AC
Start: 2022-02-01 — End: ?
  Filled 2022-02-01: qty 1

## 2022-02-01 MED ORDER — INSULIN ASPART 100 UNIT/ML IJ SOLN: 0.0000 [IU] | INTRAMUSCULAR | Status: DC | PRN

## 2022-02-01 MED ORDER — EZETIMIBE 10 MG PO TABS
10.0000 mg | ORAL_TABLET | Freq: Every day | ORAL | Status: DC
  Administered 2022-02-02: 10 mg via ORAL
  Filled 2022-02-01: qty 1

## 2022-02-01 MED ORDER — ACETAMINOPHEN 500 MG PO TABS
1000.0000 mg | ORAL_TABLET | Freq: Once | ORAL | Status: AC
  Administered 2022-02-01: 1000 mg via ORAL
  Filled 2022-02-01: qty 2

## 2022-02-01 MED ORDER — HEPARIN 6000 UNIT IRRIGATION SOLUTION
Status: DC | PRN
  Administered 2022-02-01: 1

## 2022-02-01 MED ORDER — ALUM & MAG HYDROXIDE-SIMETH 200-200-20 MG/5ML PO SUSP: 15.0000 mL | ORAL | Status: DC | PRN

## 2022-02-01 MED ORDER — OXYCODONE HCL 5 MG/5ML PO SOLN: 5.0000 mg | Freq: Once | ORAL | Status: DC | PRN

## 2022-02-01 MED ORDER — AMLODIPINE BESYLATE 10 MG PO TABS
10.0000 mg | ORAL_TABLET | Freq: Every day | ORAL | Status: DC
  Administered 2022-02-02: 10 mg via ORAL
  Filled 2022-02-01: qty 1

## 2022-02-01 MED ORDER — CHLORTHALIDONE 25 MG PO TABS
25.0000 mg | ORAL_TABLET | Freq: Every day | ORAL | Status: DC
  Administered 2022-02-02: 25 mg via ORAL
  Filled 2022-02-01: qty 1

## 2022-02-01 MED ORDER — FENTANYL CITRATE (PF) 250 MCG/5ML IJ SOLN
INTRAMUSCULAR | Status: AC
  Filled 2022-02-01: qty 5

## 2022-02-01 MED ORDER — HYDROMORPHONE HCL 1 MG/ML IJ SOLN
INTRAMUSCULAR | Status: AC
  Filled 2022-02-01: qty 1

## 2022-02-01 MED ORDER — ALLOPURINOL 100 MG PO TABS
100.0000 mg | ORAL_TABLET | Freq: Every day | ORAL | Status: DC
  Administered 2022-02-02: 100 mg via ORAL
  Filled 2022-02-01: qty 1

## 2022-02-01 MED ORDER — SPIRONOLACTONE 12.5 MG HALF TABLET
12.5000 mg | ORAL_TABLET | Freq: Every day | ORAL | Status: DC
  Administered 2022-02-02: 12.5 mg via ORAL
  Filled 2022-02-01: qty 1

## 2022-02-01 MED ORDER — HYDROMORPHONE HCL 1 MG/ML IJ SOLN
0.2500 mg | INTRAMUSCULAR | Status: DC | PRN
  Administered 2022-02-01: 0.5 mg via INTRAVENOUS

## 2022-02-01 MED ORDER — LIDOCAINE 2% (20 MG/ML) 5 ML SYRINGE
INTRAMUSCULAR | Status: AC
  Filled 2022-02-01: qty 5

## 2022-02-01 MED ORDER — HEPARIN SODIUM (PORCINE) 1000 UNIT/ML IJ SOLN
INTRAMUSCULAR | Status: AC
  Filled 2022-02-01: qty 30

## 2022-02-01 MED ORDER — ATORVASTATIN CALCIUM 80 MG PO TABS
80.0000 mg | ORAL_TABLET | Freq: Every day | ORAL | Status: DC
  Administered 2022-02-02: 80 mg via ORAL
  Filled 2022-02-01: qty 1

## 2022-02-01 MED ORDER — PROTAMINE SULFATE 10 MG/ML IV SOLN
INTRAVENOUS | Status: DC | PRN
  Administered 2022-02-01: 10 mg via INTRAVENOUS
  Administered 2022-02-01 (×2): 20 mg via INTRAVENOUS

## 2022-02-01 MED ORDER — PANTOPRAZOLE SODIUM 40 MG PO TBEC
40.0000 mg | DELAYED_RELEASE_TABLET | Freq: Every day | ORAL | Status: DC
  Administered 2022-02-01 – 2022-02-02 (×2): 40 mg via ORAL
  Filled 2022-02-01 (×2): qty 1

## 2022-02-01 MED ORDER — HEPARIN SODIUM (PORCINE) 1000 UNIT/ML IJ SOLN
INTRAMUSCULAR | Status: DC | PRN
  Administered 2022-02-01: 11000 [IU] via INTRAVENOUS
  Administered 2022-02-01 (×2): 5000 [IU] via INTRAVENOUS

## 2022-02-01 MED ORDER — CALCIUM CHLORIDE 10 % IV SOLN
INTRAVENOUS | Status: DC | PRN
  Administered 2022-02-01: 100 mg via INTRAVENOUS
  Administered 2022-02-01: 150 mg via INTRAVENOUS
  Administered 2022-02-01: 750 mg via INTRAVENOUS

## 2022-02-01 MED ORDER — PROPOFOL 10 MG/ML IV BOLUS
INTRAVENOUS | Status: DC | PRN
  Administered 2022-02-01: 200 mg via INTRAVENOUS

## 2022-02-01 MED ORDER — GUAIFENESIN-DM 100-10 MG/5ML PO SYRP: 15.0000 mL | ORAL_SOLUTION | ORAL | Status: DC | PRN

## 2022-02-01 MED ORDER — AMISULPRIDE (ANTIEMETIC) 5 MG/2ML IV SOLN: 10.0000 mg | Freq: Once | INTRAVENOUS | Status: DC | PRN

## 2022-02-01 MED ORDER — SODIUM CHLORIDE 0.9 % IV SOLN: 500.0000 mL | Freq: Once | INTRAVENOUS | Status: DC | PRN

## 2022-02-01 MED ORDER — HYDRALAZINE HCL 20 MG/ML IJ SOLN: 5.0000 mg | INTRAMUSCULAR | Status: DC | PRN

## 2022-02-01 MED ORDER — EPHEDRINE 5 MG/ML INJ
INTRAVENOUS | Status: AC
  Filled 2022-02-01: qty 10

## 2022-02-01 MED ORDER — OXYCODONE-ACETAMINOPHEN 5-325 MG PO TABS: 1.0000 | ORAL_TABLET | ORAL | Status: DC | PRN

## 2022-02-01 MED ORDER — CHLORHEXIDINE GLUCONATE CLOTH 2 % EX PADS: 6.0000 | MEDICATED_PAD | Freq: Once | CUTANEOUS | Status: DC

## 2022-02-01 MED ORDER — ACETAMINOPHEN 650 MG RE SUPP: 325.0000 mg | RECTAL | Status: DC | PRN

## 2022-02-01 MED ORDER — ORAL CARE MOUTH RINSE: 15.0000 mL | Freq: Once | OROMUCOSAL | Status: AC

## 2022-02-01 MED ORDER — MIDAZOLAM HCL 2 MG/2ML IJ SOLN
INTRAMUSCULAR | Status: DC | PRN
  Administered 2022-02-01: 2 mg via INTRAVENOUS

## 2022-02-01 MED ORDER — ONDANSETRON HCL 4 MG/2ML IJ SOLN
INTRAMUSCULAR | Status: DC | PRN
  Administered 2022-02-01: 4 mg via INTRAVENOUS

## 2022-02-01 MED ORDER — DEXTROSE 50 % IV SOLN: 0.0000 mL | INTRAVENOUS | Status: DC | PRN

## 2022-02-01 MED ORDER — MAGNESIUM SULFATE 2 GM/50ML IV SOLN: 2.0000 g | Freq: Every day | INTRAVENOUS | Status: DC | PRN

## 2022-02-01 MED ORDER — EPHEDRINE SULFATE-NACL 50-0.9 MG/10ML-% IV SOSY
PREFILLED_SYRINGE | INTRAVENOUS | Status: DC | PRN
  Administered 2022-02-01 (×3): 10 mg via INTRAVENOUS
  Administered 2022-02-01 (×2): 15 mg via INTRAVENOUS

## 2022-02-01 MED ORDER — INSULIN ASPART 100 UNIT/ML IJ SOLN
0.0000 [IU] | Freq: Three times a day (TID) | INTRAMUSCULAR | Status: DC
  Administered 2022-02-01: 3 [IU] via SUBCUTANEOUS
  Administered 2022-02-02: 8 [IU] via SUBCUTANEOUS

## 2022-02-01 MED ORDER — DOCUSATE SODIUM 100 MG PO CAPS
100.0000 mg | ORAL_CAPSULE | Freq: Every day | ORAL | Status: DC
  Administered 2022-02-02: 100 mg via ORAL
  Filled 2022-02-01: qty 1

## 2022-02-01 MED ORDER — 0.9 % SODIUM CHLORIDE (POUR BTL) OPTIME
TOPICAL | Status: DC | PRN
  Administered 2022-02-01: 1000 mL

## 2022-02-01 MED ORDER — MORPHINE SULFATE (PF) 2 MG/ML IV SOLN: 2.0000 mg | INTRAVENOUS | Status: DC | PRN

## 2022-02-01 MED ORDER — CLOPIDOGREL BISULFATE 75 MG PO TABS
75.0000 mg | ORAL_TABLET | Freq: Every day | ORAL | Status: DC
  Administered 2022-02-02: 75 mg via ORAL
  Filled 2022-02-01: qty 1

## 2022-02-01 MED ORDER — CEFAZOLIN SODIUM-DEXTROSE 2-4 GM/100ML-% IV SOLN
2.0000 g | Freq: Three times a day (TID) | INTRAVENOUS | Status: AC
  Administered 2022-02-01 (×2): 2 g via INTRAVENOUS
  Filled 2022-02-01 (×2): qty 100

## 2022-02-01 MED ORDER — HEMOSTATIC AGENTS (NO CHARGE) OPTIME
TOPICAL | Status: DC | PRN
  Administered 2022-02-01: 1 via TOPICAL

## 2022-02-01 MED ORDER — INSULIN REGULAR(HUMAN) IN NACL 100-0.9 UT/100ML-% IV SOLN
INTRAVENOUS | Status: DC
  Administered 2022-02-01: 3 [IU]/h via INTRAVENOUS
  Filled 2022-02-01 (×2): qty 100

## 2022-02-01 MED ORDER — CALCIUM CHLORIDE 10 % IV SOLN
INTRAVENOUS | Status: AC
  Filled 2022-02-01: qty 10

## 2022-02-01 MED ORDER — CEFAZOLIN SODIUM-DEXTROSE 2-3 GM-%(50ML) IV SOLR: INTRAVENOUS | Status: DC | PRN

## 2022-02-01 MED ORDER — POTASSIUM CHLORIDE CRYS ER 20 MEQ PO TBCR: 20.0000 meq | EXTENDED_RELEASE_TABLET | Freq: Every day | ORAL | Status: DC | PRN

## 2022-02-01 MED ORDER — ROCURONIUM BROMIDE 10 MG/ML (PF) SYRINGE
PREFILLED_SYRINGE | INTRAVENOUS | Status: AC
  Filled 2022-02-01: qty 20

## 2022-02-01 MED ORDER — CARVEDILOL 25 MG PO TABS
25.0000 mg | ORAL_TABLET | Freq: Two times a day (BID) | ORAL | Status: DC
  Administered 2022-02-01 – 2022-02-02 (×2): 25 mg via ORAL
  Filled 2022-02-01 (×2): qty 1

## 2022-02-01 MED ORDER — METOPROLOL TARTRATE 5 MG/5ML IV SOLN: 2.0000 mg | INTRAVENOUS | Status: DC | PRN

## 2022-02-01 MED ORDER — LABETALOL HCL 5 MG/ML IV SOLN: 10.0000 mg | INTRAVENOUS | Status: DC | PRN

## 2022-02-01 MED ORDER — ONDANSETRON HCL 4 MG/2ML IJ SOLN: 4.0000 mg | Freq: Once | INTRAMUSCULAR | Status: DC | PRN

## 2022-02-01 MED ORDER — INSULIN ASPART 100 UNIT/ML IJ SOLN
INTRAMUSCULAR | Status: AC
  Administered 2022-02-01: 2 [IU] via SUBCUTANEOUS
  Filled 2022-02-01: qty 1

## 2022-02-01 MED ORDER — INSULIN ASPART 100 UNIT/ML IJ SOLN
5.0000 [IU] | Freq: Once | INTRAMUSCULAR | Status: AC
  Administered 2022-02-01: 5 [IU] via SUBCUTANEOUS

## 2022-02-01 MED ORDER — OXYCODONE HCL 5 MG PO TABS: 5.0000 mg | ORAL_TABLET | Freq: Once | ORAL | Status: DC | PRN

## 2022-02-01 MED ORDER — PHENYLEPHRINE 80 MCG/ML (10ML) SYRINGE FOR IV PUSH (FOR BLOOD PRESSURE SUPPORT)
PREFILLED_SYRINGE | INTRAVENOUS | Status: AC
  Filled 2022-02-01: qty 20

## 2022-02-01 MED ORDER — CEFAZOLIN IN SODIUM CHLORIDE 3-0.9 GM/100ML-% IV SOLN
3.0000 g | INTRAVENOUS | Status: AC
  Administered 2022-02-01: 3 g via INTRAVENOUS
  Filled 2022-02-01: qty 100

## 2022-02-01 MED ORDER — ACETAMINOPHEN 325 MG PO TABS: 325.0000 mg | ORAL_TABLET | ORAL | Status: DC | PRN

## 2022-02-01 MED ORDER — LIDOCAINE 2% (20 MG/ML) 5 ML SYRINGE
INTRAMUSCULAR | Status: DC | PRN
  Administered 2022-02-01: 60 mg via INTRAVENOUS

## 2022-02-01 MED ORDER — PHENYLEPHRINE 80 MCG/ML (10ML) SYRINGE FOR IV PUSH (FOR BLOOD PRESSURE SUPPORT)
PREFILLED_SYRINGE | INTRAVENOUS | Status: AC
  Filled 2022-02-01: qty 10

## 2022-02-01 MED ORDER — HYDRALAZINE HCL 25 MG PO TABS
25.0000 mg | ORAL_TABLET | Freq: Three times a day (TID) | ORAL | Status: DC
  Administered 2022-02-01 – 2022-02-02 (×3): 25 mg via ORAL
  Filled 2022-02-01 (×3): qty 1

## 2022-02-01 MED ORDER — ASPIRIN 81 MG PO TBEC
81.0000 mg | DELAYED_RELEASE_TABLET | Freq: Every day | ORAL | Status: DC
  Administered 2022-02-02: 81 mg via ORAL
  Filled 2022-02-01: qty 1

## 2022-02-01 MED ORDER — FENTANYL CITRATE (PF) 250 MCG/5ML IJ SOLN
INTRAMUSCULAR | Status: DC | PRN
Start: 2022-02-01 — End: 2022-02-01
  Administered 2022-02-01 (×2): 50 ug via INTRAVENOUS
  Administered 2022-02-01: 100 ug via INTRAVENOUS
  Administered 2022-02-01: 50 ug via INTRAVENOUS

## 2022-02-01 MED ORDER — HYDROMORPHONE HCL 1 MG/ML IJ SOLN
INTRAMUSCULAR | Status: AC
  Filled 2022-02-01: qty 0.5

## 2022-02-01 MED ORDER — MIDAZOLAM HCL 2 MG/2ML IJ SOLN
INTRAMUSCULAR | Status: AC
  Filled 2022-02-01: qty 2

## 2022-02-01 MED ORDER — SUGAMMADEX SODIUM 200 MG/2ML IV SOLN
INTRAVENOUS | Status: DC | PRN
  Administered 2022-02-01: 200 mg via INTRAVENOUS

## 2022-02-01 SURGICAL SUPPLY — 45 items
BAG COUNTER SPONGE SURGICOUNT (BAG) ×3 IMPLANT
CANISTER SUCT 3000ML PPV (MISCELLANEOUS) ×3 IMPLANT
CATH ROBINSON RED A/P 18FR (CATHETERS) ×3 IMPLANT
CATH SUCT 10FR WHISTLE TIP (CATHETERS) ×3 IMPLANT
CATH SUCT ARGYLE CHIMNEY 10FR (CATHETERS) ×1 IMPLANT
CLIP VESOCCLUDE MED 6/CT (CLIP) ×3 IMPLANT
CLIP VESOCCLUDE SM WIDE 6/CT (CLIP) ×3 IMPLANT
COVER PROBE W GEL 5X96 (DRAPES) IMPLANT
DERMABOND ADVANCED (GAUZE/BANDAGES/DRESSINGS) ×1
DERMABOND ADVANCED .7 DNX12 (GAUZE/BANDAGES/DRESSINGS) ×2 IMPLANT
ELECT REM PT RETURN 9FT ADLT (ELECTROSURGICAL) ×3
ELECTRODE REM PT RTRN 9FT ADLT (ELECTROSURGICAL) ×2 IMPLANT
GEL ULTRASOUND 8.5O AQUASONIC (MISCELLANEOUS) ×1 IMPLANT
GLOVE SURG SS PI 7.5 STRL IVOR (GLOVE) ×9 IMPLANT
GOWN STRL REUS W/ TWL LRG LVL3 (GOWN DISPOSABLE) ×4 IMPLANT
GOWN STRL REUS W/ TWL XL LVL3 (GOWN DISPOSABLE) ×2 IMPLANT
GOWN STRL REUS W/TWL LRG LVL3 (GOWN DISPOSABLE) ×2
GOWN STRL REUS W/TWL XL LVL3 (GOWN DISPOSABLE) ×1
HEMOSTAT SNOW SURGICEL 2X4 (HEMOSTASIS) IMPLANT
INSERT FOGARTY SM (MISCELLANEOUS) IMPLANT
KIT BASIN OR (CUSTOM PROCEDURE TRAY) ×3 IMPLANT
KIT TURNOVER KIT B (KITS) ×3 IMPLANT
NDL HYPO 25GX1X1/2 BEV (NEEDLE) IMPLANT
NEEDLE HYPO 25GX1X1/2 BEV (NEEDLE) IMPLANT
NS IRRIG 1000ML POUR BTL (IV SOLUTION) ×9 IMPLANT
PACK CAROTID (CUSTOM PROCEDURE TRAY) ×3 IMPLANT
PAD ARMBOARD 7.5X6 YLW CONV (MISCELLANEOUS) ×6 IMPLANT
PATCH VASC XENOSURE 1CMX6CM (Vascular Products) ×1 IMPLANT
PATCH VASC XENOSURE 1X6 (Vascular Products) IMPLANT
POSITIONER HEAD DONUT 9IN (MISCELLANEOUS) ×3 IMPLANT
SET WALTER ACTIVATION W/DRAPE (SET/KITS/TRAYS/PACK) IMPLANT
SHUNT CAROTID BYPASS 10 (VASCULAR PRODUCTS) IMPLANT
SHUNT CAROTID BYPASS 12FRX15.5 (VASCULAR PRODUCTS) IMPLANT
SPONGE INTESTINAL PEANUT (DISPOSABLE) ×1 IMPLANT
SURGIFLO W/THROMBIN 8M KIT (HEMOSTASIS) ×1 IMPLANT
SUT ETHILON 3 0 PS 1 (SUTURE) IMPLANT
SUT PROLENE 6 0 BV (SUTURE) ×8 IMPLANT
SUT SILK 3 0 (SUTURE) ×1
SUT SILK 3-0 18XBRD TIE 12 (SUTURE) IMPLANT
SUT VIC AB 3-0 SH 27 (SUTURE) ×2
SUT VIC AB 3-0 SH 27X BRD (SUTURE) ×4 IMPLANT
SUT VIC AB 3-0 X1 27 (SUTURE) ×3 IMPLANT
SYR CONTROL 10ML LL (SYRINGE) IMPLANT
TOWEL GREEN STERILE (TOWEL DISPOSABLE) ×3 IMPLANT
WATER STERILE IRR 1000ML POUR (IV SOLUTION) ×3 IMPLANT

## 2022-02-01 NOTE — Transfer of Care (Signed)
Immediate Anesthesia Transfer of Care Note  Patient: Timothy Golden  Procedure(s) Performed: LEFT CAROTID ENDARTERECTOMY (Left: Neck) PATCH ANGIOPLASTY OF LEFT CAROTID ARTERY USING XENOSURE BOVINE PERICARDIUM PATCH (Right: Neck)  Patient Location: PACU  Anesthesia Type:General  Level of Consciousness: awake, drowsy, patient cooperative and responds to stimulation  Airway & Oxygen Therapy: Patient Spontanous Breathing and Patient connected to face mask oxygen  Post-op Assessment: Report given to RN, Post -op Vital signs reviewed and stable and Patient moving all extremities X 4  Post vital signs: Reviewed and stable  Last Vitals:  Vitals Value Taken Time  BP 125/45 02/01/22 1145  Temp    Pulse 70 02/01/22 1145  Resp 18 02/01/22 1145  SpO2 98 % 02/01/22 1145  Vitals shown include unvalidated device data.  Last Pain:  Vitals:   02/01/22 0702  TempSrc:   PainSc: 0-No pain         Complications:  Encounter Notable Events  Notable Event Outcome Phase Comment  Difficult to intubate - expected  Intraprocedure Filed from anesthesia note documentation.

## 2022-02-01 NOTE — Anesthesia Procedure Notes (Addendum)
Procedure Name: Intubation Date/Time: 02/01/2022 8:39 AM  Performed by: Cathren Harsh, CRNAPre-anesthesia Checklist: Patient identified, Emergency Drugs available, Suction available and Patient being monitored Patient Re-evaluated:Patient Re-evaluated prior to induction Oxygen Delivery Method: Circle System Utilized Preoxygenation: Pre-oxygenation with 100% oxygen Induction Type: IV induction Ventilation: Mask ventilation without difficulty Laryngoscope Size: Glidescope and 4 Grade View: Grade I Tube type: Oral Tube size: 7.5 mm Number of attempts: 1 Airway Equipment and Method: Stylet and Oral airway Placement Confirmation: ETT inserted through vocal cords under direct vision, positive ETCO2 and breath sounds checked- equal and bilateral Secured at: 25 cm Tube secured with: Tape Dental Injury: Teeth and Oropharynx as per pre-operative assessment

## 2022-02-01 NOTE — Anesthesia Procedure Notes (Signed)
Arterial Line Insertion Start/End6/14/2023 8:12 AM, 02/01/2022 8:19 AM Performed by: Ruthe Mannan, CRNA, CRNA  Patient location: Pre-op. Preanesthetic checklist: patient identified, IV checked, site marked, risks and benefits discussed, surgical consent, monitors and equipment checked, pre-op evaluation, timeout performed and anesthesia consent Lidocaine 1% used for infiltration Right, radial was placed Catheter size: 20 G Hand hygiene performed , maximum sterile barriers used  and Seldinger technique used Allen's test indicative of satisfactory collateral circulation Attempts: 1 Procedure performed using ultrasound guided technique. Following insertion, dressing applied. Post procedure assessment: normal and unchanged

## 2022-02-01 NOTE — Interval H&P Note (Signed)
History and Physical Interval Note:  02/01/2022 7:25 AM  Timothy Golden  has presented today for surgery, with the diagnosis of Bilateral carotid stenosis.  The various methods of treatment have been discussed with the patient and family. After consideration of risks, benefits and other options for treatment, the patient has consented to  Procedure(s): LEFT CAROTID ENDARTERECTOMY (Left) as a surgical intervention.  The patient's history has been reviewed, patient examined, no change in status, stable for surgery.  I have reviewed the patient's chart and labs.  Questions were answered to the patient's satisfaction.     Annamarie Major

## 2022-02-01 NOTE — Anesthesia Postprocedure Evaluation (Signed)
Anesthesia Post Note  Patient: Timothy Golden  Procedure(s) Performed: LEFT CAROTID ENDARTERECTOMY (Left: Neck) PATCH ANGIOPLASTY OF LEFT CAROTID ARTERY USING XENOSURE BOVINE PERICARDIUM PATCH (Right: Neck)     Patient location during evaluation: PACU Anesthesia Type: General Level of consciousness: awake and alert, oriented and patient cooperative Pain management: pain level controlled Vital Signs Assessment: post-procedure vital signs reviewed and stable Respiratory status: spontaneous breathing, nonlabored ventilation and respiratory function stable Cardiovascular status: blood pressure returned to baseline and stable Postop Assessment: no apparent nausea or vomiting Anesthetic complications: yes     Last Vitals:  Vitals:   02/01/22 1200 02/01/22 1215  BP: (!) 121/46 (!) 120/49  Pulse: 63 64  Resp: 14 (!) 4  Temp:    SpO2: 98% 96%    Last Pain:  Vitals:   02/01/22 1200  TempSrc:   PainSc: 0-No pain                 [name]

## 2022-02-01 NOTE — Progress Notes (Signed)
  Day of Surgery Note    Subjective:  resting comfortably up on the floor.  Says he is a little sore.     Vitals:   02/01/22 1254 02/01/22 1300  BP: (!) 125/47 (!) 131/46  Pulse: 66 62  Resp: 15 11  Temp: 97.6 F (36.4 C)   SpO2: 92% 95%    Incisions:   clean and dry Neuro:  moving all extremities equally; tongue is midline Lungs:  non labored    Assessment/Plan:  This is a 56 y.o. male who is s/p  Left carotid endarterectomy  -pt doing well and neuro in tact -plan for discharge home tomorrow if evening is uneventful    Doreatha Massed, PA-C 02/01/2022 2:23 PM [phone]

## 2022-02-01 NOTE — Discharge Instructions (Signed)
   [facility]  Discharge Instructions   Carotid Surgery  Please refer to the following instructions for your post-procedure care. Your surgeon or physician assistant will discuss any changes with you.  Activity  You are encouraged to walk as much as you can. You can slowly return to normal activities but must avoid strenuous activity and heavy lifting until your doctor tell you it's okay. Avoid activities such as vacuuming or swinging a golf club. You can drive after one week if you are comfortable and you are no longer taking prescription pain medications. It is normal to feel tired for serval weeks after your surgery. It is also normal to have difficulty with sleep habits, eating, and bowel movements after surgery. These will go away with time.  Bathing/Showering  Shower daily after you go home. Do not soak in a bathtub, hot tub, or swim until the incision heals completely.  Incision Care  Shower every day. Clean your incision with mild soap and water. Pat the area dry with a clean towel. You do not need a bandage unless otherwise instructed. Do not apply any ointments or creams to your incision. You may have skin glue on your incision. Do not peel it off. It will come off on its own in about one week. Your incision may feel thickened and raised for several weeks after your surgery. This is normal and the skin will soften over time.   For Men Only: It's okay to shave around the incision but do not shave the incision itself for 2 weeks. It is common to have numbness under your chin that could last for several months.  Diet  Resume your normal diet. There are no special food restrictions following this procedure. A low fat/low cholesterol diet is recommended for all patients with vascular disease. In order to heal from your surgery, it is CRITICAL to get adequate nutrition. Your body requires vitamins, minerals, and protein. Vegetables are the best source of  vitamins and minerals. Vegetables also provide the perfect balance of protein. Processed food has little nutritional value, so try to avoid this.  Medications  Resume taking all of your medications unless your doctor or physician assistant tells you not to. If your incision is causing pain, you may take over-the- counter pain relievers such as acetaminophen (Tylenol). If you were prescribed a stronger pain medication, please be aware these medications can cause nausea and constipation. Prevent nausea by taking the medication with a snack or meal. Avoid constipation by drinking plenty of fluids and eating foods with a high amount of fiber, such as fruits, vegetables, and grains.   Do not take Tylenol if you are taking prescription pain medications.  Follow Up  Our office will schedule a follow up appointment 2-3 weeks following discharge.  Please call us immediately for any of the following conditions  . Increased pain, redness, drainage (pus) from your incision site. . Fever of 101 degrees or higher. . If you should develop stroke (slurred speech, difficulty swallowing, weakness on one side of your body, loss of vision) you should call 911 and go to the nearest emergency room. .  Reduce your risk of vascular disease:  . Stop smoking. If you would like help call [facility] at 1-800-QUIT-NOW (1-800-784-8669) or [place] at 336-586-4000. . Manage your cholesterol . Maintain a desired weight . Control your diabetes . Keep your blood pressure down .  If you have any questions, please call the office at [phone]. 

## 2022-02-01 NOTE — Op Note (Signed)
Patient name: CHAUNCEY SCIULLI MRN: [id number] DOB: 1965/12/31 Sex: male  02/01/2022 Pre-operative Diagnosis: Asymptomatic    left carotid stenosis Post-operative diagnosis:  Same Surgeon:  Durene Cal Assistants:  Aggie Moats Procedure:    left carotid Endarterectomy with bovine pericardial patch angioplasty Anesthesia:  General Blood Loss:  100 cc Specimens:  Carotid Plaque to pathology  Findings:  80 %stenosis; Thrombus:  none  Indications: This is a 56 year old gentleman with progressive asymptomatic bilateral carotid stenosis, left greater than right.  He comes in today for endarterectomy.  Procedure:  The patient was identified in the holding area and taken to [facility] OR ROOM 12  The patient was then placed supine on the table.   General endotrachial anesthesia was administered.  The patient was prepped and draped in the usual sterile fashion.  A time out was called and antibiotics were administered.  A PA was necessary to expedite the procedure and assist with technical details  The incision was made along the anterior border of the left sternocleidomastoid muscle.  Cautery was used to dissect through the subcutaneous tissue.  The platysma muscle was divided with cautery.  The internal jugular vein was exposed along its anterior medial border.  The common facial vein was exposed and then divided between 2-0 silk ties and metal clips.  The common carotid artery was then circumferentially exposed and encircled with an umbilical tape.  The vagus nerve was identified and protected.  Next sharp dissection was used to expose the external carotid artery and the superior thyroid artery.  The were encircled with a blue vessel loop and a 2-0 silk tie respectively.  Finally, the internal carotid was carefully dissected free.  An umbilical tape was placed around the internal carotid artery distal to the diseased segment.  The hypoglossal nerve was visualized throughout and protected.  The patient was  given systemic heparinization.  A bovine carotid patch was selected and prepared on the back table.  A 10 french shunt was also prepared.  After blood pressure readings were appropriate and the heparin had been given time to circulate, the internal carotid artery was occluded with a baby Gregory clamp.  The external and common carotid arteries were then occluded with vascular clamps and the 2-0 tie tightened on the superior thyroid artery.  A #11 blade was used to make an arteriotomy in the common carotid artery.  This was extended with Potts scissors along the anterior and lateral border of the common and internal carotid artery.  Approximately 80% stenosis was identified.  There was no thrombus identified.  The 10 french shunt was not placed, due to the exposure and the distal extent of the lesion.  He also had pulsatile backbleeding..  A kleiner kuntz elevator was used to perform endarterectomy.  An eversion endarterectomy was performed in the external carotid artery.  A good distal endpoint was obtained in the internal carotid artery.  The specimen was removed and sent to pathology.  Heparinized saline was used to irrigate the endarterectomized field.  All potential embolic debris was removed.  Bovine pericardial patch angioplasty was then performed using a running 6-0 Prolene with the PA providing exposure and following of the suture.  The common internal and external carotid arteries were all appropriately flushed. The artery was again irrigated with heparin saline.  The anastomosis was then secured. The clamp was first released on the external carotid artery followed by the common carotid artery approximately 30 seconds later, bloodflow was reestablish through the internal  carotid artery.  Next, a hand-held  Doppler was used to evaluate the signals in the common, external, and internal  carotid arteries, all of which had appropriate signals. I then administered  50 mg protamine. The wound was then  irrigated.  After hemostasis was achieved, the carotid sheath was reapproximated with 3-0 Vicryl. The  platysma muscle was reapproximated with running 3-0 Vicryl. The skin  was closed with 4-0 Vicryl. Dermabond was placed on the skin. The  patient was then successfully extubated. His neurologic exam was  similar to his preprocedural exam. The patient was then taken to recovery room  in stable condition. There were no complications.     Disposition:  To PACU in stable condition.  Relevant Operative Details: Somewhat difficult exposure due to soft tissue and depth of the artery.  The distal extent of the disease within the internal carotid was more distal than I had anticipated based off of his CT scan and ultrasound.  This prevented me from placing a shunt.  Fortunately he had good backbleeding.  I was able to remove all of the plaque within the common and internal carotid arteries and got a good distal endpoint.  A bovine patch was utilized.  Juleen China, M.D., [facility] [place] Office: [phone] Pager:  [phone]

## 2022-02-02 ENCOUNTER — Encounter (HOSPITAL_COMMUNITY): Payer: Self-pay | Admitting: Surgery

## 2022-02-02 LAB — BASIC METABOLIC PANEL
Anion gap: 8 (ref 5–15)
BUN: 17 mg/dL (ref 6–20)
CO2: 22 mmol/L (ref 22–32)
Calcium: 8.8 mg/dL — ABNORMAL LOW (ref 8.9–10.3)
Chloride: 102 mmol/L (ref 98–111)
Creatinine, Ser: 1.01 mg/dL (ref 0.61–1.24)
GFR, Estimated: >60 mL/min (ref >60)
Glucose, Bld: 253 mg/dL — ABNORMAL HIGH (ref 70–99)
Potassium: 3.9 mmol/L (ref 3.5–5.1)
Sodium: 132 mmol/L — ABNORMAL LOW (ref 135–145)

## 2022-02-02 LAB — CBC
HCT: 31.8 % — ABNORMAL LOW (ref 39.0–52.0)
Hemoglobin: 10.7 g/dL — ABNORMAL LOW (ref 13.0–17.0)
MCH: 29.4 pg (ref 26.0–34.0)
MCHC: 33.6 g/dL (ref 30.0–36.0)
MCV: 87.4 fL (ref 80.0–100.0)
Platelets: 264 10*3/uL (ref 150–400)
RBC: 3.64 MIL/uL — ABNORMAL LOW (ref 4.22–5.81)
RDW: 13.2 % (ref 11.5–15.5)
WBC: 9.5 10*3/uL (ref 4.0–10.5)
nRBC: 0 % (ref 0.0–0.2)

## 2022-02-02 LAB — GLUCOSE, CAPILLARY: Glucose-Capillary: 275 mg/dL — ABNORMAL HIGH (ref 70–99)

## 2022-02-02 MED ORDER — OXYCODONE-ACETAMINOPHEN 5-325 MG PO TABS: 1.0000 | ORAL_TABLET | Freq: Four times a day (QID) | ORAL | 0 refills | Status: DC | PRN

## 2022-02-02 NOTE — Progress Notes (Signed)
  Progress Note    02/02/2022 6:44 AM 1 Day Post-Op  Subjective:  says his neck is sore.  No trouble swallowing.  Has voided.   Afebrile HR 50's-70's 120's-140's systolic 97% RA  Gtts:  none  Vitals:   02/02/22 0000 02/02/22 0339  BP:  (!) 129/50  Pulse: (!) 59 63  Resp: 14 15  Temp:  97.9 F (36.6 C)  SpO2: 94% 97%     Physical Exam: Neuro:  in tact; tongue is midline Lungs:  non labored Incision:  clean and dry  CBC    Component Value Date/Time   WBC 9.5 02/02/2022 0417   RBC 3.64 (L) 02/02/2022 0417   HGB 10.7 (L) 02/02/2022 0417   HCT 31.8 (L) 02/02/2022 0417   PLT 264 02/02/2022 0417   MCV 87.4 02/02/2022 0417   MCH 29.4 02/02/2022 0417   MCHC 33.6 02/02/2022 0417   RDW 13.2 02/02/2022 0417    BMET    Component Value Date/Time   NA 132 (L) 02/02/2022 0417   NA 137 01/05/2022 0730   K 3.9 02/02/2022 0417   CL 102 02/02/2022 0417   CO2 22 02/02/2022 0417   GLUCOSE 253 (H) 02/02/2022 0417   BUN 17 02/02/2022 0417   BUN 21 01/05/2022 0730   CREATININE 1.01 02/02/2022 0417   CALCIUM 8.8 (L) 02/02/2022 0417   GFRNONAA >60 02/02/2022 0417     Intake/Output Summary (Last 24 hours) at 02/02/2022 0644 Last data filed at 02/02/2022 0428 Gross per 24 hour  Intake 4360.19 ml  Output 3650 ml  Net 710.19 ml     Assessment/Plan:  This is a 56 y.o. male who is s/p left CEA 1 Day Post-Op  -pt is doing well this am. -pt neuro exam is in tact -pt has ambulated to the restroom -pt has voided -f/u with [facility] in 2-3 weeks    Doreatha Massed, PA-C [facility] [phone]

## 2022-02-02 NOTE — TOC Transition Note (Signed)
Transition of Care (TOC) - CM/SW Discharge Note Donn Pierini RN, BSN Transitions of Care Unit 4E- RN Case Manager See Treatment Team for direct phone #    Patient Details  Name: Timothy Golden MRN: [id number] Date of Birth: 1966/04/10  Transition of Care [facility]) CM/SW Contact:  Darrold Span, RN Phone Number: 02/02/2022, 10:44 AM   Clinical Narrative:    Pt stable for transition home today s/p CEA. Transition of Care Department [facility]) has reviewed patient and no TOC needs have been identified at this time.   Final next level of care: Home/Self Care Barriers to Discharge: No Barriers Identified   Patient Goals and CMS Choice     Choice offered to / list presented to : NA  Discharge Placement                 Home      Discharge Plan and Services     Post Acute Care Choice: NA                               Social Determinants of Health (SDOH) Interventions     Readmission Risk Interventions    02/02/2022   10:44 AM  Readmission Risk Prevention Plan  Post Dischage Appt Complete  Medication Screening Complete  Transportation Screening Complete

## 2022-02-02 NOTE — Progress Notes (Signed)
Mobility Specialist Progress Note:   02/02/22 0930  Mobility  Activity Ambulated independently in hallway  Level of Assistance Independent  Assistive Device None  Distance Ambulated (ft) 550 ft  Activity Response Tolerated well  $Mobility charge 1 Mobility   Pt ambulating independently  in hallway with wife.   [facility] Timothy Golden Mobility Specialist

## 2022-02-02 NOTE — Discharge Summary (Signed)
Discharge Summary     Timothy Golden 1966/08/16 56 y.o. male  [id number]  Admission Date: 02/01/2022  Discharge Date: 02/02/2022  Physician: Nada Libman, MD  Admission Diagnosis: Carotid artery stenosis [I65.29]   HPI:   This is a 56 y.o. male who I was consulted on in April 2022, when the patient presented with chest pain and was diagnosed with a NSTEMI.  He was scheduled for cardiac surgery but his preoperative carotid duplex showed significant bilateral stenosis, left greater than right.  I sent him for CT scan to better evaluate his disease and this only indicated a 70% left-sided stenosis and 60% right.  Therefore, no carotid intervention was performed.  He went on to have CABG.  The patient has a history of stroke in the remote past.  However, this happened around the time of the trauma with a bungee cord injury to his neck.  His symptoms were mainly dizziness.  He is currently without symptoms of numbness or weakness in either extremity, slurred speech, or amaurosis fugax.  The patient is a diabetic.  He takes a statin for hypercholesterolemia.  He is medically managed for hypertension.  He is a non-smoker  Hospital Course:  The patient was admitted to the hospital and taken to the operating room on 02/01/2022 and underwent left CEA with bovine pericardial patch angioplasty    Findings: 80% stenosis; thrombus none  The pt tolerated the procedure well and was transported to the PACU in good condition.   By POD 1, the pt neuro status was in tact.  He was doing well and swallowing without difficulty, ambulating and able to void. He was discharged home.    Recent Labs    02/01/22 0705 02/01/22 0915 02/02/22 0417  NA 133* 136 132*  K 4.8 4.4 3.9  CL 101  --  102  CO2 22  --  22  GLUCOSE 187*  --  253*  BUN 26*  --  17  CALCIUM 9.3  --  8.8*   Recent Labs    02/01/22 0915 02/02/22 0417  WBC  --  9.5  HGB 9.9* 10.7*  HCT 29.0* 31.8*  PLT  --  264   No  results for input(s): "INR" in the last 72 hours.   Discharge Instructions     Discharge patient   Complete by: As directed    Discharge pt after he has eaten, walked in the hallway and been seen by MD.  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   Type 1 diabetes mellitus with other specified complication (HCC) 12/17/2020   NSTEMI (non-ST elevated myocardial infarction) (HCC) 12/16/2020   Past Medical History:  Diagnosis Date   Anginal pain (HCC)    Arthritis    right shoulder   Coronary artery disease    Diabetes mellitus without complication (HCC)    HLD (hyperlipidemia)    Hypertension    Myocardial infarction (HCC)    Obesity    Stroke (HCC)     Allergies as of 02/02/2022       Reactions   Ace Inhibitors Cough   Shellfish Allergy Hives, Rash        Medication  List     TAKE these medications    acetaminophen 325 MG tablet Commonly known as: TYLENOL Take 2 tablets (650 mg total) by mouth every 6 (six) hours as needed for mild pain.   allopurinol 100 MG tablet Commonly known as: ZYLOPRIM Take 1 tablet (100 mg total) by mouth daily.   amLODipine 10 MG tablet Commonly known as: NORVASC Take 1 tablet (10 mg total) by mouth daily.   aspirin EC 81 MG tablet Take 81 mg by mouth daily.   atorvastatin 80 MG tablet Commonly known as: LIPITOR Take 1 tablet (80 mg total) by mouth daily.   carvedilol 25 MG tablet Commonly known as: COREG Take 1 tablet (25 mg total) by mouth 2 (two) times daily.   chlorthalidone 25 MG tablet Commonly known as: HYGROTON Take 1 tablet (25 mg total) by mouth daily.   clopidogrel 75 MG tablet Commonly known as: PLAVIX Take 1 tablet  (75 mg total) by mouth daily.   ezetimibe 10 MG tablet Commonly known as: ZETIA Take 1 tablet (10 mg total) by mouth daily.   HumaLOG 100 UNIT/ML injection Generic drug: insulin lispro Inject 30-40 Units into the skin 3 (three) times daily with meals. Sliding Scale   hydrALAZINE 25 MG tablet Commonly known as: APRESOLINE Take 1 tablet (25 mg total) by mouth 3 (three) times daily.   irbesartan 300 MG tablet Commonly known as: AVAPRO Take 1 tablet (300 mg total) by mouth daily.   metFORMIN 500 MG 24 hr tablet Commonly known as: GLUCOPHAGE-XR Take 500 mg by mouth 2 (two) times daily.   oxyCODONE-acetaminophen 5-325 MG tablet Commonly known as: Percocet Take 1 tablet by mouth every 6 (six) hours as needed for severe pain.   Semaglutide(0.25 or 0.5MG /DOS) 2 MG/1.5ML Sopn Inject 0.5 mg into the skin once a week.   spironolactone 25 MG tablet Commonly known as: ALDACTONE Take 0.5 tablets (12.5 mg total) by mouth daily.   Toujeo Max SoloStar 300 UNIT/ML Solostar Pen Generic drug: insulin glargine (2 Unit Dial) Inject 35-40 Units into the skin daily.         [facility] Discharge Instructions Carotid Endarterectomy (CEA)  Please refer to the following instructions for your post-procedure care. Your surgeon or physician assistant will discuss any changes with you.  Activity  You are encouraged to walk as much as you can. You can slowly return to normal activities but must avoid strenuous activity and heavy lifting until your doctor tell you it's OK. Avoid activities such as vacuuming or swinging a golf club. You can drive after one week if you are comfortable and you are no longer taking prescription pain medications. It is normal to feel tired for serval weeks after your surgery. It is also normal to have difficulty with sleep habits, eating, and bowel movements after surgery. These will go away with time.  Bathing/Showering  You may shower  after you come home. Do not soak in a bathtub, hot tub, or swim until the incision heals completely.  Incision Care  Shower every day. Clean your incision with mild soap and water. Pat the area dry with a clean towel. You do not need a bandage unless otherwise instructed. Do not apply any ointments or creams to your incision. You may have skin glue on your incision. Do not peel it off. It will come off on its own in about one week. Your incision may feel thickened and raised for several weeks after your surgery. This  is normal and the skin will soften over time. For Men Only: It's OK to shave around the incision but do not shave the incision itself for 2 weeks. It is common to have numbness under your chin that could last for several months.  Diet  Resume your normal diet. There are no special food restrictions following this procedure. A low fat/low cholesterol diet is recommended for all patients with vascular disease. In order to heal from your surgery, it is CRITICAL to get adequate nutrition. Your body requires vitamins, minerals, and protein. Vegetables are the best source of vitamins and minerals. Vegetables also provide the perfect balance of protein. Processed food has little nutritional value, so try to avoid this.  Medications  Resume taking all of your medications unless your doctor or physician assistant tells you not to.  If your incision is causing pain, you may take over-the- counter pain relievers such as acetaminophen (Tylenol). If you were prescribed a stronger pain medication, please be aware these medications can cause nausea and constipation.  Prevent nausea by taking the medication with a snack or meal. Avoid constipation by drinking plenty of fluids and eating foods with a high amount of fiber, such as fruits, vegetables, and grains.  Do not take Tylenol if you are taking prescription pain medications.  Follow Up  Our office will schedule a follow up appointment 2-3 weeks  following discharge.  Please call us immediately for any of the following conditions  Increased pain, redness, drainage (pus) from your incision site. Fever of 101 degrees or higher. If you should develop stroke (slurred speech, difficulty swallowing, weakness on one side of your body, loss of vision) you should call 911 and go to the nearest emergency room.  Reduce your risk of vascular disease:  Stop smoking. If you would like help call [facility] at 1-800-QUIT-NOW (845-498-8397) or [facility] at 402-696-6735. Manage your cholesterol Maintain a desired weight Control your diabetes Keep your blood pressure down  If you have any questions, please call the office at [phone].  Prescriptions given: 1.   Roxicet #8 No Refill  Disposition: home  Patient's condition: is Good  Follow up: 1. [facility] in 2-3weeks    Doreatha Massed, PA-C [facility] [phone]   --- For [facility] Registry use ---   Modified Rankin score at D/C (0-6): 0  IV medication needed for:  1. Hypertension: No 2. Hypotension: No  Post-op Complications: No  1. Post-op CVA or TIA: No  If yes: Event classification (right eye, left eye, right cortical, left cortical, verterobasilar, other): n/a  If yes: Timing of event (intra-op, <6 hrs post-op, >=6 hrs post-op, unknown): n/a  2. CN injury: No  If yes: CN n/a injuried   3. Myocardial infarction: No  If yes: Dx by (EKG or clinical, Troponin): n/a  4.  CHF: No  5.  Dysrhythmia (new): No  6. Wound infection: No  7. Reperfusion symptoms: No  8. Return to OR: No  If yes: return to OR for (bleeding, neurologic, other CEA incision, other): n/a  Discharge medications: Statin use:  Yes ASA use:  Yes   Beta blocker use:  Yes ACE-Inhibitor use:  No  ARB use:  No CCB use: No P2Y12 Antagonist use: Yes, [ x] Plavix, [ ]  Plasugrel, [ ]  Ticlopinine, [ ]  Ticagrelor, [ ]  Other, [ ]  No for medical reason, [ ]  Non-compliant, [ ]   Not-indicated Anti-coagulant use:  No, [ ]  Warfarin, [ ]  Rivaroxaban, [ ]  Dabigatran,

## 2022-02-02 NOTE — Progress Notes (Signed)
Went over discharge paper work with patient and wife. All questions answered. PIV removed.All belongings at bedside.

## 2022-02-05 ENCOUNTER — Encounter: Payer: Self-pay | Admitting: Surgery

## 2022-02-07 ENCOUNTER — Encounter: Payer: Self-pay | Admitting: Surgery

## 2022-02-14 ENCOUNTER — Ambulatory Visit: Payer: 59 | Admitting: Pharmacist

## 2022-02-14 DIAGNOSIS — E782 Mixed hyperlipidemia: Secondary | ICD-10-CM

## 2022-02-14 DIAGNOSIS — I2510 Atherosclerotic heart disease of native coronary artery without angina pectoris: Secondary | ICD-10-CM | POA: Diagnosis not present

## 2022-02-14 DIAGNOSIS — I10 Essential (primary) hypertension: Secondary | ICD-10-CM | POA: Diagnosis not present

## 2022-02-14 NOTE — Progress Notes (Signed)
Patient ID: Timothy Golden                 DOB: 23-Oct-1965                      MRN: [id number]     HPI: Timothy Golden is a 56 y.o. male referred by Dr. Eldridge Dace to HTN clinic. PMH is significant for HTN, HLD, DM, obesity, multivessel CAD s/p CABG and stroke. Patient underwent 4-vessel CABG on 12/20/20. His losartan dose was decreased to 25mg  daily post op. At follow up visit with , his losartan was increased to 50mg  daily and later 100mg  daily. He sent a my chart message 6/12 that his BP was still high. Amlodipine 5mg  daily was added and he was referred to the HTN clinic.  At initial HTN clinic apt, amlodipine was increased to 10mg  daily and discussed diet improvements. At next visit, chlorthalidone 12.5mg  daily was started and then increased to 25mg  daily. He was seen by Dr. on 8/25 where his metoprolol was changed to carvedilol 6.25mg  twice a day. Patient was lost to follow up briefly.   He messaged the HTN clinic about his high blood pressure. Losartan was changed to irbesartan 300mg  daily. He was seen in clinic in Dec 2022 and carvedilol was increased to 12.5mg  twice a day. At next visit in Feb 2023, spironolactone 12.5 mg daily was started and allopurinol 50 mg started for uric acid > 5.5. Three weeks later, BP was still above goal >130/80 and carvedilol was increased to 25 mg BID.  At last visit on 01/06/22 hydralazine 25mg  three times a day was started. Pt scr bumped, therefore spironolactone could not be increased. Improved with hydration.  Patient presents today to clinic. He was recently started on ozempic. Seeing better blood sugar control. A little nausea and dizziness. Walking about a mile 6 days a week. Compliant with medications. Blood pressure in the high 140's systolic. HR typically around 65. Compliant with his medications.  Dizziness, lightheadedness, headache, blurred vision, SOB, swelling 7 AM, 3 PM, 9PM    Current HTN meds:  Amlodipine 10 mg  daily Irbesartan 300 mg daily Carvedilol 25 mg twice a day Chlorthalidone 25 mg daily Spironolactone 12.5 mg daily Hydralazine 25mg  three times a day  Previously tried: atenolol, metoprolol, HCTZ, losartan BP goal: <130/80  Family History: Father - DM, HTN   Social History: never smoked, drinks socially  Diet: eating less with ozempic Salad for lunch 4 days a week Burritos, tacos -made at home Broccoli, green beans, peas, kale Breakfast: english muffin w/ PB and butter Drink: diet coke, diet snapple, Gatorade zero, some water  Exercise: walking 6 times a week  Home BP readings:  High 130's/65  Wt Readings from Last 3 Encounters:  02/01/22 287 lb 4.8 oz (130.3 kg)  01/25/22 288 lb 1.6 oz (130.7 kg)  01/09/22 292 lb (132.5 kg)   BP Readings from Last 3 Encounters:  02/02/22 (!) 137/42  01/25/22 116/62  01/09/22 129/82   Pulse Readings from Last 3 Encounters:  02/02/22 72  01/25/22 68  01/09/22 60    Renal function: Estimated Creatinine Clearance: 114 mL/min (by C-G formula based on SCr of 1.01 mg/dL).  Past Medical History:  Diagnosis Date   Anginal pain (HCC)    Arthritis    right shoulder   Coronary artery disease    Diabetes mellitus without complication (HCC)    HLD (hyperlipidemia)    Hypertension  Myocardial infarction (HCC)    Obesity    Stroke [facility])     Current Outpatient Medications on File Prior to Visit  Medication Sig Dispense Refill   acetaminophen (TYLENOL) 325 MG tablet Take 2 tablets (650 mg total) by mouth every 6 (six) hours as needed for mild pain.     allopurinol (ZYLOPRIM) 100 MG tablet Take 1 tablet (100 mg total) by mouth daily. 90 tablet 3   amLODipine (NORVASC) 10 MG tablet Take 1 tablet (10 mg total) by mouth daily. 90 tablet 3   aspirin 81 MG EC tablet Take 81 mg by mouth daily.     atorvastatin (LIPITOR) 80 MG tablet Take 1 tablet (80 mg total) by mouth daily. 90 tablet 3   carvedilol (COREG) 25 MG tablet Take 1  tablet (25 mg total) by mouth 2 (two) times daily. 180 tablet 3   chlorthalidone (HYGROTON) 25 MG tablet Take 1 tablet (25 mg total) by mouth daily. 90 tablet 3   clopidogrel (PLAVIX) 75 MG tablet Take 1 tablet (75 mg total) by mouth daily. 90 tablet 3   ezetimibe (ZETIA) 10 MG tablet Take 1 tablet (10 mg total) by mouth daily. 90 tablet 3   HUMALOG 100 UNIT/ML injection Inject 30-40 Units into the skin 3 (three) times daily with meals. Sliding Scale     hydrALAZINE (APRESOLINE) 25 MG tablet Take 1 tablet (25 mg total) by mouth 3 (three) times daily. 270 tablet 3   irbesartan (AVAPRO) 300 MG tablet Take 1 tablet (300 mg total) by mouth daily. 90 tablet 3   metFORMIN (GLUCOPHAGE-XR) 500 MG 24 hr tablet Take 500 mg by mouth 2 (two) times daily.     oxyCODONE-acetaminophen (PERCOCET) 5-325 MG tablet Take 1 tablet by mouth every 6 (six) hours as needed for severe pain. 8 tablet 0   Semaglutide,0.25 or 0.5MG /DOS, 2 MG/1.5ML SOPN Inject 0.5 mg into the skin once a week. 3 mL 0   spironolactone (ALDACTONE) 25 MG tablet Take 0.5 tablets (12.5 mg total) by mouth daily. 45 tablet 3   TOUJEO MAX SOLOSTAR 300 UNIT/ML Solostar Pen Inject 35-40 Units into the skin daily.     No current facility-administered medications on file prior to visit.    Allergies  Allergen Reactions   Ace Inhibitors Cough   Shellfish Allergy Hives and Rash    There were no vitals taken for this visit.   Assessment/Plan:  1. Hypertension - Blood pressure is at goal of <130/80 in clinic. Home readings have been in the 130's. Patient is checking shortly after he wakes up which explains the higher readings. Continue amlodipine 10 mg daily, irbesartan 300 mg daily, carvedilol 25 mg twice a day, chlorthalidone 25 mg daily, spironolactone 12.5 mg daily and hydralazine 25mg  three times a day. Will follow up via telephone in 1 month. I have asked him to check his blood pressure in the afternoon or evening some.   2. Hyperlipidemia -   - Risk factors of premature CAD and DM and currently above LDL goal of < 55 mg/dL (97 on ) on atorvastatin 80 mg daily. Started on ezetimibe 10 mg daily in April 2023 and due for follow-up lipid panel 7/27.    Thank you  8/27, Pharm.D, BCPS, CPP [facility]  [address], [place], [place]  Phone: [phone]; Fax: [phone]

## 2022-02-20 ENCOUNTER — Ambulatory Visit (INDEPENDENT_AMBULATORY_CARE_PROVIDER_SITE_OTHER): Payer: 59 | Admitting: Surgery

## 2022-02-20 ENCOUNTER — Encounter: Payer: Self-pay | Admitting: Surgery

## 2022-02-20 VITALS — BP 137/77 | HR 65 | Temp 97.9°F | Resp 20 | Ht 72.0 in | Wt 283.0 lb

## 2022-02-20 DIAGNOSIS — I6523 Occlusion and stenosis of bilateral carotid arteries: Secondary | ICD-10-CM

## 2022-02-20 NOTE — Progress Notes (Signed)
Patient name: Timothy Golden MRN: [id number] DOB: 1966-05-12 Sex: male  REASON FOR VISIT:     Post op  HISTORY OF PRESENT ILLNESS:   Timothy Golden is a 56 y.o. male, who I was consulted on in April 2022, when the patient presented with chest pain and was diagnosed with a NSTEMI.  He was scheduled for cardiac surgery but his preoperative carotid duplex showed significant bilateral stenosis, left greater than right.  I sent him for CT scan to better evaluate his disease and this only indicated a 70% left-sided stenosis and 60% right.  Therefore, no carotid intervention was performed.  He went on to have CABG.  The patient has a history of stroke in the remote past.  However, this happened around the time of the trauma with a bungee cord injury to his neck.  His symptoms were mainly dizziness.  He is currently without symptoms of numbness or weakness in either extremity, slurred speech, or amaurosis fugax.  On follow-up surveillance imaging, he was found to have progression of the left-sided stenosis, greater than 80%.  On 02/01/2022, he underwent left carotid endarterectomy with patch angioplasty.  Intraoperative findings included an 80% stenosis.  His postoperative course was uncomplicated.  He was discharged home on postoperative day 1.  He is back today without complaints  The patient is a diabetic.  He takes a statin for hypercholesterolemia.  He is medically managed for hypertension.  He is a non-smoker  CURRENT MEDICATIONS:    Current Outpatient Medications  Medication Sig Dispense Refill   acetaminophen (TYLENOL) 325 MG tablet Take 2 tablets (650 mg total) by mouth every 6 (six) hours as needed for mild pain.     allopurinol (ZYLOPRIM) 100 MG tablet Take 1 tablet (100 mg total) by mouth daily. 90 tablet 3   amLODipine (NORVASC) 10 MG tablet Take 1 tablet (10 mg total) by mouth daily. 90 tablet 3   aspirin 81 MG EC tablet Take 81 mg by mouth daily.      atorvastatin (LIPITOR) 80 MG tablet Take 1 tablet (80 mg total) by mouth daily. 90 tablet 3   carvedilol (COREG) 25 MG tablet Take 1 tablet (25 mg total) by mouth 2 (two) times daily. 180 tablet 3   chlorthalidone (HYGROTON) 25 MG tablet Take 1 tablet (25 mg total) by mouth daily. 90 tablet 3   clopidogrel (PLAVIX) 75 MG tablet Take 1 tablet (75 mg total) by mouth daily. 90 tablet 3   ezetimibe (ZETIA) 10 MG tablet Take 1 tablet (10 mg total) by mouth daily. 90 tablet 3   HUMALOG 100 UNIT/ML injection Inject 30-40 Units into the skin 3 (three) times daily with meals. Sliding Scale     hydrALAZINE (APRESOLINE) 25 MG tablet Take 1 tablet (25 mg total) by mouth 3 (three) times daily. 270 tablet 3   irbesartan (AVAPRO) 300 MG tablet Take 1 tablet (300 mg total) by mouth daily. 90 tablet 3   metFORMIN (GLUCOPHAGE-XR) 500 MG 24 hr tablet Take 500 mg by mouth 2 (two) times daily.     oxyCODONE-acetaminophen (PERCOCET) 5-325 MG tablet Take 1 tablet by mouth every 6 (six) hours as needed for severe pain. 8 tablet 0   Semaglutide,0.25 or 0.5MG /DOS, 2 MG/1.5ML SOPN Inject 0.5 mg into the skin once a week. 3 mL 0   spironolactone (ALDACTONE) 25 MG tablet Take 0.5 tablets (12.5 mg total) by mouth daily. 45 tablet 3   TOUJEO MAX SOLOSTAR 300 UNIT/ML Solostar Pen Inject 35-40  Units into the skin daily.     No current facility-administered medications for this visit.    REVIEW OF SYSTEMS:   [X]  denotes positive finding, [ ]  denotes negative finding Cardiac  Comments:  Chest pain or chest pressure:    Shortness of breath upon exertion:    Short of breath when lying flat:    Irregular heart rhythm:    Constitutional    Fever or chills:      PHYSICAL EXAM:   Vitals:   02/20/22 1146 02/20/22 1148  BP: 96/65 137/77  Pulse: 65   Resp: 20   Temp: 97.9 F (36.6 C)   SpO2: 97%   Weight: 283 lb (128.4 kg)   Height: 6' (1.829 m)     GENERAL: The patient is a well-nourished male, in no acute distress.  The vital signs are documented above. CARDIOVASCULAR: There is a regular rate and rhythm. PULMONARY: Non-labored respirations Incision has healed nicely Neurologic intact STUDIES:   None   MEDICAL ISSUES:   Status post left carotid endarterectomy: The patient is doing very well at this time with no issues.  He will follow-up in 9 months with repeat ultrasound.  We discussed the possibility of early or late recurrence.  All questions were answered.  04/23/22, MD, FACS [facility] [phone] Pager [phone]

## 2022-02-23 ENCOUNTER — Other Ambulatory Visit: Payer: Self-pay

## 2022-02-23 DIAGNOSIS — I6523 Occlusion and stenosis of bilateral carotid arteries: Secondary | ICD-10-CM

## 2022-03-16 ENCOUNTER — Other Ambulatory Visit: Payer: 59

## 2022-03-16 DIAGNOSIS — I10 Essential (primary) hypertension: Secondary | ICD-10-CM

## 2022-03-16 DIAGNOSIS — E782 Mixed hyperlipidemia: Secondary | ICD-10-CM

## 2022-03-16 DIAGNOSIS — I2510 Atherosclerotic heart disease of native coronary artery without angina pectoris: Secondary | ICD-10-CM

## 2022-03-17 ENCOUNTER — Encounter: Payer: Self-pay | Admitting: Pharmacist

## 2022-03-17 LAB — LIPID PANEL
Chol/HDL Ratio: 2.5 ratio (ref 0.0–5.0)
Cholesterol, Total: 81 mg/dL — ABNORMAL LOW (ref 100–199)
HDL: 32 mg/dL — ABNORMAL LOW (ref >39)
LDL Chol Calc (NIH): 34 mg/dL (ref 0–99)
Triglycerides: 66 mg/dL (ref 0–149)
VLDL Cholesterol Cal: 15 mg/dL (ref 5–40)

## 2022-03-17 LAB — COMPREHENSIVE METABOLIC PANEL
ALT: 19 IU/L (ref 0–44)
AST: 18 IU/L (ref 0–40)
Albumin/Globulin Ratio: 2 (ref 1.2–2.2)
Albumin: 4.6 g/dL (ref 3.8–4.9)
Alkaline Phosphatase: 102 IU/L (ref 44–121)
BUN/Creatinine Ratio: 27 — ABNORMAL HIGH (ref 9–20)
BUN: 36 mg/dL — ABNORMAL HIGH (ref 6–24)
Bilirubin Total: 0.5 mg/dL (ref 0.0–1.2)
CO2: 23 mmol/L (ref 20–29)
Calcium: 9 mg/dL (ref 8.7–10.2)
Chloride: 100 mmol/L (ref 96–106)
Creatinine, Ser: 1.31 mg/dL — ABNORMAL HIGH (ref 0.76–1.27)
Globulin, Total: 2.3 g/dL (ref 1.5–4.5)
Glucose: 141 mg/dL — ABNORMAL HIGH (ref 70–99)
Potassium: 4.7 mmol/L (ref 3.5–5.2)
Sodium: 136 mmol/L (ref 134–144)
Total Protein: 6.9 g/dL (ref 6.0–8.5)
eGFR: 64 mL/min/{1.73_m2} (ref >59)

## 2022-03-17 LAB — URIC ACID: Uric Acid: 6.5 mg/dL (ref 3.8–8.4)

## 2022-03-17 LAB — APOLIPOPROTEIN B: Apolipoprotein B: 47 mg/dL (ref <90)

## 2022-04-15 ENCOUNTER — Other Ambulatory Visit: Payer: Self-pay | Admitting: Interventional Cardiology

## 2022-06-12 ENCOUNTER — Other Ambulatory Visit: Payer: Self-pay

## 2022-06-12 MED ORDER — IRBESARTAN 300 MG PO TABS: 300.0000 mg | ORAL_TABLET | Freq: Every day | ORAL | 1 refills | Status: DC

## 2022-07-22 ENCOUNTER — Other Ambulatory Visit: Payer: Self-pay | Admitting: Interventional Cardiology

## 2022-08-04 ENCOUNTER — Other Ambulatory Visit: Payer: Self-pay | Admitting: Interventional Cardiology

## 2022-08-13 ENCOUNTER — Other Ambulatory Visit: Payer: Self-pay | Admitting: Interventional Cardiology

## 2022-08-29 ENCOUNTER — Other Ambulatory Visit: Payer: Self-pay | Admitting: Interventional Cardiology

## 2022-09-17 ENCOUNTER — Other Ambulatory Visit: Payer: Self-pay | Admitting: Interventional Cardiology

## 2022-09-20 ENCOUNTER — Encounter: Payer: Self-pay | Admitting: Pharmacist

## 2022-09-27 ENCOUNTER — Other Ambulatory Visit: Payer: Self-pay | Admitting: Interventional Cardiology

## 2022-10-15 ENCOUNTER — Other Ambulatory Visit: Payer: Self-pay | Admitting: Interventional Cardiology

## 2022-11-12 ENCOUNTER — Other Ambulatory Visit: Payer: Self-pay | Admitting: Interventional Cardiology

## 2022-12-07 ENCOUNTER — Other Ambulatory Visit: Payer: Self-pay | Admitting: Interventional Cardiology

## 2022-12-08 ENCOUNTER — Other Ambulatory Visit: Payer: Self-pay | Admitting: Interventional Cardiology

## 2022-12-25 ENCOUNTER — Other Ambulatory Visit: Payer: Self-pay | Admitting: Interventional Cardiology

## 2023-01-03 ENCOUNTER — Other Ambulatory Visit: Payer: Self-pay | Admitting: Interventional Cardiology

## 2023-01-14 ENCOUNTER — Other Ambulatory Visit: Payer: Self-pay | Admitting: Interventional Cardiology

## 2023-01-25 ENCOUNTER — Other Ambulatory Visit: Payer: Self-pay | Admitting: Interventional Cardiology

## 2023-02-05 IMAGING — CT CT ANGIO NECK
2 of 7 series · 8 of 33 positions shown · IV contrast (OMNI 350)
Comparison: None available.

CLINICAL DATA: Initial evaluation for carotid stenosis screening.

EXAM:
CT ANGIOGRAPHY NECK
TECHNIQUE: Multidetector CT imaging of the neck was performed using the
standard protocol during bolus administration of intravenous
contrast. Multiplanar CT image reconstructions and MIPs were
obtained to evaluate the vascular anatomy. Carotid stenosis
measurements (when applicable) are obtained utilizing NASCET
criteria, using the distal internal carotid diameter as the
denominator.
CONTRAST:  75mL OMNIPAQUE IOHEXOL 350 MG/ML SOLN

[Series 5: cta neck · axial · 0.57mm/px · z∈[-261,-129]mm · 3 of 133 slices shown]
[im 34/133  soft-tissue]
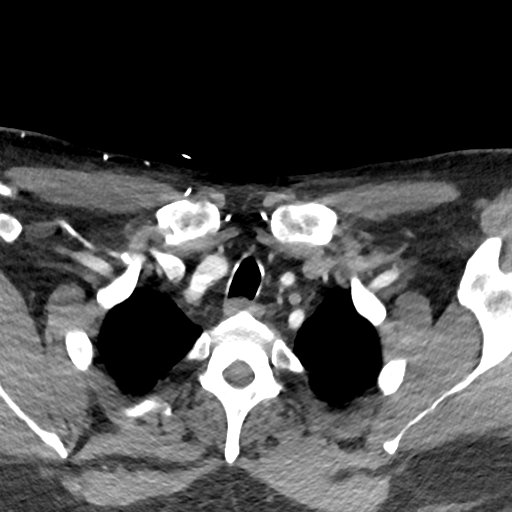
[im 67/133  soft-tissue]
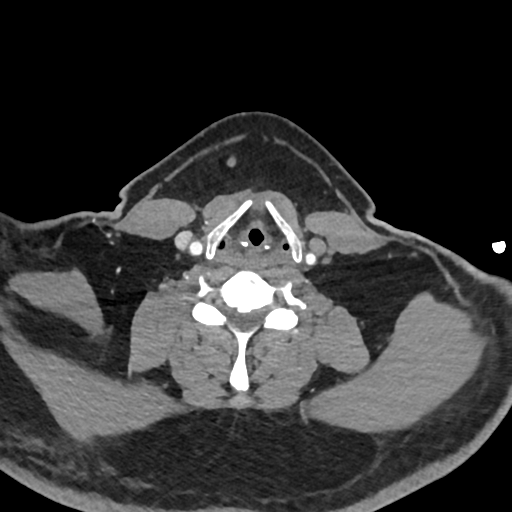
[im 100/133  soft-tissue]
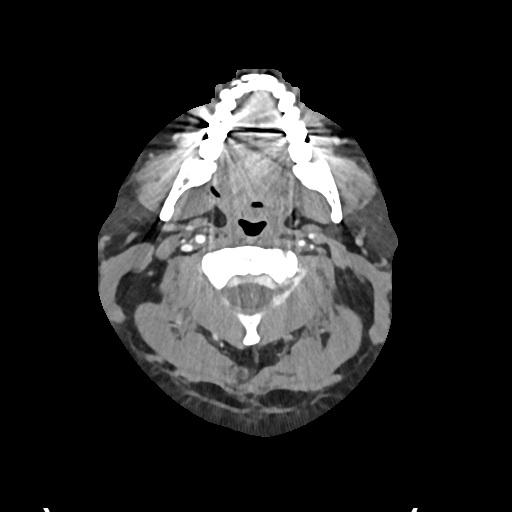

[Series 7: cta neck axial · axial · 0.50mm/px · z∈[-283,-107]mm · 5 of 264 slices shown]
[im 44/264  soft-tissue]
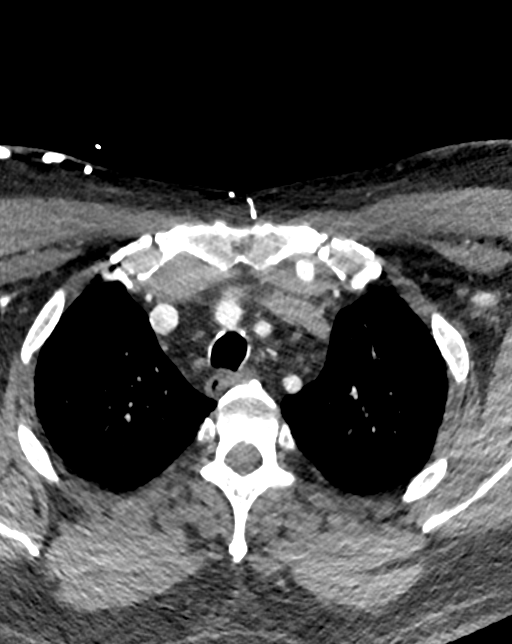
[im 88/264  bone]
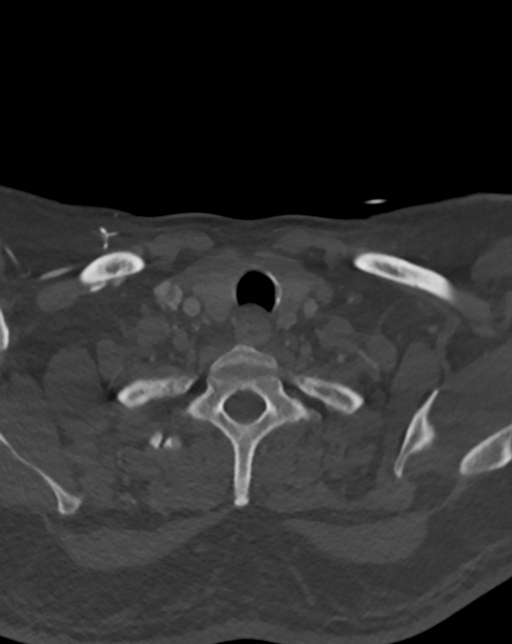
[im 132/264  soft-tissue]
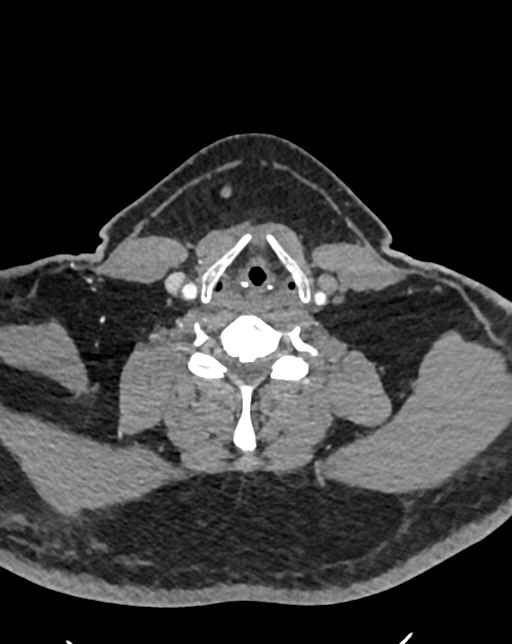
[im 176/264  bone]
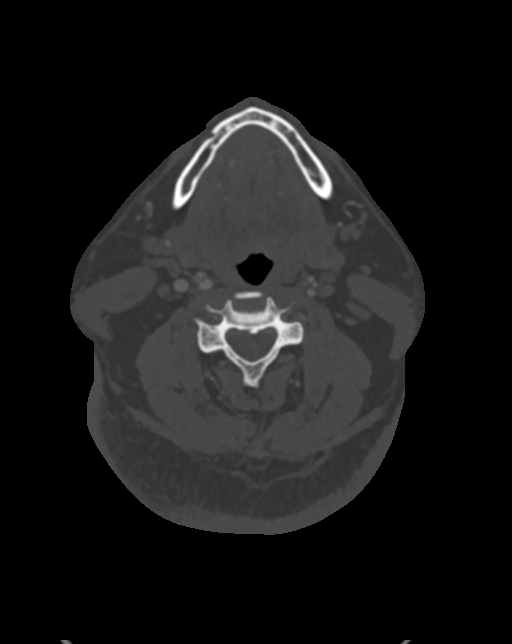
[im 220/264  soft-tissue]
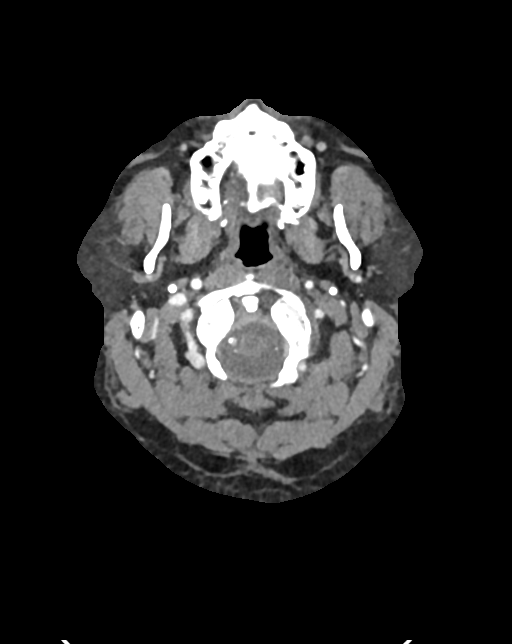

[8 of 33 positions shown; findings below may reference images not displayed]

FINDINGS: Aortic arch: Visualized aortic arch normal caliber with normal
branch pattern. Mild plaque along the undersurface of the arch
itself. No hemodynamically significant stenosis about the origin of
the great vessels. Visualized subclavian arteries widely patent. The

Right carotid system: Right CCA patent from its origin to the
bifurcation without stenosis. Mixed plaque about the right carotid
bulb/proximal right ICA with associated stenosis of up to 60% by
NASCET criteria (series 7, image 111). Additionally, there is
occlusion of the right external carotid artery at its origin with
evidence of distal reconstitution (series 7, image 106). Distally,
right ICA remains patent without stenosis, dissection, or occlusion.

Left carotid system: Left CCA patent from its origin to the
bifurcation without stenosis. Mixed concentric plaque about the left
carotid bulb/proximal left ICA with associated approximate 70%
stenosis by NASCET criteria (series 7, image 111). Severe near
occlusive stenosis noted at the origin of the left external carotid
artery as well (series 7, image 105). Distally, the left ICA is
patent without stenosis, dissection, or occlusion.

Vertebral arteries: Left vertebral artery diffusely hypoplastic and
arises directly from the aortic arch. Hypoplastic left vertebral
artery patent within the neck and appears to terminate in PICA.
Dominant right vertebral artery occludes just distal to its origin
(series 7, image 182) irregular distal reconstitution at the
proximal V2 segment. Irregular attenuated flow seen distally within
the right V2 and V3 segments with associated multifocal moderate to
severe stenoses. Right vertebral artery is patent as it courses into
the cranial vault. Additional severe proximal right V4 stenosis
noted (series 7, image 47). Additionally, the basilar artery is
markedly diminutive with multifocal severe stenoses (series 7,
images 27, 21, 12). Partially visualized SCA is are perfused. PCAs
not visualized on this exam.

Skeleton: No visible acute osseous finding. No discrete or worrisome
osseous lesions.

Other neck: No other acute soft tissue abnormality within the neck.
No mass or adenopathy.

Upper chest: Visualized upper chest demonstrates no acute finding.
IMPRESSION: 1. Atheromatous plaque about the carotid bifurcations/proximal ICAs
with associated stenoses of up to 60% on the right and 70% on the
left.
2. Occlusion of the right external carotid artery at its origin with
distal reconstitution. Additional severe near occlusive stenosis at
the origin of the left external carotid artery.
3. Left vertebral artery diffusely hypoplastic and arises directly
from the aortic arch. Dominant right vertebral artery occludes just
distal to its origin, with irregular distal reconstitution at the
proximal V2 segment. Additional moderate to severe multifocal
stenoses throughout the right V2 through V4 segments.
4. Severely diminutive basilar artery with multifocal severe
stenoses. PCAs not visualized on this exam.

## 2023-03-04 ENCOUNTER — Other Ambulatory Visit: Payer: Self-pay | Admitting: Interventional Cardiology

## 2023-03-16 ENCOUNTER — Other Ambulatory Visit: Payer: Self-pay | Admitting: Interventional Cardiology

## 2023-03-19 ENCOUNTER — Other Ambulatory Visit: Payer: Self-pay | Admitting: Interventional Cardiology

## 2023-03-25 ENCOUNTER — Other Ambulatory Visit: Payer: Self-pay | Admitting: Interventional Cardiology

## 2023-03-29 ENCOUNTER — Other Ambulatory Visit: Payer: Self-pay | Admitting: Interventional Cardiology

## 2023-04-05 NOTE — Progress Notes (Signed)
Not using any   Cardiology Office Note   Date:  04/06/2023   ID:  Timothy Golden, DOB 18-Jul-1966, MRN [id number]  PCP:  Cyril Mourning, FNP    No chief complaint on file.  CAD  Wt Readings from Last 3 Encounters:  04/06/23 280 lb 3.2 oz (127.1 kg)  02/20/22 283 lb (128.4 kg)  02/01/22 287 lb 4.8 oz (130.3 kg)       History of Present Illness: Timothy Golden is a 56 y.o. male    with a hx of HTN, HLD, DM1 and obesity who presented to the ED 12/16/2020 with chest pain.  Approximately 2 hours prior to arrival in the emergency department he had an episode of heaviness in his chest after getting up.  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  Pt underwent CABG x4 (LIMA to LAD, SVG-Diag, SVG-Ramus, SVG-OM).  Preop studies showed EF 60-65% and bilateral ICA stenosis (60-70%).  Dr. Myra Gianotti (vascular) recommended f/u carotid duplex 6 months post CABG.   Long time DM since 57 y/o.  Lost weight since CABG.  Working on getting A1C down.  8.2 in 11/2020.  Ultimately had left CEA in 01/2022.  Denies : Chest pain. Dizziness. Leg edema. Nitroglycerin use. Orthopnea. Palpitations. Paroxysmal nocturnal dyspnea. Shortness of breath. Syncope.    Walks 3x/week.    Getting routine carotid Dopplers.      Past Medical History:  Diagnosis Date   Anginal pain (HCC)    Arthritis    right shoulder   Coronary artery disease    Diabetes mellitus without complication (HCC)    HLD (hyperlipidemia)    Hypertension    Myocardial infarction (HCC)    Obesity    Stroke [facility])     Past Surgical History:  Procedure Laterality Date   CORONARY ARTERY BYPASS GRAFT N/A 12/20/2020   Procedure: CORONARY ARTERY BYPASS GRAFTING (CABG) x FOUR, USING LEFT INTERNAL MAMMARY ARTERY AND RIGHT LEG GREATER SAPHENOUS VEIN HARVESTED ENDOSCOPICALLY;  Surgeon: Alleen Borne, MD;  Location: [facility] OR;  Service: Open Heart Surgery;  Laterality: N/A;   ENDARTERECTOMY Left 02/01/2022   Procedure: LEFT CAROTID  ENDARTERECTOMY;  Surgeon: Nada Libman, MD;  Location: [facility] OR;  Service: Vascular;  Laterality: Left;   EYE SURGERY     LEFT HEART CATH AND CORONARY ANGIOGRAPHY N/A 12/16/2020   Procedure: LEFT HEART CATH AND CORONARY ANGIOGRAPHY;  Surgeon: Corky Crafts, MD;  Location: [facility] INVASIVE CV LAB;  Service: Cardiovascular;  Laterality: N/A;   PATCH ANGIOPLASTY Right 02/01/2022   Procedure: PATCH ANGIOPLASTY OF LEFT CAROTID ARTERY USING XENOSURE BOVINE PERICARDIUM PATCH;  Surgeon: Nada Libman, MD;  Location: [facility] OR;  Service: Vascular;  Laterality: Right;   SHOULDER SURGERY     TEE WITHOUT CARDIOVERSION N/A 12/20/2020   Procedure: TRANSESOPHAGEAL ECHOCARDIOGRAM (TEE);  Surgeon: Alleen Borne, MD;  Location: [facility] OR;  Service: Open Heart Surgery;  Laterality: N/A;   TONSILLECTOMY       Current Outpatient Medications  Medication Sig Dispense Refill   acetaminophen (TYLENOL) 325 MG tablet Take 2 tablets (650 mg total) by mouth every 6 (six) hours as needed for mild pain.     amLODipine (NORVASC) 10 MG tablet Take 1 tablet by mouth once daily 30 tablet 0   aspirin 81 MG EC tablet Take 81 mg by mouth daily.     atorvastatin (LIPITOR) 80 MG tablet Take 1 tablet by mouth once daily 90 tablet 0   carvedilol (COREG) 25 MG  tablet Take 1 tablet by mouth twice daily 180 tablet 2   chlorthalidone (HYGROTON) 25 MG tablet Take 1 tablet by mouth once daily 90 tablet 1   clopidogrel (PLAVIX) 75 MG tablet TAKE 1 TABLET BY MOUTH ONCE DAILY . APPOINTMENT REQUIRED FOR FUTURE REFILLS 30 tablet 0   ezetimibe (ZETIA) 10 MG tablet Take 1 tablet by mouth once daily 90 tablet 1   HUMALOG 100 UNIT/ML injection Inject 30-40 Units into the skin 3 (three) times daily with meals. Sliding Scale     hydrALAZINE (APRESOLINE) 25 MG tablet TAKE 1 TABLET BY MOUTH THREE TIMES DAILY 90 tablet 2   irbesartan (AVAPRO) 300 MG tablet Take 1 tablet by mouth once daily 90 tablet 3   metFORMIN (GLUCOPHAGE-XR) 500 MG 24 hr tablet  Take 500 mg by mouth 2 (two) times daily.     oxyCODONE-acetaminophen (PERCOCET) 5-325 MG tablet Take 1 tablet by mouth every 6 (six) hours as needed for severe pain. 8 tablet 0   Semaglutide,0.25 or 0.5MG /DOS, 2 MG/1.5ML SOPN Inject 0.5 mg into the skin once a week. 3 mL 0   spironolactone (ALDACTONE) 25 MG tablet TAKE 1/2 (ONE-HALF) TABLET BY MOUTH ONCE DAILY **PLEASE  SCHEDULE  YEARLY  APPOINTMENT  FOR  FUTURE  REFILLS.  THANK  YOU** 15 tablet 3   TOUJEO MAX SOLOSTAR 300 UNIT/ML Solostar Pen Inject 35-40 Units into the skin daily.     No current facility-administered medications for this visit.    Allergies:   Ace inhibitors and Shellfish allergy    Social History:  The patient  reports that he has never smoked. He has never used smokeless tobacco. He reports that he does not drink alcohol and does not use drugs.   Family History:  The patient's family history includes Diabetes Mellitus I in his father; Hypertension in his father and mother; Skin cancer in his father.    ROS:  Please see the history of present illness.   Otherwise, review of systems are positive for rare, mild lightheadedness with position changes.   All other systems are reviewed and negative.    PHYSICAL EXAM: VS:  BP 128/66   Pulse 66   Ht 6' (1.829 m)   Wt 280 lb 3.2 oz (127.1 kg)   SpO2 97%   BMI 38.00 kg/m  , BMI Body mass index is 38 kg/m. GEN: Well nourished, well developed, in no acute distress HEENT: normal Neck: no JVD, carotid bruits, or masses Cardiac: RRR; no murmurs, rubs, or gallops,no edema  Respiratory:  clear to auscultation bilaterally, normal work of breathing GI: soft, nontender, nondistended, + BS MS: no deformity or atrophy Skin: warm and dry, no rash Neuro:  Strength and sensation are intact Psych: euthymic mood, full affect   EKG:   The ekg ordered today demonstrates NSR, ST changes from 2022 have resolved   Recent Labs: No results found for requested labs within last 365  days.   Lipid Panel    Component Value Date/Time   CHOL 81 (L) 03/16/2022 0740   TRIG 66 03/16/2022 0740   HDL 32 (L) 03/16/2022 0740   CHOLHDL 2.5 03/16/2022 0740   CHOLHDL 4.6 12/17/2020 0739   VLDL 19 12/17/2020 0739   LDLCALC 34 03/16/2022 0740     Other studies Reviewed: Additional studies/ records that were reviewed today with results demonstrating: labs reviewed.  Cr 1.14, A1c 6.9 in 2024   ASSESSMENT AND PLAN:  CAD: s/p CABG. No angina.  No bleeding with aspirin  and Plavix.  Had colon polyps.   HTN: The current medical regimen is effective;  continue present plan and medications. Hyperlipidemia: Need fasting lipids.  Sinew atorvastatin 80.  Whole food plant-based diet.High fiber diet. Avoid processed food  DM1: Exercise  recs given including some light weights. Carotid artery disease: s/p L CEA.  Routine carotid Doppler.   Current medicines are reviewed at length with the patient today.  The patient concerns regarding his medicines were addressed.  The following changes have been made:  No change  Labs/ tests ordered today include:   Orders Placed This Encounter  Procedures   EKG 12-Lead    Recommend 150 minutes/week of aerobic exercise Low fat, low carb, high fiber diet recommended  Disposition:   FU in 1 year with an interventionalist- names given Dr. Lynnette Caffey, Dr. Herbie Baltimore or Dr. Floy Sabina.   Signed, Lance Muss, MD  04/06/2023 4:06 PM    [facility] [address] [place], [place], [place]  [address] Phone: [phone]; Fax: [phone]

## 2023-04-06 ENCOUNTER — Ambulatory Visit: Payer: 59 | Attending: Interventional Cardiology | Admitting: Interventional Cardiology

## 2023-04-06 ENCOUNTER — Encounter: Payer: Self-pay | Admitting: Interventional Cardiology

## 2023-04-06 VITALS — BP 128/66 | HR 66 | Ht 72.0 in | Wt 280.2 lb

## 2023-04-06 DIAGNOSIS — I2510 Atherosclerotic heart disease of native coronary artery without angina pectoris: Secondary | ICD-10-CM | POA: Diagnosis not present

## 2023-04-06 DIAGNOSIS — E1069 Type 1 diabetes mellitus with other specified complication: Secondary | ICD-10-CM

## 2023-04-06 DIAGNOSIS — I10 Essential (primary) hypertension: Secondary | ICD-10-CM

## 2023-04-06 DIAGNOSIS — Z951 Presence of aortocoronary bypass graft: Secondary | ICD-10-CM

## 2023-04-06 DIAGNOSIS — Z7984 Long term (current) use of oral hypoglycemic drugs: Secondary | ICD-10-CM

## 2023-04-06 DIAGNOSIS — E782 Mixed hyperlipidemia: Secondary | ICD-10-CM | POA: Diagnosis not present

## 2023-04-06 DIAGNOSIS — I6529 Occlusion and stenosis of unspecified carotid artery: Secondary | ICD-10-CM

## 2023-04-06 NOTE — Patient Instructions (Signed)
Medication Instructions:  Your physician recommends that you continue on your current medications as directed. Please refer to the Current Medication list given to you today.  *If you need a refill on your cardiac medications before your next appointment, please call your pharmacy*   Lab Work: Have fasting lipid and liver profiles checked at your primary care office.  Ask for results to be faxed to our office. Fax is [phone] If you have labs (blood work) drawn today and your tests are completely normal, you will receive your results only by: MyChart Message (if you have MyChart) OR A paper copy in the mail If you have any lab test that is abnormal or we need to change your treatment, we will call you to review the results.   Testing/Procedures: none   Follow-Up: At [facility], you and your health needs are our priority.  As part of our continuing mission to provide you with exceptional heart care, we have created designated Provider Care Teams.  These Care Teams include your primary Cardiologist (physician) and Advanced Practice Providers (APPs -  Physician Assistants and Nurse Practitioners) who all work together to provide you with the care you need, when you need it.  We recommend signing up for the patient portal called "MyChart".  Sign up information is provided on this After Visit Summary.  MyChart is used to connect with patients for Virtual Visits (Telemedicine).  Patients are able to view lab/test results, encounter notes, upcoming appointments, etc.  Non-urgent messages can be sent to your provider as well.   To learn more about what you can do with MyChart, go to [email or web address].    Your next appointment:   12 month(s)  Provider:   Dr Clifton James, Dr Lynnette Caffey, Dr Herbie Baltimore, Dr [place]     Other Instructions

## 2023-04-15 ENCOUNTER — Other Ambulatory Visit: Payer: Self-pay | Admitting: Interventional Cardiology

## 2023-04-16 ENCOUNTER — Other Ambulatory Visit: Payer: Self-pay | Admitting: Interventional Cardiology

## 2023-04-25 ENCOUNTER — Other Ambulatory Visit: Payer: Self-pay | Admitting: Interventional Cardiology

## 2023-04-27 ENCOUNTER — Other Ambulatory Visit: Payer: Self-pay | Admitting: Interventional Cardiology

## 2023-05-03 ENCOUNTER — Other Ambulatory Visit: Payer: Self-pay | Admitting: Interventional Cardiology

## 2023-05-05 ENCOUNTER — Other Ambulatory Visit: Payer: Self-pay | Admitting: Interventional Cardiology

## 2023-06-03 ENCOUNTER — Other Ambulatory Visit: Payer: Self-pay | Admitting: Interventional Cardiology

## 2023-06-13 ENCOUNTER — Other Ambulatory Visit: Payer: Self-pay | Admitting: Interventional Cardiology

## 2023-06-23 ENCOUNTER — Other Ambulatory Visit: Payer: Self-pay | Admitting: Interventional Cardiology

## 2023-08-01 ENCOUNTER — Other Ambulatory Visit: Payer: Self-pay | Admitting: Interventional Cardiology

## 2023-08-11 ENCOUNTER — Other Ambulatory Visit: Payer: Self-pay | Admitting: Interventional Cardiology

## 2023-11-21 ENCOUNTER — Other Ambulatory Visit: Payer: Self-pay | Admitting: Interventional Cardiology

## 2023-11-30 ENCOUNTER — Other Ambulatory Visit: Payer: Self-pay | Admitting: Pharmacist

## 2024-03-01 ENCOUNTER — Other Ambulatory Visit: Payer: Self-pay | Admitting: Interventional Cardiology

## 2024-03-01 ENCOUNTER — Other Ambulatory Visit: Payer: Self-pay | Admitting: Physician Assistant

## 2024-04-09 ENCOUNTER — Other Ambulatory Visit: Payer: Self-pay | Admitting: Physician Assistant

## 2024-04-11 MED ORDER — ATORVASTATIN CALCIUM 80 MG PO TABS: 80.0000 mg | ORAL_TABLET | Freq: Every day | ORAL | 0 refills | Status: AC

## 2024-04-20 ENCOUNTER — Other Ambulatory Visit: Payer: Self-pay | Admitting: Interventional Cardiology

## 2024-04-22 ENCOUNTER — Other Ambulatory Visit: Payer: Self-pay

## 2024-04-22 MED ORDER — AMLODIPINE BESYLATE 10 MG PO TABS: 10.0000 mg | ORAL_TABLET | Freq: Every day | ORAL | 0 refills | Status: DC

## 2024-04-30 ENCOUNTER — Other Ambulatory Visit: Payer: Self-pay | Admitting: Interventional Cardiology

## 2024-05-01 ENCOUNTER — Telehealth: Payer: Self-pay

## 2024-05-01 ENCOUNTER — Other Ambulatory Visit: Payer: Self-pay | Admitting: Interventional Cardiology

## 2024-05-01 ENCOUNTER — Other Ambulatory Visit: Payer: Self-pay | Admitting: Cardiovascular Disease

## 2024-05-01 NOTE — Telephone Encounter (Signed)
 [facility] pharmacy stating that medication spironolactone  25 mg tablet has a major drug interaction with medication irbesartan . May cause cardiac arrhythmia, hyperkalemia risk. Would provider still like to dispense medication? Please address

## 2024-05-02 NOTE — Telephone Encounter (Signed)
 Last K normal, ok to fill.  Of note, patient needs yearly appt scheduled

## 2024-05-02 NOTE — Telephone Encounter (Signed)
 Called pharmacy and clarified information and pharmacy is getting pt's medication ready for pt to pick up, asking pt to make overdue appt as well.

## 2024-05-04 ENCOUNTER — Other Ambulatory Visit: Payer: Self-pay | Admitting: Interventional Cardiology

## 2024-05-05 ENCOUNTER — Other Ambulatory Visit: Payer: Self-pay | Admitting: Physician Assistant

## 2024-05-05 MED ORDER — CHLORTHALIDONE 25 MG PO TABS: 25.0000 mg | ORAL_TABLET | Freq: Every day | ORAL | 0 refills | Status: DC

## 2024-05-06 NOTE — Progress Notes (Signed)
 Per Libre BG reading is 88. A1c obtained today Electronically signed by: Bascom Rosaline Dimes, CMA 05/06/2024 3:10 PM

## 2024-05-06 NOTE — Progress Notes (Signed)
 DIABETES AND ENDOCRINOLOGY CENTER FOLLOW UP VISIT   CC/Reason for visit: Type 1 Diabetes Mellitus  HPI:  This is a 58 y.o. male who presents for a follow up visit.  The following issues were addressed in clinic today:   ASSESSMENT/PLAN: Timothy Golden is a 58 y.o. male with a past medical history of above who presents for a follow up consultation visit.   Assessment & Plan 1. Type 1 diabetes A1c goal for this patient is <7% without severe or recurrent hypoglycemia. Increased insulin  requirements, particularly for mealtime insulin . Base dose increased from 20 to 25 units up to 30 units. Higher glucose variability and need for more frequent corrections. A1c trending up slightly at 7.5%. Patient frustrated with trends. Significant glucose variability on CGM review. GMI 7.6%. No clearly identifiable patterns. Plan: - Continue current insulin  regimen while maintaining current dosing. - Continue Freestyle Libre 3 CGM. - Patient agreeable to track insulin  delivery in [place] app for next week to evaluate insulin  delivery trends alongside blood sugar trends for dose adjustment recommendations. - Discussed possibility of increasing semaglutide dose but patient prefers to wait until blood sugar trends are reviewed.  2. Insulin  resistance and obesity 3. Metabolic syndrome Successful weight loss of over 30 pounds with supplemental medications. Decreased insulin  requirements observed with Ozempic use.  Plan: - Continue Ozempic and metformin . - Ozempic recommended due to significant resistance, successful weight loss, and decreased insulin  requirements. - Renewal of prior authorization due in 06/2024. - Discussed possible transition to [facility] pending insurance approval.  There are no Patient Instructions on file for this visit.  Orders Placed This Encounter  Medications  . HumaLOG KwikPen Insulin  100 unit/mL KwikPen    Sig: Inject 30 units into skin 3 times daily before meals. TDD 90 units     Dispense:  81 mL    Refill:  2  . insulin  glargine U-300 (Toujeo  Max U-300 SoloStar) 300 unit/mL (3 mL) pen pen    Sig: INJECT 90 UNITS SUBCUTANEOUSLY ONCE DAILY. MAY TITRATE UP TO 120 UNITS Strength: 300 unit/mL (3 mL)    Dispense:  24 mL    Refill:  3  . metFORMIN  (GLUCOPHAGE -XR) 500 mg 24 hr tablet    Sig: Take 2 tablets (1,000 mg total) by mouth in the morning and 2 tablets (1,000 mg total) in the evening. Take with meals.    Dispense:  120 tablet    Refill:  5  . semaglutide (Ozempic) 1 mg/dose (4 mg/3 mL) subcutaneous pen injector    Sig: Inject 1 mg under the skin once a week.    Dispense:  9 mL    Refill:  3    Inject 1 mg into the skin once a week.  . FreeStyle Libre 3 Plus Sensor    Sig: Replace every 15 days    Dispense:  6 each    Refill:  3    Okay to fill libre 3 or 3 plus based on availability   Orders Placed This Encounter  Procedures  . POC Hemoglobin A1c    No follow-ups on file.  Electronically signed by: Elveria Odetta Boop, PA-C 05/06/2024 3:59 PM DAX Copilot tool used for dictation of note for today's encounter. Ambient listening not used during visit.      1. Type 1 Diabetes Mellitus:   History of Present Illness The patient is a 58 year old male presenting to the endocrinology clinic for follow-up evaluation of type 1 diabetes, currently managed on MDI with Toujeo  and NovoLog , monitoring  blood sugars with Libre. He is also supplementing his condition with Ozempic and metformin  to address persistent insulin  resistance and obesity.  He has lost and maintained over 30 pounds of weight loss with the support of the supplemental medications and is requiring less insulin  compared to baseline. Since the last visit, he has noticed an overall increase in insulin  requirements, particularly focused on mealtime insulin . He has increased his typical base dose from 20 to 25 units up to 30 units and has made a slight adjustment in his Toujeo . Despite consistent eating  habits and no change in physical activity, he is noticing higher glucose variability and the need for more frequent corrections. A1c continues trending up slightly at 7.5% today. He is becoming very frustrated with these trends as he cannot identify any cause or solution.  At his last visit, the possible use of an insulin  pump was discussed. However, after exploring several systems, he is unable to afford any options at this time. On CGM review, there is a pattern of significant variability with a GMI of 7.6%. There are no clearly identifiable patterns making it difficult to advise on changes.  The patient currently uses the following regimen:  Injectable: Basal insulin :   Toujeo  30 units on syringe  (90 units daily) Bolus insulin :   NovoLog  30 units three times daily before meals plus correction 10:100 >140 mg/dl) Other           :   Ozempic 1.0 mg weekly   Non-insulin : Metformin  XR 1000 mg BID   Blood Glucose Monitoring: Patient is using a Continuous Glucose Monitor.  I have downloaded and reviewed the CGM  CGM REPORT  INDICATIONS: Diabetes Mellitus   3 or more days of tracings and data reviewed  Overall glucose variability is high  GMI is 7.6%  48% of values are with hyperglycemia above goal (>180 mg/dL) 50% of values are within goal for blood glucose 70-180 mg/dL 3% of values with hypoglycemia (<70 mg/dL)  RECOMMENDATIONS:   High glucose variability with no discernable pattern. Recommend logging insulin  doses for 1 week to compare glucose trends to insulin  dosing and timing to better advise on changes.   Electronically signed by: Elveria Odetta Boop, PA-C 05/06/2024 3:59 PM  Review of Systems: A complete ROS was obtained with pertinent positives/negatives noted in the HPI. The remained of the ROS are negative.  Objective: BP 124/74   Pulse 64   Temp 98.2 F (36.8 C)   Ht 1.803 m (5' 11)   Wt 130 kg (286 lb 4.8 oz)   SpO2 97%   BMI 39.93 kg/m   Physical  Exam: Physical Exam General Appearance: The patient is an obese male. He is in no acute distress. Head: Normocephalic and atraumatic. Eyes: Pupils equally round; no proptosis, lid lag, or stare; vision grossly intact. Thyroid: no visible goiter. Cardiovascular: regular rate. Respiratory: Normal respiratory effort; not respiratory distress. Abdominal: Non-distended. Musculoskeletal: no deficits noted. Extremities: No edema noted. Skin: Warm and dry, no rash on exposed skin. Neurological: Alert, aware, answers questions appropriately; no tremor. Psychiatric: Pleasant mood, behavior normal.   Pertinent Labs: Results Labs:  A1c is 7.5%.  Continuous Glucose Monitoring (CGM): - Glucose variability: High glucose variability - Other: GMI is 7.6%.    Chemistry      Component Value Date/Time   NA 141 01/03/2023 1647   K 4.2 01/03/2023 1647   CL 107 01/03/2023 1647   CO2 27 01/03/2023 1647   BUN 25 01/03/2023 1647  CREATININE 1.14 01/03/2023 1647      Component Value Date/Time   CALCIUM  9.8 01/03/2023 1647   AST 17 01/07/2020 1009   ALT 20 01/07/2020 1009   BILITOT 0.6 01/07/2020 1009       Lab Results  Component Value Date   CHOL 139 12/16/2019   TRIG 85 12/16/2019   HDL 37 12/16/2019    Lab Results  Component Value Date   POCA1C 7.5 (A) 05/06/2024   HGBA1C 7.2 (A) 08/23/2022   HGBA1C 7.4 (A) 09/28/2020   HGBA1C 7.8 (H) 01/07/2020   HGBA1C 7.8 (H) 01/07/2020    No components found for: UALBGCRT  No results found for: TSH  No components found for: VB12  Lab Results  Component Value Date   CPEPTIDE 0.1 (L) 01/03/2023

## 2024-05-06 NOTE — Telephone Encounter (Signed)
 Medication Access Center Summary  PA Status  PA not submitted.  Awaiting further info  Medication FREESTYLE 3 is non-preferred on insurance formulary  Provider [employer] [place]  Preferred Alternatives DEXCOM   Number of alternatives that must be tried 1-I saw that he was using the dexcom but I can't find why he switched or I over looked it in the OV notes    Prior Authorization will require specific clinical rationale as to why they cannot try the others.  Please advise on how you want to proceed.

## 2024-05-06 NOTE — Progress Notes (Signed)
 Libre app data report downloaded into chart. Provider aware. Electronically signed by: Laymon Drusilla Seed, LPN 0/83/7974 6:96 PM

## 2024-05-11 NOTE — Progress Notes (Signed)
 Cardiology Office Note   Date:  05/12/2024  ID:  Timothy Golden, Timothy Golden [birth year], MRN [id number] PCP: Mavis Redge SAILOR, FNP  [facility] Providers Cardiologist:  Candyce Reek, MD     History of Present Illness Timothy Golden is a 58 y.o. male with a past medical history of hypertension, hyperlipidemia, obesity, type 1 diabetes, hypertension, carotid artery stenosis s/p left CEA 01/2022, CAD and NSTEMI s/p CABG x 4 (LIMA to LAD, SVG to diagonal, SVG to ramus, SVG to OM-11/2020)  He states that he has been told he snores at night and has stopped breathing per his wife but this seems to have improved over time.  He is not tired.  No official diagnosis of sleep apnea.  He is able to walk about 1 mile per day x 5 days which is broken up throughout the day without any exertional symptoms otherwise he is doing well without any complaints.  Tolerating his medication   ROS:  Review of Systems  All other systems reviewed and are negative.   Physical Exam  Physical Exam Vitals and nursing note reviewed.  Constitutional:      Appearance: Normal appearance.  HENT:     Head: Normocephalic and atraumatic.  Eyes:     Conjunctiva/sclera: Conjunctivae normal.  Neck:     Vascular: No carotid bruit.  Cardiovascular:     Rate and Rhythm: Normal rate and regular rhythm.  Pulmonary:     Effort: Pulmonary effort is normal.     Breath sounds: Normal breath sounds.  Musculoskeletal:        General: No swelling or tenderness.  Skin:    Coloration: Skin is not jaundiced or pale.  Neurological:     Mental Status: He is alert.     VS:  BP 121/75 (BP Location: Right Arm, Patient Position: Sitting)   Pulse 66   Ht 6' (1.829 m)   Wt 284 lb 2 oz (128.9 kg)   SpO2 97%   BMI 38.53 kg/m         Wt Readings from Last 3 Encounters:  05/12/24 284 lb 2 oz (128.9 kg)  04/06/23 280 lb 3.2 oz (127.1 kg)  02/20/22 283 lb (128.4 kg)     EKG Interpretation Normal sinus rhythm,  first-degree AV block, anteroseptal infarct, similar to prior      Studies Reviewed   ECHO/29/22: EF 60 to 65% with no regional wall motion abnormalities Grade 2 diastolic dysfunction with elevated filling pressures Moderately dilated left atrium Mildly dilated ascending aorta   Left heart catheterization 12/16/20: Lida LAD to Prox LAD lesion is 100% stenosed. Right to left collaterals are present. Dist LM lesion is 60% stenosed. Ramus lesion is 75% stenosed. Ost Cx lesion is 60% stenosed. The left ventricular ejection fraction is 35-45% by visual estimate. There is mild to moderate left ventricular systolic dysfunction. LV end diastolic pressure is mildly elevated. There is no aortic valve stenosis.  Risk Assessment/Calculations        STOP-Bang Score:  7        ASSESSMENT  CAD and NSTEMI s/p CABG x 4 (LIMA to LAD, SVG to diagonal, SVG to ramus, SVG to OM-11/2020) stable on Aspirin , Plavix , atorvastatin  80 mg, zetia  10 mg Hypertension stable on carvedilol  25 mg twice daily, chlorthalidone  25 mg, amlodipine  10 mg, spironolactone  25 mg, irbesartan  300 mg, hydralazine  25 mg 3 times daily Hyperlipidemia well-controlled per care everywhere labs on atorvastatin  and Zetia  Type 1 Diabetes  Bilateral Carotid Artery stenosis  s/p left CEA 01/2022  Dilated ascending aorta by echo Suspected sleep apnea   Plan  Echo to follow-up on his ascending aorta He has declined sleep study at this time Cardiac risk counseling and prevention recommendations: Heart healthy/Mediterranean diet with whole grains, fruits, vegetable, fish, lean meats, nuts, and olive oil. Limit salt. Moderate walking, 3-5 times/week for 30-50 minutes each session. Aim for at least 150 minutes.week. Goal should be pace of 3 miles/hour, or walking 1.5 miles in 30 minutes Avoidance of tobacco products. Avoid excess alcohol.  Follow up: 1 year          Signed, Emeline FORBES Calender, MD

## 2024-05-12 ENCOUNTER — Encounter: Payer: Self-pay | Admitting: Internal Medicine

## 2024-05-12 ENCOUNTER — Ambulatory Visit: Payer: Self-pay | Attending: Internal Medicine | Admitting: Internal Medicine

## 2024-05-12 VITALS — BP 121/75 | HR 66 | Ht 72.0 in | Wt 284.1 lb

## 2024-05-12 DIAGNOSIS — I214 Non-ST elevation (NSTEMI) myocardial infarction: Secondary | ICD-10-CM

## 2024-05-12 DIAGNOSIS — Z951 Presence of aortocoronary bypass graft: Secondary | ICD-10-CM

## 2024-05-12 DIAGNOSIS — I6523 Occlusion and stenosis of bilateral carotid arteries: Secondary | ICD-10-CM | POA: Diagnosis not present

## 2024-05-12 DIAGNOSIS — I10 Essential (primary) hypertension: Secondary | ICD-10-CM

## 2024-05-12 DIAGNOSIS — I2510 Atherosclerotic heart disease of native coronary artery without angina pectoris: Secondary | ICD-10-CM | POA: Diagnosis not present

## 2024-05-12 DIAGNOSIS — E1069 Type 1 diabetes mellitus with other specified complication: Secondary | ICD-10-CM

## 2024-05-12 DIAGNOSIS — I77810 Thoracic aortic ectasia: Secondary | ICD-10-CM

## 2024-05-12 NOTE — Patient Instructions (Addendum)
 Medication Instructions:  Your physician recommends that you continue on your current medications as directed. Please refer to the Current Medication list given to you today.  *If you need a refill on your cardiac medications before your next appointment, please call your pharmacy*  Lab Work: None ordered.  You may go to any [facility] Location for your lab work:  [facility] - [address] ([facility]) - [address] [phone] [address]  [place] - [address]   [place]  - [address]   [place] - [address] - [address] [employer]  - [address] [place] - [address] ([facility]   If you have labs (blood work) drawn today and your tests are completely normal, you will receive your results only by: [employer] (if you have MyChart)  If you have any lab test that is abnormal or we need to change your treatment, we will call you or send a MyChart message to review the results.  Testing/Procedures: Your physician has requested that you have an echocardiogram. Echocardiography is a painless test that uses sound waves to create images of your heart. It provides your doctor with information about the size and shape of your heart and how well your heart's chambers and valves are working. This procedure takes approximately one hour. There are no restrictions for this procedure. Please do NOT wear cologne, perfume, aftershave, or lotions (deodorant is allowed). Please arrive 15 minutes prior to your appointment time.  Please note: We ask at that you not bring children with you during ultrasound (echo/ vascular) testing. Due to room size and safety concerns, children are not allowed in the ultrasound rooms during exams. Our front office staff cannot provide observation of children in our lobby area while testing is being conducted. An adult accompanying a patient  to their appointment will only be allowed in the ultrasound room at the discretion of the ultrasound technician under special circumstances. We apologize for any inconvenience.   Follow-Up: At [facility], you and your health needs are our priority.  As part of our continuing mission to provide you with exceptional heart care, we have created designated Provider Care Teams.  These Care Teams include your primary Cardiologist (physician) and Advanced Practice Providers (APPs -  Physician Assistants and Nurse Practitioners) who all work together to provide you with the care you need, when you need it.  Your next appointment:   1 year(s)  The format for your next appointment:   In Person  Provider:   Emeline Calender, DO

## 2024-05-23 NOTE — Progress Notes (Signed)
 Order(s) created erroneously. Erroneous order ID: [id number]  Order moved by: CHART CORRECTION ANALYST NINE, IDENTITY  Order move date/time: 05/23/2024 9:11 AM  Source Patient: [id number]  Source Contact: 05/12/2024  Destination Patient: [id number]  Destination Contact: 01/31/2023

## 2024-05-27 ENCOUNTER — Other Ambulatory Visit: Payer: Self-pay | Admitting: Interventional Cardiology

## 2024-05-27 ENCOUNTER — Encounter: Payer: Self-pay | Admitting: Internal Medicine

## 2024-05-27 MED ORDER — EZETIMIBE 10 MG PO TABS: 10.0000 mg | ORAL_TABLET | Freq: Every day | ORAL | 3 refills | Status: AC

## 2024-05-27 MED ORDER — CARVEDILOL 25 MG PO TABS: 25.0000 mg | ORAL_TABLET | Freq: Two times a day (BID) | ORAL | 3 refills | Status: AC

## 2024-05-27 MED ORDER — CLOPIDOGREL BISULFATE 75 MG PO TABS: 75.0000 mg | ORAL_TABLET | Freq: Every day | ORAL | 0 refills | Status: DC

## 2024-05-27 NOTE — Addendum Note (Signed)
 Addended by: THEOTIS SHARLET PARAS on: 05/27/2024 05:55 PM   Modules accepted: Orders

## 2024-05-30 MED ORDER — CLOPIDOGREL BISULFATE 75 MG PO TABS: 75.0000 mg | ORAL_TABLET | Freq: Every day | ORAL | 3 refills | Status: AC

## 2024-05-30 NOTE — Addendum Note (Signed)
 Addended by: DRENA MARTINIS, Arlester Keehan L on: 05/30/2024 04:18 PM   Modules accepted: Orders

## 2024-06-06 MED ORDER — SPIRONOLACTONE 25 MG PO TABS: 12.5000 mg | ORAL_TABLET | Freq: Once | ORAL | 3 refills | Status: AC

## 2024-06-06 NOTE — Addendum Note (Signed)
 Addended by: CHAUVIGNE, Nelly Scriven on: 06/06/2024 09:09 AM   Modules accepted: Orders

## 2024-06-16 ENCOUNTER — Ambulatory Visit (HOSPITAL_COMMUNITY)
Admission: RE | Admit: 2024-06-16 | Discharge: 2024-06-16 | Disposition: A | Discharge status: Home / Self Care | Source: Ambulatory Visit | Attending: Cardiology | Admitting: Cardiology

## 2024-06-16 DIAGNOSIS — I10 Essential (primary) hypertension: Secondary | ICD-10-CM

## 2024-06-16 DIAGNOSIS — I77810 Thoracic aortic ectasia: Secondary | ICD-10-CM | POA: Diagnosis present

## 2024-06-16 DIAGNOSIS — I2510 Atherosclerotic heart disease of native coronary artery without angina pectoris: Secondary | ICD-10-CM

## 2024-06-16 LAB — ECHOCARDIOGRAM COMPLETE
Area-P 1/2: 4.1 cm2
S' Lateral: 3.3 cm

## 2024-06-16 MED ORDER — PERFLUTREN LIPID MICROSPHERE
1.0000 mL | INTRAVENOUS | Status: AC | PRN
  Administered 2024-06-16: 2 mL via INTRAVENOUS

## 2024-06-19 ENCOUNTER — Ambulatory Visit: Payer: Self-pay | Admitting: Internal Medicine

## 2024-07-13 ENCOUNTER — Other Ambulatory Visit: Payer: Self-pay | Admitting: Internal Medicine

## 2024-08-16 ENCOUNTER — Encounter: Payer: Self-pay | Admitting: Internal Medicine

## 2024-08-18 ENCOUNTER — Other Ambulatory Visit: Payer: Self-pay

## 2024-08-18 MED ORDER — CHLORTHALIDONE 25 MG PO TABS: 25.0000 mg | ORAL_TABLET | Freq: Every day | ORAL | 3 refills | Status: AC

## 2024-08-18 NOTE — Telephone Encounter (Signed)
 Refill sent to pharmacy.
